# Patient Record
Sex: Male | Born: 1940 | Race: White | Hispanic: No | Marital: Married | Smoking: Never smoker
Health system: Southern US, Community
[De-identification: ages and names within clinical notes are randomized; demographics above are authoritative.]

## PROBLEM LIST (undated history)

## (undated) DIAGNOSIS — D62 Acute posthemorrhagic anemia: Secondary | ICD-10-CM

## (undated) DIAGNOSIS — I219 Acute myocardial infarction, unspecified: Secondary | ICD-10-CM

## (undated) DIAGNOSIS — E785 Hyperlipidemia, unspecified: Secondary | ICD-10-CM

## (undated) DIAGNOSIS — D649 Anemia, unspecified: Secondary | ICD-10-CM

## (undated) DIAGNOSIS — J45901 Unspecified asthma with (acute) exacerbation: Secondary | ICD-10-CM

## (undated) DIAGNOSIS — I472 Ventricular tachycardia: Secondary | ICD-10-CM

## (undated) DIAGNOSIS — I25110 Atherosclerotic heart disease of native coronary artery with unstable angina pectoris: Secondary | ICD-10-CM

## (undated) DIAGNOSIS — I469 Cardiac arrest, cause unspecified: Secondary | ICD-10-CM

## (undated) DIAGNOSIS — K439 Ventral hernia without obstruction or gangrene: Secondary | ICD-10-CM

## (undated) DIAGNOSIS — I255 Ischemic cardiomyopathy: Secondary | ICD-10-CM

## (undated) DIAGNOSIS — F0280 Dementia in other diseases classified elsewhere without behavioral disturbance: Secondary | ICD-10-CM

## (undated) DIAGNOSIS — J441 Chronic obstructive pulmonary disease with (acute) exacerbation: Secondary | ICD-10-CM

## (undated) DIAGNOSIS — K219 Gastro-esophageal reflux disease without esophagitis: Secondary | ICD-10-CM

## (undated) DIAGNOSIS — S2243XA Multiple fractures of ribs, bilateral, initial encounter for closed fracture: Secondary | ICD-10-CM

## (undated) DIAGNOSIS — I4721 Torsades de pointes: Secondary | ICD-10-CM

## (undated) DIAGNOSIS — K922 Gastrointestinal hemorrhage, unspecified: Secondary | ICD-10-CM

## (undated) DIAGNOSIS — F5104 Psychophysiologic insomnia: Secondary | ICD-10-CM

## (undated) DIAGNOSIS — C649 Malignant neoplasm of unspecified kidney, except renal pelvis: Secondary | ICD-10-CM

## (undated) DIAGNOSIS — N179 Acute kidney failure, unspecified: Secondary | ICD-10-CM

## (undated) DIAGNOSIS — I639 Cerebral infarction, unspecified: Secondary | ICD-10-CM

## (undated) DIAGNOSIS — F419 Anxiety disorder, unspecified: Secondary | ICD-10-CM

## (undated) DIAGNOSIS — I2510 Atherosclerotic heart disease of native coronary artery without angina pectoris: Secondary | ICD-10-CM

## (undated) DIAGNOSIS — I429 Cardiomyopathy, unspecified: Secondary | ICD-10-CM

## (undated) HISTORY — DX: Chronic obstructive pulmonary disease with (acute) exacerbation: J44.1

## (undated) HISTORY — DX: Anemia, unspecified: D64.9

## (undated) HISTORY — DX: Acute posthemorrhagic anemia: D62

## (undated) HISTORY — DX: Acute myocardial infarction, unspecified: I21.9

## (undated) HISTORY — DX: Anxiety disorder, unspecified: F41.9

## (undated) HISTORY — DX: Malignant neoplasm of unspecified kidney, except renal pelvis: C64.9

## (undated) HISTORY — DX: Cardiac arrest, cause unspecified: I46.9

## (undated) HISTORY — PX: INGUINAL HERNIA REPAIR: SUR1180

## (undated) HISTORY — DX: Cerebral infarction, unspecified: I63.9

## (undated) HISTORY — DX: Psychophysiologic insomnia: F51.04

## (undated) HISTORY — DX: Torsades de pointes: I47.21

## (undated) HISTORY — PX: KIDNEY SURGERY: SHX687

## (undated) HISTORY — DX: Gastro-esophageal reflux disease without esophagitis: K21.9

## (undated) HISTORY — DX: Gastrointestinal hemorrhage, unspecified: K92.2

## (undated) HISTORY — DX: Multiple fractures of ribs, bilateral, initial encounter for closed fracture: S22.43XA

## (undated) HISTORY — DX: Acute kidney failure, unspecified: N17.9

## (undated) HISTORY — DX: Atherosclerotic heart disease of native coronary artery with unstable angina pectoris: I25.110

## (undated) HISTORY — DX: Ventricular tachycardia: I47.2

## (undated) HISTORY — DX: Dementia in other diseases classified elsewhere, unspecified severity, without behavioral disturbance, psychotic disturbance, mood disturbance, and anxiety: F02.80

## (undated) HISTORY — DX: Unspecified asthma with (acute) exacerbation: J45.901

## (undated) HISTORY — DX: Ischemic cardiomyopathy: I25.5

---

## 2002-05-26 ENCOUNTER — Inpatient Hospital Stay (HOSPITAL_COMMUNITY): Admission: AD | Admit: 2002-05-26 | Discharge: 2002-05-28 | Payer: Self-pay | Admitting: Cardiology

## 2002-05-27 ENCOUNTER — Encounter: Payer: Self-pay | Admitting: Cardiology

## 2015-05-15 DIAGNOSIS — E785 Hyperlipidemia, unspecified: Secondary | ICD-10-CM | POA: Insufficient documentation

## 2015-05-15 HISTORY — DX: Hyperlipidemia, unspecified: E78.5

## 2016-07-29 ENCOUNTER — Other Ambulatory Visit: Payer: Self-pay

## 2016-07-29 MED ORDER — METOPROLOL SUCCINATE ER 50 MG PO TB24: 50.0000 mg | ORAL_TABLET | Freq: Every day | ORAL | 3 refills | Status: DC

## 2016-08-16 ENCOUNTER — Other Ambulatory Visit: Payer: Self-pay

## 2016-08-16 MED ORDER — METOPROLOL SUCCINATE ER 50 MG PO TB24: 50.0000 mg | ORAL_TABLET | Freq: Every day | ORAL | 3 refills | Status: DC

## 2016-09-20 ENCOUNTER — Inpatient Hospital Stay (HOSPITAL_COMMUNITY)
Admission: AD | Admit: 2016-09-20 | Discharge: 2016-09-26 | DRG: 280 | Disposition: A | Discharge status: Home / Self Care | Payer: Medicare Other | Source: Other Acute Inpatient Hospital | Attending: Internal Medicine | Admitting: Internal Medicine

## 2016-09-20 ENCOUNTER — Inpatient Hospital Stay (HOSPITAL_COMMUNITY): Payer: Medicare Other

## 2016-09-20 ENCOUNTER — Inpatient Hospital Stay (HOSPITAL_COMMUNITY): Payer: Medicare Other | Admitting: Anesthesiology

## 2016-09-20 ENCOUNTER — Encounter (HOSPITAL_COMMUNITY): Payer: Self-pay | Admitting: Internal Medicine

## 2016-09-20 ENCOUNTER — Encounter (HOSPITAL_COMMUNITY)
Admission: AD | Disposition: A | Discharge status: Home / Self Care | Payer: Self-pay | Source: Other Acute Inpatient Hospital | Attending: Internal Medicine

## 2016-09-20 DIAGNOSIS — I214 Non-ST elevation (NSTEMI) myocardial infarction: Principal | ICD-10-CM

## 2016-09-20 DIAGNOSIS — J9600 Acute respiratory failure, unspecified whether with hypoxia or hypercapnia: Secondary | ICD-10-CM | POA: Diagnosis not present

## 2016-09-20 DIAGNOSIS — R339 Retention of urine, unspecified: Secondary | ICD-10-CM | POA: Diagnosis not present

## 2016-09-20 DIAGNOSIS — I4901 Ventricular fibrillation: Secondary | ICD-10-CM | POA: Diagnosis present

## 2016-09-20 DIAGNOSIS — I255 Ischemic cardiomyopathy: Secondary | ICD-10-CM | POA: Diagnosis present

## 2016-09-20 DIAGNOSIS — R9431 Abnormal electrocardiogram [ECG] [EKG]: Secondary | ICD-10-CM | POA: Diagnosis not present

## 2016-09-20 DIAGNOSIS — R0603 Acute respiratory distress: Secondary | ICD-10-CM | POA: Diagnosis not present

## 2016-09-20 DIAGNOSIS — G931 Anoxic brain damage, not elsewhere classified: Secondary | ICD-10-CM | POA: Diagnosis not present

## 2016-09-20 DIAGNOSIS — I252 Old myocardial infarction: Secondary | ICD-10-CM | POA: Diagnosis not present

## 2016-09-20 DIAGNOSIS — Z9861 Coronary angioplasty status: Secondary | ICD-10-CM

## 2016-09-20 DIAGNOSIS — Z7901 Long term (current) use of anticoagulants: Secondary | ICD-10-CM

## 2016-09-20 DIAGNOSIS — S2243XA Multiple fractures of ribs, bilateral, initial encounter for closed fracture: Secondary | ICD-10-CM | POA: Diagnosis present

## 2016-09-20 DIAGNOSIS — I472 Ventricular tachycardia: Secondary | ICD-10-CM | POA: Diagnosis present

## 2016-09-20 DIAGNOSIS — K449 Diaphragmatic hernia without obstruction or gangrene: Secondary | ICD-10-CM | POA: Diagnosis not present

## 2016-09-20 DIAGNOSIS — I959 Hypotension, unspecified: Secondary | ICD-10-CM | POA: Diagnosis present

## 2016-09-20 DIAGNOSIS — I4430 Unspecified atrioventricular block: Secondary | ICD-10-CM | POA: Diagnosis present

## 2016-09-20 DIAGNOSIS — Z452 Encounter for adjustment and management of vascular access device: Secondary | ICD-10-CM

## 2016-09-20 DIAGNOSIS — I361 Nonrheumatic tricuspid (valve) insufficiency: Secondary | ICD-10-CM | POA: Diagnosis not present

## 2016-09-20 DIAGNOSIS — I480 Paroxysmal atrial fibrillation: Secondary | ICD-10-CM | POA: Diagnosis not present

## 2016-09-20 DIAGNOSIS — Z978 Presence of other specified devices: Secondary | ICD-10-CM

## 2016-09-20 DIAGNOSIS — I2510 Atherosclerotic heart disease of native coronary artery without angina pectoris: Secondary | ICD-10-CM

## 2016-09-20 DIAGNOSIS — J9601 Acute respiratory failure with hypoxia: Secondary | ICD-10-CM | POA: Diagnosis not present

## 2016-09-20 DIAGNOSIS — Z8711 Personal history of peptic ulcer disease: Secondary | ICD-10-CM

## 2016-09-20 DIAGNOSIS — E785 Hyperlipidemia, unspecified: Secondary | ICD-10-CM | POA: Diagnosis not present

## 2016-09-20 DIAGNOSIS — I25110 Atherosclerotic heart disease of native coronary artery with unstable angina pectoris: Secondary | ICD-10-CM | POA: Diagnosis present

## 2016-09-20 DIAGNOSIS — Z8249 Family history of ischemic heart disease and other diseases of the circulatory system: Secondary | ICD-10-CM

## 2016-09-20 DIAGNOSIS — I441 Atrioventricular block, second degree: Secondary | ICD-10-CM | POA: Diagnosis not present

## 2016-09-20 DIAGNOSIS — K922 Gastrointestinal hemorrhage, unspecified: Secondary | ICD-10-CM | POA: Diagnosis not present

## 2016-09-20 DIAGNOSIS — N179 Acute kidney failure, unspecified: Secondary | ICD-10-CM | POA: Diagnosis present

## 2016-09-20 DIAGNOSIS — K7200 Acute and subacute hepatic failure without coma: Secondary | ICD-10-CM | POA: Diagnosis present

## 2016-09-20 DIAGNOSIS — D649 Anemia, unspecified: Secondary | ICD-10-CM | POA: Diagnosis present

## 2016-09-20 DIAGNOSIS — J9382 Other air leak: Secondary | ICD-10-CM | POA: Diagnosis not present

## 2016-09-20 DIAGNOSIS — Z9889 Other specified postprocedural states: Secondary | ICD-10-CM

## 2016-09-20 DIAGNOSIS — R570 Cardiogenic shock: Secondary | ICD-10-CM | POA: Diagnosis present

## 2016-09-20 DIAGNOSIS — R001 Bradycardia, unspecified: Secondary | ICD-10-CM | POA: Diagnosis present

## 2016-09-20 DIAGNOSIS — I4721 Torsades de pointes: Secondary | ICD-10-CM

## 2016-09-20 DIAGNOSIS — I509 Heart failure, unspecified: Secondary | ICD-10-CM | POA: Diagnosis not present

## 2016-09-20 DIAGNOSIS — I469 Cardiac arrest, cause unspecified: Secondary | ICD-10-CM

## 2016-09-20 DIAGNOSIS — J690 Pneumonitis due to inhalation of food and vomit: Secondary | ICD-10-CM | POA: Diagnosis not present

## 2016-09-20 DIAGNOSIS — I4892 Unspecified atrial flutter: Secondary | ICD-10-CM | POA: Diagnosis present

## 2016-09-20 DIAGNOSIS — S20212A Contusion of left front wall of thorax, initial encounter: Secondary | ICD-10-CM | POA: Diagnosis present

## 2016-09-20 DIAGNOSIS — J9602 Acute respiratory failure with hypercapnia: Secondary | ICD-10-CM | POA: Diagnosis not present

## 2016-09-20 DIAGNOSIS — D62 Acute posthemorrhagic anemia: Secondary | ICD-10-CM | POA: Diagnosis not present

## 2016-09-20 DIAGNOSIS — Z4659 Encounter for fitting and adjustment of other gastrointestinal appliance and device: Secondary | ICD-10-CM

## 2016-09-20 DIAGNOSIS — Z806 Family history of leukemia: Secondary | ICD-10-CM

## 2016-09-20 HISTORY — DX: Hyperlipidemia, unspecified: E78.5

## 2016-09-20 HISTORY — DX: Non-ST elevation (NSTEMI) myocardial infarction: I21.4

## 2016-09-20 HISTORY — DX: Cardiomyopathy, unspecified: I42.9

## 2016-09-20 HISTORY — DX: Ventral hernia without obstruction or gangrene: K43.9

## 2016-09-20 HISTORY — DX: Atherosclerotic heart disease of native coronary artery without angina pectoris: I25.10

## 2016-09-20 HISTORY — PX: LEFT HEART CATH AND CORONARY ANGIOGRAPHY: CATH118249

## 2016-09-20 LAB — COMPREHENSIVE METABOLIC PANEL
ALBUMIN: 3.6 g/dL (ref 3.5–5.0)
ALBUMIN: 3.2 g/dL — AB (ref 3.5–5.0)
ALK PHOS: 77 U/L (ref 38–126)
ALT: 27 U/L (ref 17–63)
ALT: 89 U/L — ABNORMAL HIGH (ref 17–63)
ANION GAP: 8 (ref 5–15)
ANION GAP: 8 (ref 5–15)
AST: 72 U/L — ABNORMAL HIGH (ref 15–41)
AST: 187 U/L — ABNORMAL HIGH (ref 15–41)
Alkaline Phosphatase: 74 U/L (ref 38–126)
BILIRUBIN TOTAL: 1.1 mg/dL (ref 0.3–1.2)
BILIRUBIN TOTAL: 1.2 mg/dL (ref 0.3–1.2)
BUN: 14 mg/dL (ref 6–20)
BUN: 15 mg/dL (ref 6–20)
CALCIUM: 8.7 mg/dL — AB (ref 8.9–10.3)
CHLORIDE: 110 mmol/L (ref 101–111)
CO2: 24 mmol/L (ref 22–32)
CO2: 21 mmol/L — ABNORMAL LOW (ref 22–32)
Calcium: 7.6 mg/dL — ABNORMAL LOW (ref 8.9–10.3)
Chloride: 107 mmol/L (ref 101–111)
Creatinine, Ser: 1.09 mg/dL (ref 0.61–1.24)
Creatinine, Ser: 1.31 mg/dL — ABNORMAL HIGH (ref 0.61–1.24)
GFR calc Af Amer: 58 mL/min — ABNORMAL LOW (ref >60)
GFR, EST NON AFRICAN AMERICAN: 50 mL/min — AB (ref >60)
GLUCOSE: 90 mg/dL (ref 65–99)
Glucose, Bld: 146 mg/dL — ABNORMAL HIGH (ref 65–99)
POTASSIUM: 4.5 mmol/L (ref 3.5–5.1)
Potassium: 3.9 mmol/L (ref 3.5–5.1)
SODIUM: 139 mmol/L (ref 135–145)
Sodium: 139 mmol/L (ref 135–145)
TOTAL PROTEIN: 6.1 g/dL — AB (ref 6.5–8.1)
TOTAL PROTEIN: 5.9 g/dL — AB (ref 6.5–8.1)

## 2016-09-20 LAB — CBC WITH DIFFERENTIAL/PLATELET
Basophils Absolute: 0 10*3/uL (ref 0.0–0.1)
Basophils Relative: 0 %
EOS PCT: 12 %
Eosinophils Absolute: 1 10*3/uL — ABNORMAL HIGH (ref 0.0–0.7)
HCT: 42.9 % (ref 39.0–52.0)
Hemoglobin: 14.4 g/dL (ref 13.0–17.0)
LYMPHS ABS: 1.6 10*3/uL (ref 0.7–4.0)
LYMPHS PCT: 20 %
MCH: 31.8 pg (ref 26.0–34.0)
MCHC: 33.6 g/dL (ref 30.0–36.0)
MCV: 94.7 fL (ref 78.0–100.0)
Monocytes Absolute: 0.8 10*3/uL (ref 0.1–1.0)
Monocytes Relative: 10 %
Neutro Abs: 4.7 10*3/uL (ref 1.7–7.7)
Neutrophils Relative %: 58 %
PLATELETS: 193 10*3/uL (ref 150–400)
RBC: 4.53 MIL/uL (ref 4.22–5.81)
RDW: 14.4 % (ref 11.5–15.5)
WBC: 8.1 10*3/uL (ref 4.0–10.5)

## 2016-09-20 LAB — BLOOD GAS, ARTERIAL
Acid-base deficit: 6.7 mmol/L — ABNORMAL HIGH (ref 0.0–2.0)
BICARBONATE: 18 mmol/L — AB (ref 20.0–28.0)
Drawn by: 44898
FIO2: 100
LHR: 16 {breaths}/min
O2 Saturation: 99.2 %
PEEP/CPAP: 5 cmH2O
Patient temperature: 98.2
VT: 540 mL
pCO2 arterial: 34.3 mmHg (ref 32.0–48.0)
pH, Arterial: 7.339 — ABNORMAL LOW (ref 7.350–7.450)
pO2, Arterial: 298 mmHg — ABNORMAL HIGH (ref 83.0–108.0)

## 2016-09-20 LAB — CBC
HEMATOCRIT: 43.9 % (ref 39.0–52.0)
Hemoglobin: 14.6 g/dL (ref 13.0–17.0)
MCH: 32 pg (ref 26.0–34.0)
MCHC: 33.3 g/dL (ref 30.0–36.0)
MCV: 96.3 fL (ref 78.0–100.0)
Platelets: 219 10*3/uL (ref 150–400)
RBC: 4.56 MIL/uL (ref 4.22–5.81)
RDW: 14.5 % (ref 11.5–15.5)
WBC: 13.9 10*3/uL — ABNORMAL HIGH (ref 4.0–10.5)

## 2016-09-20 LAB — MRSA PCR SCREENING: MRSA by PCR: NEGATIVE

## 2016-09-20 LAB — TROPONIN I
TROPONIN I: 4.2 ng/mL — AB (ref <0.03)
TROPONIN I: 6.8 ng/mL — AB (ref <0.03)
Troponin I: 12.6 ng/mL (ref <0.03)

## 2016-09-20 LAB — PROTIME-INR
INR: 1.2
Prothrombin Time: 15.3 seconds — ABNORMAL HIGH (ref 11.4–15.2)

## 2016-09-20 LAB — ECHOCARDIOGRAM COMPLETE
Height: 68 in
Weight: 2761.92 oz

## 2016-09-20 LAB — LIPID PANEL
CHOL/HDL RATIO: 3 ratio
Cholesterol: 102 mg/dL (ref 0–200)
HDL: 34 mg/dL — AB (ref >40)
LDL CALC: 60 mg/dL (ref 0–99)
TRIGLYCERIDES: 38 mg/dL (ref <150)
VLDL: 8 mg/dL (ref 0–40)

## 2016-09-20 LAB — MAGNESIUM
MAGNESIUM: 1.8 mg/dL (ref 1.7–2.4)
MAGNESIUM: 1.7 mg/dL (ref 1.7–2.4)

## 2016-09-20 LAB — HEPARIN LEVEL (UNFRACTIONATED): Heparin Unfractionated: 0.7 IU/mL (ref 0.30–0.70)

## 2016-09-20 LAB — TSH: TSH: 1.879 u[IU]/mL (ref 0.350–4.500)

## 2016-09-20 LAB — BRAIN NATRIURETIC PEPTIDE: B Natriuretic Peptide: 556.1 pg/mL — ABNORMAL HIGH (ref 0.0–100.0)

## 2016-09-20 LAB — HEMOGLOBIN A1C
Hgb A1c MFr Bld: 6 % — ABNORMAL HIGH (ref 4.8–5.6)
Mean Plasma Glucose: 125.5 mg/dL

## 2016-09-20 SURGERY — LEFT HEART CATH AND CORONARY ANGIOGRAPHY
Anesthesia: LOCAL

## 2016-09-20 MED ORDER — VERAPAMIL HCL 2.5 MG/ML IV SOLN
INTRAVENOUS | Status: DC | PRN
  Administered 2016-09-20: 12:00:00 via INTRA_ARTERIAL

## 2016-09-20 MED ORDER — FENTANYL CITRATE (PF) 100 MCG/2ML IJ SOLN
INTRAMUSCULAR | Status: DC | PRN
  Administered 2016-09-20: 50 ug via INTRAVENOUS

## 2016-09-20 MED ORDER — MIDAZOLAM HCL 2 MG/2ML IJ SOLN
INTRAMUSCULAR | Status: AC
  Filled 2016-09-20: qty 2

## 2016-09-20 MED ORDER — LIDOCAINE HCL (PF) 1 % IJ SOLN
INTRAMUSCULAR | Status: AC
  Filled 2016-09-20: qty 30

## 2016-09-20 MED ORDER — ORAL CARE MOUTH RINSE
15.0000 mL | OROMUCOSAL | Status: DC
  Administered 2016-09-20 – 2016-09-22 (×22): 15 mL via OROMUCOSAL

## 2016-09-20 MED ORDER — ASPIRIN 81 MG PO CHEW: 81.0000 mg | CHEWABLE_TABLET | Freq: Every day | ORAL | Status: DC

## 2016-09-20 MED ORDER — ACETAMINOPHEN 325 MG PO TABS: 650.0000 mg | ORAL_TABLET | ORAL | Status: DC | PRN

## 2016-09-20 MED ORDER — PANTOPRAZOLE SODIUM 40 MG IV SOLR
40.0000 mg | Freq: Every day | INTRAVENOUS | Status: DC
  Administered 2016-09-20 – 2016-09-21 (×2): 40 mg via INTRAVENOUS
  Filled 2016-09-20 (×2): qty 40

## 2016-09-20 MED ORDER — ORAL CARE MOUTH RINSE: 15.0000 mL | Freq: Four times a day (QID) | OROMUCOSAL | Status: DC

## 2016-09-20 MED ORDER — SODIUM CHLORIDE 0.9 % IV SOLN: INTRAVENOUS | Status: DC

## 2016-09-20 MED ORDER — ETOMIDATE 2 MG/ML IV SOLN
INTRAVENOUS | Status: DC | PRN
  Administered 2016-09-20: 8 mg via INTRAVENOUS

## 2016-09-20 MED ORDER — VERAPAMIL HCL 2.5 MG/ML IV SOLN
INTRAVENOUS | Status: AC
  Filled 2016-09-20: qty 2

## 2016-09-20 MED ORDER — AMIODARONE HCL IN DEXTROSE 360-4.14 MG/200ML-% IV SOLN
60.0000 mg/h | INTRAVENOUS | Status: AC
  Administered 2016-09-20: 60 mg/h via INTRAVENOUS

## 2016-09-20 MED ORDER — ATORVASTATIN CALCIUM 80 MG PO TABS
80.0000 mg | ORAL_TABLET | Freq: Every day | ORAL | Status: DC
  Administered 2016-09-21 – 2016-09-25 (×5): 80 mg via ORAL
  Filled 2016-09-20 (×5): qty 1

## 2016-09-20 MED ORDER — SODIUM CHLORIDE 0.9% FLUSH
3.0000 mL | Freq: Two times a day (BID) | INTRAVENOUS | Status: DC
  Administered 2016-09-20 – 2016-09-24 (×3): 3 mL via INTRAVENOUS
  Administered 2016-09-24 – 2016-09-25 (×2): 10 mL via INTRAVENOUS
  Administered 2016-09-25: 3 mL via INTRAVENOUS

## 2016-09-20 MED ORDER — LISINOPRIL 5 MG PO TABS: 5.0000 mg | ORAL_TABLET | Freq: Every day | ORAL | Status: DC

## 2016-09-20 MED ORDER — NITROGLYCERIN 0.4 MG SL SUBL: 0.4000 mg | SUBLINGUAL_TABLET | SUBLINGUAL | Status: DC | PRN

## 2016-09-20 MED ORDER — HEPARIN SODIUM (PORCINE) 1000 UNIT/ML IJ SOLN
INTRAMUSCULAR | Status: DC | PRN
  Administered 2016-09-20: 4000 [IU] via INTRAVENOUS

## 2016-09-20 MED ORDER — SODIUM CHLORIDE 0.9 % WEIGHT BASED INFUSION
1.0000 mL/kg/h | INTRAVENOUS | Status: AC
  Administered 2016-09-20: 1 mL/kg/h via INTRAVENOUS

## 2016-09-20 MED ORDER — FENTANYL BOLUS VIA INFUSION
25.0000 ug | INTRAVENOUS | Status: DC | PRN
  Filled 2016-09-20: qty 25

## 2016-09-20 MED ORDER — SODIUM CHLORIDE 0.9 % IV SOLN
INTRAVENOUS | Status: AC | PRN
  Administered 2016-09-20: 2 mg/h via INTRAVENOUS

## 2016-09-20 MED ORDER — SODIUM CHLORIDE 0.9% FLUSH: 3.0000 mL | INTRAVENOUS | Status: DC | PRN

## 2016-09-20 MED ORDER — CHLORHEXIDINE GLUCONATE CLOTH 2 % EX PADS
6.0000 | MEDICATED_PAD | Freq: Every day | CUTANEOUS | Status: DC
  Administered 2016-09-21 – 2016-09-22 (×2): 6 via TOPICAL

## 2016-09-20 MED ORDER — ONDANSETRON HCL 4 MG/2ML IJ SOLN: 4.0000 mg | Freq: Four times a day (QID) | INTRAMUSCULAR | Status: DC | PRN

## 2016-09-20 MED ORDER — ASPIRIN EC 81 MG PO TBEC
81.0000 mg | DELAYED_RELEASE_TABLET | Freq: Every day | ORAL | Status: DC
  Filled 2016-09-20: qty 1

## 2016-09-20 MED ORDER — SODIUM CHLORIDE 0.9% FLUSH
3.0000 mL | Freq: Two times a day (BID) | INTRAVENOUS | Status: DC
  Administered 2016-09-20: 3 mL via INTRAVENOUS

## 2016-09-20 MED ORDER — HEPARIN (PORCINE) IN NACL 2-0.9 UNIT/ML-% IJ SOLN
INTRAMUSCULAR | Status: AC | PRN
  Administered 2016-09-20: 1000 mL via INTRA_ARTERIAL

## 2016-09-20 MED ORDER — IOPAMIDOL (ISOVUE-370) INJECTION 76%
INTRAVENOUS | Status: DC | PRN
  Administered 2016-09-20: 110 mL via INTRA_ARTERIAL

## 2016-09-20 MED ORDER — SODIUM CHLORIDE 0.9 % IV SOLN: 250.0000 mL | INTRAVENOUS | Status: DC | PRN

## 2016-09-20 MED ORDER — LOSARTAN POTASSIUM 50 MG PO TABS
50.0000 mg | ORAL_TABLET | Freq: Every day | ORAL | Status: DC
  Administered 2016-09-20: 50 mg via ORAL
  Filled 2016-09-20: qty 1

## 2016-09-20 MED ORDER — MIDAZOLAM HCL 2 MG/2ML IJ SOLN
INTRAMUSCULAR | Status: DC | PRN
  Administered 2016-09-20 (×2): 2 mg via INTRAVENOUS

## 2016-09-20 MED ORDER — HEPARIN (PORCINE) IN NACL 2-0.9 UNIT/ML-% IJ SOLN
INTRAMUSCULAR | Status: AC
  Filled 2016-09-20: qty 1000

## 2016-09-20 MED ORDER — SODIUM CHLORIDE 0.9% FLUSH
10.0000 mL | Freq: Two times a day (BID) | INTRAVENOUS | Status: DC
  Administered 2016-09-21 – 2016-09-25 (×7): 10 mL

## 2016-09-20 MED ORDER — MIDAZOLAM HCL 2 MG/2ML IJ SOLN: 1.0000 mg | INTRAMUSCULAR | Status: DC | PRN

## 2016-09-20 MED ORDER — SUCCINYLCHOLINE CHLORIDE 20 MG/ML IJ SOLN
INTRAMUSCULAR | Status: DC | PRN
  Administered 2016-09-20: 80 mg via INTRAVENOUS

## 2016-09-20 MED ORDER — LIDOCAINE HCL (PF) 1 % IJ SOLN
INTRAMUSCULAR | Status: DC | PRN
  Administered 2016-09-20: 2 mL

## 2016-09-20 MED ORDER — AMIODARONE HCL IN DEXTROSE 360-4.14 MG/200ML-% IV SOLN
30.0000 mg/h | INTRAVENOUS | Status: DC
  Administered 2016-09-20 – 2016-09-21 (×2): 30 mg/h via INTRAVENOUS
  Filled 2016-09-20 (×2): qty 200

## 2016-09-20 MED ORDER — FENTANYL CITRATE (PF) 100 MCG/2ML IJ SOLN
INTRAMUSCULAR | Status: AC
  Filled 2016-09-20: qty 2

## 2016-09-20 MED ORDER — IOPAMIDOL (ISOVUE-370) INJECTION 76%
INTRAVENOUS | Status: AC
  Filled 2016-09-20: qty 50

## 2016-09-20 MED ORDER — FENTANYL CITRATE (PF) 100 MCG/2ML IJ SOLN: 50.0000 ug | INTRAMUSCULAR | Status: DC | PRN

## 2016-09-20 MED ORDER — PHENYLEPHRINE HCL 10 MG/ML IJ SOLN
INTRAMUSCULAR | Status: DC | PRN
  Administered 2016-09-20: 80 ug via INTRAVENOUS
  Administered 2016-09-20: 120 ug via INTRAVENOUS

## 2016-09-20 MED ORDER — IOPAMIDOL (ISOVUE-370) INJECTION 76%
INTRAVENOUS | Status: AC
  Filled 2016-09-20: qty 100

## 2016-09-20 MED ORDER — CHLORHEXIDINE GLUCONATE 0.12% ORAL RINSE (MEDLINE KIT)
15.0000 mL | Freq: Two times a day (BID) | OROMUCOSAL | Status: DC
  Administered 2016-09-20: 15 mL via OROMUCOSAL

## 2016-09-20 MED ORDER — SODIUM CHLORIDE 0.9 % IV BOLUS (SEPSIS)
500.0000 mL | Freq: Once | INTRAVENOUS | Status: AC
  Administered 2016-09-20: 500 mL via INTRAVENOUS

## 2016-09-20 MED ORDER — SODIUM CHLORIDE 0.9 % IV SOLN
2.0000 mg/h | INTRAVENOUS | Status: DC
  Administered 2016-09-20: 2 mg/h via INTRAVENOUS
  Administered 2016-09-20: 1 mg/h via INTRAVENOUS
  Filled 2016-09-20 (×3): qty 10

## 2016-09-20 MED ORDER — NOREPINEPHRINE BITARTRATE 1 MG/ML IV SOLN
0.0000 ug/min | INTRAVENOUS | Status: DC
  Administered 2016-09-20: 5 ug/min via INTRAVENOUS
  Filled 2016-09-20: qty 16

## 2016-09-20 MED ORDER — FENTANYL CITRATE (PF) 100 MCG/2ML IJ SOLN
50.0000 ug | Freq: Once | INTRAMUSCULAR | Status: AC
  Administered 2016-09-20: 50 ug via INTRAVENOUS

## 2016-09-20 MED ORDER — SODIUM CHLORIDE 0.9% FLUSH: 10.0000 mL | INTRAVENOUS | Status: DC | PRN

## 2016-09-20 MED ORDER — METOPROLOL SUCCINATE ER 50 MG PO TB24
50.0000 mg | ORAL_TABLET | Freq: Every day | ORAL | Status: DC
  Administered 2016-09-20: 50 mg via ORAL
  Filled 2016-09-20: qty 1

## 2016-09-20 MED ORDER — OXYCODONE HCL 5 MG PO TABS: 5.0000 mg | ORAL_TABLET | ORAL | Status: DC | PRN

## 2016-09-20 MED ORDER — HEPARIN SODIUM (PORCINE) 1000 UNIT/ML IJ SOLN
INTRAMUSCULAR | Status: AC
  Filled 2016-09-20: qty 1

## 2016-09-20 MED ORDER — EPHEDRINE SULFATE 50 MG/ML IJ SOLN
INTRAMUSCULAR | Status: DC | PRN
  Administered 2016-09-20: 10 mg via INTRAVENOUS
  Administered 2016-09-20: 15 mg via INTRAVENOUS

## 2016-09-20 MED ORDER — CHLORHEXIDINE GLUCONATE 0.12% ORAL RINSE (MEDLINE KIT)
15.0000 mL | Freq: Two times a day (BID) | OROMUCOSAL | Status: DC
  Administered 2016-09-20 – 2016-09-22 (×4): 15 mL via OROMUCOSAL

## 2016-09-20 MED ORDER — PROPOFOL 1000 MG/100ML IV EMUL
5.0000 ug/kg/min | INTRAVENOUS | Status: DC
  Filled 2016-09-20: qty 100

## 2016-09-20 MED ORDER — CLOPIDOGREL BISULFATE 75 MG PO TABS
75.0000 mg | ORAL_TABLET | Freq: Every day | ORAL | Status: DC
  Administered 2016-09-21 – 2016-09-25 (×5): 75 mg
  Filled 2016-09-20 (×6): qty 1

## 2016-09-20 MED ORDER — DOCUSATE SODIUM 50 MG/5ML PO LIQD
100.0000 mg | Freq: Two times a day (BID) | ORAL | Status: DC | PRN
  Filled 2016-09-20: qty 10

## 2016-09-20 MED ORDER — SODIUM CHLORIDE 0.9 % IV SOLN
INTRAVENOUS | Status: AC | PRN
  Administered 2016-09-20: 250 mL
  Administered 2016-09-20: 100 mL/h via INTRAVENOUS

## 2016-09-20 MED ORDER — HEPARIN (PORCINE) IN NACL 100-0.45 UNIT/ML-% IJ SOLN
1500.0000 [IU]/h | INTRAMUSCULAR | Status: DC
  Administered 2016-09-20 – 2016-09-21 (×2): 1000 [IU]/h via INTRAVENOUS
  Administered 2016-09-22: 1350 [IU]/h via INTRAVENOUS
  Administered 2016-09-24 – 2016-09-25 (×2): 1500 [IU]/h via INTRAVENOUS
  Filled 2016-09-20 (×6): qty 250

## 2016-09-20 MED ORDER — ASPIRIN 81 MG PO CHEW: 81.0000 mg | CHEWABLE_TABLET | ORAL | Status: DC

## 2016-09-20 MED ORDER — BISACODYL 10 MG RE SUPP: 10.0000 mg | Freq: Every day | RECTAL | Status: DC | PRN

## 2016-09-20 MED ORDER — SODIUM CHLORIDE 0.9 % IV SOLN
25.0000 ug/h | INTRAVENOUS | Status: DC
  Administered 2016-09-20: 50 ug/h via INTRAVENOUS
  Filled 2016-09-20: qty 50

## 2016-09-20 MED ORDER — HEPARIN (PORCINE) IN NACL 100-0.45 UNIT/ML-% IJ SOLN
1100.0000 [IU]/h | INTRAMUSCULAR | Status: DC
  Administered 2016-09-20: 1100 [IU]/h via INTRAVENOUS

## 2016-09-20 MED FILL — Medication: Qty: 1 | Status: AC

## 2016-09-20 SURGICAL SUPPLY — 10 items
CATH EXPO 5F FL3.5 (CATHETERS) ×1 IMPLANT
CATH EXPO 5FR FR4 (CATHETERS) ×2 IMPLANT
DEVICE RAD COMP TR BAND LRG (VASCULAR PRODUCTS) ×1 IMPLANT
GLIDESHEATH SLEND A-KIT 6F 22G (SHEATH) ×1 IMPLANT
GUIDEWIRE INQWIRE 1.5J.035X260 (WIRE) IMPLANT
INQWIRE 1.5J .035X260CM (WIRE) ×2
KIT HEART LEFT (KITS) ×2 IMPLANT
PACK CARDIAC CATHETERIZATION (CUSTOM PROCEDURE TRAY) ×2 IMPLANT
TRANSDUCER W/STOPCOCK (MISCELLANEOUS) ×2 IMPLANT
TUBING CIL FLEX 10 FLL-RA (TUBING) ×2 IMPLANT

## 2016-09-20 NOTE — Progress Notes (Signed)
[facility] for heparin Indication: chest pain/ACS  No Known Allergies  Patient Measurements: Height: 5\' 8"  (172.7 cm) Weight: 172 lb 9.9 oz (78.3 kg) IBW/kg (Calculated) : 68.4  Vital Signs: Temp: 98 F (36.7 C) (08/27 1020) Temp Source: Oral (08/27 1020) BP: 88/60 (08/27 1304) Pulse Rate: 77 (08/27 1311)   Medical History: Past Medical History:  Diagnosis Date  . CAD (coronary artery disease)   . Cardiomyopathy ([place])   . Hyperlipemia   . Ventral hernia     Assessment: 76yo male to continue heparin gtt started at outside hospital for possible ACS.  Code blue noted this am, VT>VF noted, patient taken urgently to cath. No obvious occlusion or cause for arrhythmia found. Will restart heparin tonight. Continue to follow cardiology plan.  Heparin level drawn just before above events was at the upper limit of goal, will make slight adjustment.  Goal of Therapy:  Heparin level 0.3-0.7 units/ml Monitor platelets by anticoagulation protocol: Yes   Plan:  Restart heparin at 1000 units/hr Check heparin level with am labs  Erin Hearing PharmD., BCPS Clinical Pharmacist Pager [phone] 09/20/2016 1:32 PM

## 2016-09-20 NOTE — Procedures (Signed)
Central Venous Catheter Insertion Procedure Note BAYANI RENTERIA [id number] Nov 05, 1940  Procedure: Insertion of Central Venous Catheter Indications: Drug and/or fluid administration  Procedure Details Consent: Risks of procedure as well as the alternatives and risks of each were explained to the (patient/caregiver).  Consent for procedure obtained. Time Out: Verified patient identification, verified procedure, site/side was marked, verified correct patient position, special equipment/implants available, medications/allergies/relevent history reviewed, required imaging and test results available.  Performed  Maximum sterile technique was used including antiseptics, cap, gloves, gown, hand hygiene, mask and sheet. Skin prep: Chlorhexidine; local anesthetic administered A antimicrobial bonded/coated triple lumen catheter was placed in the right subclavian vein using the Seldinger technique.  Evaluation Blood flow good Complications: No apparent complications Patient did tolerate procedure well. Chest X-ray ordered to verify placement.  CXR: pending.  Clementeen Graham 09/20/2016, 4:22 PM  Erick Colace ACNP-BC [place] Pager # [phone] OR # [phone] if no answer  Rush Farmer, M.D. [facility] Pulmonary/Critical Care Medicine. Pager: [phone]. After hours pager: [phone].

## 2016-09-20 NOTE — Progress Notes (Signed)
Abruptly developed polymorphic VT that deteriorated to VF at 1112h, required approximately 10 minutes of CPR, one defibrillator shock, epi and amio IV. Had ROSC in under 15 minutes, appeared to have purposeful movement and shouting, but was intubated for airway protection and taken emergently to the cath lab. Family notified (wife Letta Median has dementia, grandson answered phone, two f the patient's sons are on the way to the hospital).  Sanda Klein, MD, [facility] [phone] office [phone] pager

## 2016-09-20 NOTE — Progress Notes (Signed)
Unable to obtain OG tube due to hernia placement.  MD aware. cortrack order place.

## 2016-09-20 NOTE — H&P (View-Only) (Signed)
Progress Note  Patient Name: Lonnie Boyd Date of Encounter: 09/20/2016  Primary Cardiologist: Dr. Agustin Cree   Subjective   Pain improved, however still with 1/10 chest tightness (5/10 on arrival). He denies any palpitations. No prior h/o afib, to his knowledge.   Inpatient Medications    Scheduled Meds: . [START ON 09/21/2016] aspirin EC  81 mg Oral Daily  . atorvastatin  80 mg Oral q1800  . losartan  50 mg Oral Daily  . metoprolol succinate  50 mg Oral Daily   Continuous Infusions: . heparin     PRN Meds: acetaminophen, nitroGLYCERIN, ondansetron (ZOFRAN) IV   Vital Signs    Vitals:   09/20/16 0149  BP: (!) 144/71  Pulse: 63  Resp: 19  Temp: 97.6 F (36.4 C)  SpO2: 99%  Weight: 172 lb 9.6 oz (78.3 kg)  Height: 5\' 8"  (1.727 m)    Intake/Output Summary (Last 24 hours) at 09/20/16 0757 Last data filed at 09/20/16 0600  Gross per 24 hour  Intake               55 ml  Output              500 ml  Net             -445 ml   Filed Weights   09/20/16 0149  Weight: 172 lb 9.6 oz (78.3 kg)    Telemetry    Atrial fibrillation w/ CVR - Personally Reviewed  ECG    Atrial fibrillation CVR 66 bpm  - Personally Reviewed  Physical Exam   GEN: No acute distress.   Neck: No JVD Cardiac: irreguarlly irregular, no murmurs, rubs, or gallops.  Respiratory: Clear to auscultation bilaterally. GI: Soft, nontender, non-distended  MS: No edema; No deformity. Neuro:  Nonfocal  Psych: Normal affect   Labs    Chemistry Recent Labs Lab 09/20/16 0245  NA 139  K 3.9  CL 107  CO2 24  GLUCOSE 90  BUN 14  CREATININE 1.09  CALCIUM 8.7*  PROT 6.1*  ALBUMIN 3.6  AST 72*  ALT 27  ALKPHOS 77  BILITOT 1.1  GFRNONAA >60  GFRAA >60  ANIONGAP 8     Hematology Recent Labs Lab 09/20/16 0245  WBC 8.1  RBC 4.53  HGB 14.4  HCT 42.9  MCV 94.7  MCH 31.8  MCHC 33.6  RDW 14.4  PLT 193    Cardiac Enzymes Recent Labs Lab 09/20/16 0245  TROPONINI 4.20*   No results for input(s): TROPIPOC in the last 168 hours.   BNP Recent Labs Lab 09/20/16 0245  BNP 556.1*     DDimer No results for input(s): DDIMER in the last 168 hours.   Radiology    No results found.  Cardiac Studies   2D echo - pending  LHC- pending   Patient Profile     76 y.o. male with a h/o CAD s/p PTCA + DES to the prox LAD and Lcx in 2011, ICM (reported in Epic, no echo on file) and DLD, admitted for NSTEMI and new onset atrial fibrillation.  Assessment & Plan    1. CAD/ NSTEMI: per Care Everywhere, pt with prior MI in 2011 with PCI + DES to LAD and LCx. Now with recurrent CP and elevated troponin c/w NSTEMI. Initial troponin 4.20. On IV heparin with plans for [facility] +/- PCI. Continue IV heparin, ASA, high dose Lipitor, metoprolol and losartan. Check FLP for assessment of LDL.   2. Atrial Fibrillation:  New onset, rate is controled. His CHA2DS2 VASc score is at least 4 for HF, age >20 and vascular disease. This patients CHA2DS2-VASc Score and unadjusted Ischemic Stroke Rate (% per year) is equal to 4.8 % stroke rate/year from a score of 4. He is on IV heparin. We will need to start [facility] post cath. Continue BB therapy with metoprolol. 2D echo pending. K is WNL. TSH normal.  3. DLD: check FLP. Lipitor 80 mg ordered. Goal LDL <70 mg/dL.    Signed, Lyda Jester, PA-C  09/20/2016, 7:57 AM    I have seen and examined the patient along with Lyda Jester, PA-C.  I have reviewed the chart, notes and new data.  I agree with PA's note.  Key new complaints: Minimal chest discomfort at rest at this time, denies dyspnea lying fully flat in bed Key examination changes: Irregular rhythm, no overt signs of hypervolemia, no gallop, normal distal pulses Key new findings / data: Atrial fibrillation with controlled ventricular response, prominent anterolateral T-wave inversion suggestive of ischemia in the LAD territory, peak troponin 4. Normal renal function  PLAN: Coronary  angiography and possible PCI/stent today. This procedure has been fully reviewed with the patient and written informed consent has been obtained. Currently on intravenous heparin, plan to switch to oral anticoagulant at discharge. Choice of anticoagulant may hinge on whether or not he has a revascularization and the choice of antiplatelet agent. Continue beta blocker for rate control and non-STEMI. Reevaluate left ventricular ejection fraction and left atrial size by echo.  Sanda Klein, MD, [place] [phone] 09/20/2016, 8:46 AM

## 2016-09-20 NOTE — Procedures (Signed)
Arterial Catheter Insertion Procedure Note Lonnie Boyd [id number] [birth year]  Procedure: Insertion of Arterial Catheter  Indications: Blood pressure monitoring and Frequent blood sampling  Procedure Details Consent: Risks of procedure as well as the alternatives and risks of each were explained to the (patient/caregiver).  Consent for procedure obtained. Time Out: Verified patient identification, verified procedure, site/side was marked, verified correct patient position, special equipment/implants available, medications/allergies/relevent history reviewed, required imaging and test results available.  Performed  Maximum sterile technique was used including antiseptics, cap, gloves, gown, hand hygiene, mask and sheet. Skin prep: Chlorhexidine; local anesthetic administered 20 gauge catheter was inserted into right femoral artery using the Seldinger technique.  Arterial waveform on monitor when pressure line zeroed.    Evaluation Blood flow good; BP tracing good. Complications: No apparent complications.   Procedure performed under direct supervision of Dr. Nelda Marseille and with ultrasound guidance for real time vessel cannulation.      Lonnie Gens, NP-C [place] Pulmonary & Critical Care Pgr: [phone] or if no answer [phone] 09/20/2016, 4:22 PM  Rush Farmer, M.D. [facility] Pulmonary/Critical Care Medicine. Pager: [phone]. After hours pager: [phone].

## 2016-09-20 NOTE — Progress Notes (Signed)
Progress Note  Patient Name: Lonnie Boyd Date of Encounter: 09/20/2016  Primary Cardiologist: Dr. Agustin Cree   Subjective   Pain improved, however still with 1/10 chest tightness (5/10 on arrival). He denies any palpitations. No prior h/o afib, to his knowledge.   Inpatient Medications    Scheduled Meds: . [START ON 09/21/2016] aspirin EC  81 mg Oral Daily  . atorvastatin  80 mg Oral q1800  . losartan  50 mg Oral Daily  . metoprolol succinate  50 mg Oral Daily   Continuous Infusions: . heparin     PRN Meds: acetaminophen, nitroGLYCERIN, ondansetron (ZOFRAN) IV   Vital Signs    Vitals:   09/20/16 0149  BP: (!) 144/71  Pulse: 63  Resp: 19  Temp: 97.6 F (36.4 C)  SpO2: 99%  Weight: 172 lb 9.6 oz (78.3 kg)  Height: 5\' 8"  (1.727 m)    Intake/Output Summary (Last 24 hours) at 09/20/16 0757 Last data filed at 09/20/16 0600  Gross per 24 hour  Intake               55 ml  Output              500 ml  Net             -445 ml   Filed Weights   09/20/16 0149  Weight: 172 lb 9.6 oz (78.3 kg)    Telemetry    Atrial fibrillation w/ CVR - Personally Reviewed  ECG    Atrial fibrillation CVR 66 bpm  - Personally Reviewed  Physical Exam   GEN: No acute distress.   Neck: No JVD Cardiac: irreguarlly irregular, no murmurs, rubs, or gallops.  Respiratory: Clear to auscultation bilaterally. GI: Soft, nontender, non-distended  MS: No edema; No deformity. Neuro:  Nonfocal  Psych: Normal affect   Labs    Chemistry Recent Labs Lab 09/20/16 0245  NA 139  K 3.9  CL 107  CO2 24  GLUCOSE 90  BUN 14  CREATININE 1.09  CALCIUM 8.7*  PROT 6.1*  ALBUMIN 3.6  AST 72*  ALT 27  ALKPHOS 77  BILITOT 1.1  GFRNONAA >60  GFRAA >60  ANIONGAP 8     Hematology Recent Labs Lab 09/20/16 0245  WBC 8.1  RBC 4.53  HGB 14.4  HCT 42.9  MCV 94.7  MCH 31.8  MCHC 33.6  RDW 14.4  PLT 193    Cardiac Enzymes Recent Labs Lab 09/20/16 0245  TROPONINI 4.20*   No results for input(s): TROPIPOC in the last 168 hours.   BNP Recent Labs Lab 09/20/16 0245  BNP 556.1*     DDimer No results for input(s): DDIMER in the last 168 hours.   Radiology    No results found.  Cardiac Studies   2D echo - pending  LHC- pending   Patient Profile     76 y.o. male with a h/o CAD s/p PTCA + DES to the prox LAD and Lcx in 2011, ICM (reported in Epic, no echo on file) and DLD, admitted for NSTEMI and new onset atrial fibrillation.  Assessment & Plan    1. CAD/ NSTEMI: per Care Everywhere, pt with prior MI in 2011 with PCI + DES to LAD and LCx. Now with recurrent CP and elevated troponin c/w NSTEMI. Initial troponin 4.20. On IV heparin with plans for [facility] +/- PCI. Continue IV heparin, ASA, high dose Lipitor, metoprolol and losartan. Check FLP for assessment of LDL.   2. Atrial Fibrillation:  New onset, rate is controled. His CHA2DS2 VASc score is at least 4 for HF, age >1 and vascular disease. This patients CHA2DS2-VASc Score and unadjusted Ischemic Stroke Rate (% per year) is equal to 4.8 % stroke rate/year from a score of 4. He is on IV heparin. We will need to start [facility] post cath. Continue BB therapy with metoprolol. 2D echo pending. K is WNL. TSH normal.  3. DLD: check FLP. Lipitor 80 mg ordered. Goal LDL <70 mg/dL.    Signed, Lyda Jester, PA-C  09/20/2016, 7:57 AM    I have seen and examined the patient along with Lyda Jester, PA-C.  I have reviewed the chart, notes and new data.  I agree with PA's note.  Key new complaints: Minimal chest discomfort at rest at this time, denies dyspnea lying fully flat in bed Key examination changes: Irregular rhythm, no overt signs of hypervolemia, no gallop, normal distal pulses Key new findings / data: Atrial fibrillation with controlled ventricular response, prominent anterolateral T-wave inversion suggestive of ischemia in the LAD territory, peak troponin 4. Normal renal function  PLAN: Coronary  angiography and possible PCI/stent today. This procedure has been fully reviewed with the patient and written informed consent has been obtained. Currently on intravenous heparin, plan to switch to oral anticoagulant at discharge. Choice of anticoagulant may hinge on whether or not he has a revascularization and the choice of antiplatelet agent. Continue beta blocker for rate control and non-STEMI. Reevaluate left ventricular ejection fraction and left atrial size by echo.  Sanda Klein, MD, [place] [phone] 09/20/2016, 8:46 AM

## 2016-09-20 NOTE — Progress Notes (Signed)
[place] Progress Note Patient Name: REMBERTO LIENHARD DOB: 1940-03-06 MRN: [id number]   Date of Service  09/20/2016  HPI/Events of Note  Cardiac arrest, on vent, now with escalating doses of levophed; needs central access  eICU Interventions  Discussed with on ground team who will evaluate for CVL     Intervention Category Intermediate Interventions: Hypotension - evaluation and management  Simonne Maffucci 09/20/2016, 3:43 PM

## 2016-09-20 NOTE — Consult Note (Signed)
PULMONARY / CRITICAL CARE MEDICINE   Name: Lonnie Boyd MRN: [id number] DOB: 09/20/1940    ADMISSION DATE:  09/20/2016 CONSULTATION DATE:  09/20/2016  REFERRING MD:  Dr. Sallyanne Kuster  CHIEF COMPLAINT:  Cardiac arrest  HISTORY OF PRESENT ILLNESS:  HPI obtained from medical chart review as patient is intubated and sedated. 76 year old male with PMH of CAD with prior MI and DLD admitted on 8/27 with NSTEMI and new onset afib.  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   Patient had complained of dull 6/10 chest pressure that started on the morning of 8/26.  He took aspirin and went to church and pain subsided but returned at dinner with associated diaphoresis, dyspnea, and nausea.  In the ED, troponin was 2.58 -> 4.2 and ECG showed new atrial fibrillation.  Patient was started on heparin drip and admitted to Cardiology.  He continued to have minimal chest pain 1/10 on 8/27 am and was scheduled to go for cardiac catheterization later today.  However, around 1112, he developed polymorphic VT that deteriorated to VF.  CPR was given for 10 mins, defibrillated once, epinephrine twice, and amiodarone 300 mg bolus given with ROSC less than 15 minutes to slow atrial fib rhythm.  Patient was moaning/shouting and making some purposeful movement afterwards before becoming agonal requiring intubation by anesthesia for airway protection.  BP stable. Patient taken emergently to the cath lab by Cardiology.  PCCM consulted for ventilator management.   PAST MEDICAL HISTORY :  He  has a past medical history of CAD (coronary artery disease); Cardiomyopathy ([place]); Hyperlipemia; and Ventral hernia.  PAST SURGICAL HISTORY: He  has a past surgical history that includes Kidney surgery.  Allergies not on file  No current facility-administered medications on file prior to encounter.    Current Outpatient Prescriptions on File Prior to Encounter  Medication Sig  . metoprolol succinate (TOPROL-XL) 50  MG 24 hr tablet Take 1 tablet (50 mg total) by mouth daily.    FAMILY HISTORY:  His has no family status information on file.    SOCIAL HISTORY: He  reports that he has never smoked. He has never used smokeless tobacco. He reports that he does not drink alcohol or use drugs.  REVIEW OF SYSTEMS:  Unable to assess.  Patient is intubated and sedated.    SUBJECTIVE:  S/p etomidate and rocuronium for intubation.  On amiodarone drip.   VITAL SIGNS: BP (!) 149/106   Pulse 91   Temp 98 F (36.7 C) (Oral)   Resp 18   Ht 5\' 8"  (1.727 m)   Wt 172 lb 9.9 oz (78.3 kg)   SpO2 97%   BMI 26.25 kg/m   HEMODYNAMICS:   VENTILATOR SETTINGS:   INTAKE / OUTPUT: I/O last 3 completed shifts: In: 28 [I.V.:55] Out: 500 [Urine:500]  PHYSICAL EXAMINATION: General:  Elderly male lying in bed s/p rocuronium  HEENT: MM pink/moist, ETT 7.5, 23 at the teeth, Pupils 3/reactive Neuro: sedated and paralyzed CV: IRIR in 60's, no murmur PULM: even/non-labored with BVM, scattered rhonchi GI: soft, bs active  Extremities: cool/dry, no edema  Skin: no rashes or lesions  LABS:  BMET  Recent Labs Lab 09/20/16 0245  NA 139  K 3.9  CL 107  CO2 24  BUN 14  CREATININE 1.09  GLUCOSE 90    Electrolytes  Recent Labs Lab 09/20/16 0245  CALCIUM 8.7*  MG 1.8    CBC  Recent Labs Lab  09/20/16 0245  WBC 8.1  HGB 14.4  HCT 42.9  PLT 193    Coag's  Recent Labs Lab 09/20/16 0245  INR 1.20    Sepsis Markers No results for input(s): LATICACIDVEN, PROCALCITON, O2SATVEN in the last 168 hours.  ABG No results for input(s): PHART, PCO2ART, PO2ART in the last 168 hours.  Liver Enzymes  Recent Labs Lab 09/20/16 0245  AST 72*  ALT 27  ALKPHOS 77  BILITOT 1.1  ALBUMIN 3.6    Cardiac Enzymes  Recent Labs Lab 09/20/16 0245  TROPONINI 4.20*    Glucose No results for input(s): GLUCAP in the last 168 hours.  Imaging X-ray Chest Pa And Lateral  Result Date:  09/20/2016 CLINICAL DATA:  Chest discomfort. EXAM: CHEST  2 VIEW COMPARISON:  09/19/2016 FINDINGS: Cardiomegaly. There are bibasilar airspace opacities which are stable since prior study and dating back to 2017, likely scarring. No definite acute airspace opacity. No effusion. No acute bony abnormality. IMPRESSION: Stable bibasilar densities, likely bibasilar scarring. Mild cardiomegaly. Electronically Signed   By: Rolm Baptise M.D.   On: 09/20/2016 10:02   STUDIES:  TTE >> LHC >>  CULTURES: 8/27 MRSA PCR >> neg  ANTIBIOTICS: none  SIGNIFICANT EVENTS: 8/27 admit/ cardiac arrest  LINES/TUBES: 20g and 22g 8/27 ETT >>  DISCUSSION: 55 yoM with known CAD admitted with chest pain, NSTEMI, and new onset afib that suffered inpatient Vfib arrest for 6 mins.  Some purposeful movement post ROSC.  Intubated and taken for LHC.  ASSESSMENT / PLAN:  PULMONARY A: Acute respiratory failure in the setting of Cardiac Arrest  P:   PRVC 8cc/kg Wean FiO2/ Peep for sats > 94% CXR now and in am ABG now Daily SBT if meets criteria VAP measures   CARDIOVASCULAR A:  VT -> Vfib arrest  W/~10 mins CPR, ROSC < 15 mins from initial rhythm change NSTEMI New onset Afib Hx CAD with prior MI and PCI in 2011 P:  Tele monitoring Emergent LHC now  Management per Cards TTE pending Goal MAP > 65 Trend troponin and EKG q 6 x 3 Continue heparin and amio gtt per Cards Checking stat electrolytes   RENAL A:   No acute issues  P:   Stat BMET/ mag/ phos Strict I/O's Replace electrolytes as indicated Avoid nephrotoxic agents, ensure adequate renal perfusion  GASTROINTESTINAL A:   NPO No acute issues  P:   Stat LFTs Place OGT  Protonix for SUP Bowel regimen with PAD protocol  HEMATOLOGIC A:   No acute issues  P:  Trend CBC Heparin gtt as above  INFECTIOUS A:   No acute issues P:   Monitor Trend fever curve/ WBC  ENDOCRINE A:   No acute issues -TSH 1.879, HA1c 6 P:   Monitor  glucose on BMET  NEUROLOGIC A:   Acute encephalopathy related to Cardiac Arrest/ sedation  - reported some purposeful movement- reaching towards BVM after ROSC P:   RASS goal: -1 PAD protocol with prn fentanyl and versed TTM not indicated given some purposeful movement after ROSC Daily WUA when stable   FAMILY  - Updates: No family at bedside.   - Inter-disciplinary family meet or Palliative Care meeting due by:  09/27/2016  CCT 40 mins  Kennieth Rad, AGACNP-BC [place] [facility] Pgr: [phone] or if no answer [phone] 09/20/2016, 12:32 PM

## 2016-09-20 NOTE — Progress Notes (Signed)
Since receiving pt at 1400, pt has only urinated 50cc into condom catheter.  Bladder scan done showing >670cc of urine.  Foley huddle completed and foley inserted using sterile technique.  Peri care was complete pre and post foley insertion.  Pt and family education.  Emotional support given.  Will continue to monitor closely and update as needed.

## 2016-09-20 NOTE — H&P (Signed)
CARDIOLOGY HISTORY AND PHYSICAL     Date: 09/20/2016 Admitting Physician: Baruch Merl, MD, PhD  Chief Complaint:  Chest Pain ____________________________________________________________________ History of Present Illness:  Lonnie Boyd is a 76 y.o. old male with medical history noted below presents with complaints of chest pain.  He states the pain started this morning while he was getting ready for church.  It was described as a dull pressure type sensation without radiation and rated a 6/10.  He took 325 mg x2 of ASA and went to church.  He states during church the pain subsided, however, during an after church dinner the pain returned though not as strong, but was associated with diaphoresis, dyspnea, and nausea.  At this point he decided to go to the local ED.  There he was found to have grossly norma labs with the exception of a cardiac troponin which was elevated at 2.58.  The ECG is reported to be normal with the exception of newly diagnosed atrial fibrillation.  Given the elevation in cardiac enzymes and his previous CAD history he was transferred to [facility] for further management.  Previous stents placed in 2011 in the LAD and Circumflex.  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  Of note, Creatinine at the OSH was 1.10.  Review of Systems:   Review of Systems:  GEN: no fever, chills, nausea, vomiting, weight change, +diaphoresis HEENT: no vision or hearing changes  PULM: no coughing, +SOB  CV: +chest pain, palpitations, PND, orthopnea  GI: no abdominal pain  GU: no dysuria  EXT: no swelling  SKIN: no rashes  NEURO: no numbness or tingling  HEME: no bleeding or bruising  GYN: none  --12 point review systems- otherwise negative.  All other systems reviewed and are negative  Past Medical History:  Diagnosis Date  . CAD (coronary artery disease)   . Ventral hernia     Past Surgical History:  Procedure Laterality  Date  . KIDNEY SURGERY      Social History   Social History  . Marital status: Married    Spouse name: N/A  . Number of children: N/A  . Years of education: N/A   Occupational History  . Not on file.   Social History Main Topics  . Smoking status: Never Smoker  . Smokeless tobacco: Never Used  . Alcohol use No  . Drug use: No  . Sexual activity: Not on file   Other Topics Concern  . Not on file   Social History Narrative  . No narrative on file    No family history on file.  Past Cardiovascular History:  +CAD - No documented h/o MI +CHF - No documented h/o PVD - No documented h/o AAA - No documented h/o valvular heart disease - No documented h/o CVA - No documented h/o Arrhythmias +A-fib  - No documented h/o congenital heart disease - No documented h/o CABG +PCI - No documented h/o cardiac devices (Pacer/ICD/CRT) - No documented h/o cardiac surgery       Most recent stress test:  None  Most recent echocardiography:  None  Most recent left heart catheterization:  None  CABG:  Date/ Physician: None  Device history:  None  Prior to Admission medications   Medication Sig Start Date End Date Taking? Authorizing Provider  metoprolol succinate (TOPROL-XL) 50 MG 24 hr tablet Take 1 tablet (50 mg total) by mouth daily. 08/16/16   Park Liter, MD    Allergies not on  file  Social History:   Social History  Substance Use Topics  . Smoking status: Never Smoker  . Smokeless tobacco: Never Used  . Alcohol use No    No family history on file.  Physical Examination: Blood pressure (!) 144/71, pulse 63, temperature 97.6 F (36.4 C), resp. rate 19, height 5\' 8"  (1.727 m), weight 78.3 kg (172 lb 9.6 oz), SpO2 99 %. General:  AAOX 4.  NAD.  NRD.  HENT: Normocephalic. Atraumatic.  No acute abnom. EYES: PERRL EOMI  Neck: Supple.  No JVD.  No bruits. Cardiovascular:  Irregular, Nl S1. Nl S2. No S3. No S4. Nl PMI. No m/r/c. Pulmonary/Chest: CTA B. No  rales. No wheezing.  Abdomen: Soft, NT, no masses, no organomegaly. Neuro: CN intact, no motor/sensory deficit.  Ext: Warm. Trace pitting edema in the lower extremities SKIN- intact   Intake/Output Summary (Last 24 hours) at 09/20/16 0243 Last data filed at 09/20/16 0150  Gross per 24 hour  Intake                0 ml  Output                0 ml  Net                0 ml    Troponin (Point of Care Test) No results for input(s): TROPIPOC in the last 72 hours. ____________________________________________________________________ Assessment/Plan NSTEMI   Assessment:  Patient with NSTEMI here for further management.  Currently chest pain free and hemodynamically stable.  Will plan for cath in the morning.   Plan  -  NPO  -  Continue metoprolol and Lipitor  -  Heparin drip for ACS and Afib management  -  ECHO  Cardiomyopathy   Assessment:  Previous notes with cardiomyopathy as a diagnosis however, clinically he is well compensated.  No rapid weight gain and sleeps on 1 pillow at night.  ECHO to access systolic function.  Continue beta blocker and losartan.   Plan  -  Continue metoprolol and losartan  -  ECHO  Afib   Assessment:  This is a new diagnosis for him.  No history of palpitations, rapid heart rate, or syncope.  Will need anticoagulation at discharge.  CHADS2-Vasc of 5.   Plan  -  Continue beta blocker as noted above  -  Heparin drip  -  Anticoagulation at discharge for stroke prevention  Thank you for consulting cardiology.    Electronically signed by Lowella Dandy 09/20/2016 Baruch Merl, MD, PhD Cardiology

## 2016-09-20 NOTE — Interval H&P Note (Signed)
Cath Lab Visit (complete for each Cath Lab visit)  Clinical Evaluation Leading to the Procedure:   ACS: Yes.    Non-ACS:    Anginal Classification: CCS III  Anti-ischemic medical therapy: No Therapy  Non-Invasive Test Results: No non-invasive testing performed  Prior CABG: No previous CABG      History and Physical Interval Note:  09/20/2016 12:07 PM  Lonnie Boyd  has presented today for surgery, with the diagnosis of cp  The various methods of treatment have been discussed with the patient and family. After consideration of risks, benefits and other options for treatment, the patient has consented to  Procedure(s): LEFT HEART CATH AND CORONARY ANGIOGRAPHY (N/A) as a surgical intervention .  The patient's history has been reviewed, patient examined, no change in status, stable for surgery.  I have reviewed the patient's chart and labs.  Questions were answered to the patient's satisfaction.     Belva Crome III

## 2016-09-20 NOTE — Progress Notes (Signed)
Patient went into V-fibb sustaining. Chest compressions was started. Bag valve mask started. Pt was shocked once. 2 doses of IV epinephrine was given and 1 bolus of IV amiodarone. All prepared by pharmacy. Then started IV amiodarone gtt at 60 cc/hr and normal saline running.  Pt's baseline heart rhythm is Atrial fibrillation. Second IV was started. Pt was a bit combative and not responding much. PT was emergently intubated. Pt taken to cath lab. Family was called- spoke to son on phone.

## 2016-09-20 NOTE — Progress Notes (Signed)
  Echocardiogram 2D Echocardiogram has been performed.  Lonnie Boyd 09/20/2016, 5:39 PM

## 2016-09-20 NOTE — Progress Notes (Signed)
  Patient Name: Lonnie Boyd   MRN: [id number]   Date of Birth/ Sex: 10-27-1940 , male      Admission Date: 09/20/2016  Attending Provider: Lowella Dandy, MD  Primary Diagnosis: <principal problem not specified>   Indication: Pt was in his usual state of health until this AM, when he was noted to be ventricular fibrillation. Code blue was subsequently called. At the time of arrival on scene, ACLS protocol was underway.   Technical Description:  - CPR performance duration:  6 minutes  - Was defibrillation or cardioversion used? Yes   - Was external pacer placed? No  - Was patient intubated pre/post CPR? Yes   Medications Administered: Y = Yes; Blank = No Amiodarone  Y  Atropine    Calcium    Epinephrine  Y  Lidocaine    Magnesium    Norepinephrine    Phenylephrine    Sodium bicarbonate    Vasopressin     Post CPR evaluation:  - Final Status - Was patient successfully resuscitated ? Yes - What is current rhythm? Atrial fibrillation - What is current hemodynamic status? Stable  Miscellaneous Information:  - Labs sent, including: None  - Primary team notified?  Yes  - Family Notified? Yes  - Additional notes/ transfer status: Transferred to ICU     Tawny Asal, MD  09/20/2016, 11:43 AM

## 2016-09-20 NOTE — Anesthesia Procedure Notes (Signed)
Procedure Name: Intubation Performed by: Oleta Mouse Pre-anesthesia Checklist: Patient identified, Emergency Drugs available, Suction available, Patient being monitored and Timeout performed Patient Re-evaluated:Patient Re-evaluated prior to induction Oxygen Delivery Method: Ambu bag Preoxygenation: Pre-oxygenation with 100% oxygen Induction Type: IV induction Laryngoscope Size: Mac and 3 Grade View: Grade I Tube type: Subglottic suction tube Tube size: 7.5 mm Number of attempts: 1 Airway Equipment and Method: Stylet Placement Confirmation: ETT inserted through vocal cords under direct vision,  CO2 detector and breath sounds checked- equal and bilateral Secured at: 22 cm Tube secured with: Tape Dental Injury: Teeth and Oropharynx as per pre-operative assessment

## 2016-09-20 NOTE — Progress Notes (Signed)
ANTICOAGULATION CONSULT NOTE - Initial Consult  Pharmacy Consult for heparin Indication: chest pain/ACS  Allergies not on file  Patient Measurements: Height: 5\' 8"  (172.7 cm) Weight: 172 lb 9.6 oz (78.3 kg) IBW/kg (Calculated) : 68.4  Vital Signs: Temp: 97.6 F (36.4 C) (08/27 0149) BP: 144/71 (08/27 0149) Pulse Rate: 63 (08/27 0149)   Medical History: Past Medical History:  Diagnosis Date  . CAD (coronary artery disease)   . Ventral hernia     Assessment: 76yo male to continue heparin gtt started at outside hospital for possible ACS.  Goal of Therapy:  Heparin level 0.3-0.7 units/ml Monitor platelets by anticoagulation protocol: Yes   Plan:  OSH gave heparin 4000 units IV bolus x1 followed by gtt at 1100 units/hr; will continue for now and monitor heparin levels and CBC.  Wynona Neat, PharmD, BCPS  09/20/2016,2:41 AM

## 2016-09-21 ENCOUNTER — Other Ambulatory Visit (HOSPITAL_COMMUNITY): Payer: Medicare Other

## 2016-09-21 ENCOUNTER — Inpatient Hospital Stay (HOSPITAL_COMMUNITY): Payer: Medicare Other

## 2016-09-21 ENCOUNTER — Encounter (HOSPITAL_COMMUNITY): Payer: Self-pay | Admitting: Cardiology

## 2016-09-21 DIAGNOSIS — J9602 Acute respiratory failure with hypercapnia: Secondary | ICD-10-CM

## 2016-09-21 DIAGNOSIS — I472 Ventricular tachycardia: Secondary | ICD-10-CM

## 2016-09-21 DIAGNOSIS — J9601 Acute respiratory failure with hypoxia: Secondary | ICD-10-CM

## 2016-09-21 DIAGNOSIS — I441 Atrioventricular block, second degree: Secondary | ICD-10-CM

## 2016-09-21 DIAGNOSIS — I480 Paroxysmal atrial fibrillation: Secondary | ICD-10-CM

## 2016-09-21 DIAGNOSIS — N179 Acute kidney failure, unspecified: Secondary | ICD-10-CM

## 2016-09-21 DIAGNOSIS — R9431 Abnormal electrocardiogram [ECG] [EKG]: Secondary | ICD-10-CM

## 2016-09-21 LAB — CBC
HCT: 42.7 % (ref 39.0–52.0)
HEMOGLOBIN: 13.9 g/dL (ref 13.0–17.0)
MCH: 31.2 pg (ref 26.0–34.0)
MCHC: 32.6 g/dL (ref 30.0–36.0)
MCV: 95.7 fL (ref 78.0–100.0)
PLATELETS: 203 10*3/uL (ref 150–400)
RBC: 4.46 MIL/uL (ref 4.22–5.81)
RDW: 14.8 % (ref 11.5–15.5)
WBC: 13.1 10*3/uL — ABNORMAL HIGH (ref 4.0–10.5)

## 2016-09-21 LAB — URINALYSIS, ROUTINE W REFLEX MICROSCOPIC
Bilirubin Urine: NEGATIVE
GLUCOSE, UA: NEGATIVE mg/dL
KETONES UR: 5 mg/dL — AB
Nitrite: NEGATIVE
Protein, ur: 30 mg/dL — AB
Specific Gravity, Urine: 1.033 — ABNORMAL HIGH (ref 1.005–1.030)
pH: 5 (ref 5.0–8.0)

## 2016-09-21 LAB — COOXEMETRY PANEL
CARBOXYHEMOGLOBIN: 1.2 % (ref 0.5–1.5)
Methemoglobin: 0.7 % (ref 0.0–1.5)
O2 SAT: 74.3 %
Total hemoglobin: 14.2 g/dL (ref 12.0–16.0)

## 2016-09-21 LAB — LIPID PANEL
CHOL/HDL RATIO: 2.4 ratio
CHOLESTEROL: 81 mg/dL (ref 0–200)
HDL: 34 mg/dL — ABNORMAL LOW (ref >40)
LDL Cholesterol: 39 mg/dL (ref 0–99)
TRIGLYCERIDES: 38 mg/dL (ref <150)
VLDL: 8 mg/dL (ref 0–40)

## 2016-09-21 LAB — BASIC METABOLIC PANEL
ANION GAP: 9 (ref 5–15)
BUN: 20 mg/dL (ref 6–20)
CO2: 19 mmol/L — AB (ref 22–32)
Calcium: 7.5 mg/dL — ABNORMAL LOW (ref 8.9–10.3)
Chloride: 111 mmol/L (ref 101–111)
Creatinine, Ser: 1.42 mg/dL — ABNORMAL HIGH (ref 0.61–1.24)
GFR calc Af Amer: 53 mL/min — ABNORMAL LOW (ref >60)
GFR, EST NON AFRICAN AMERICAN: 46 mL/min — AB (ref >60)
GLUCOSE: 111 mg/dL — AB (ref 65–99)
Potassium: 4.2 mmol/L (ref 3.5–5.1)
Sodium: 139 mmol/L (ref 135–145)

## 2016-09-21 LAB — HEPARIN LEVEL (UNFRACTIONATED)
HEPARIN UNFRACTIONATED: 0.31 [IU]/mL (ref 0.30–0.70)
HEPARIN UNFRACTIONATED: 0.54 [IU]/mL (ref 0.30–0.70)

## 2016-09-21 LAB — GLUCOSE, CAPILLARY
GLUCOSE-CAPILLARY: 136 mg/dL — AB (ref 65–99)
GLUCOSE-CAPILLARY: 150 mg/dL — AB (ref 65–99)
Glucose-Capillary: 109 mg/dL — ABNORMAL HIGH (ref 65–99)

## 2016-09-21 LAB — HEMOGLOBIN A1C
Hgb A1c MFr Bld: 6.1 % — ABNORMAL HIGH (ref 4.8–5.6)
Mean Plasma Glucose: 128.37 mg/dL

## 2016-09-21 LAB — MAGNESIUM: MAGNESIUM: 2.2 mg/dL (ref 1.7–2.4)

## 2016-09-21 LAB — PHOSPHORUS: Phosphorus: 2.5 mg/dL (ref 2.5–4.6)

## 2016-09-21 LAB — PROCALCITONIN: PROCALCITONIN: 0.64 ng/mL

## 2016-09-21 MED ORDER — VITAL AF 1.2 CAL PO LIQD
1000.0000 mL | ORAL | Status: DC
  Administered 2016-09-21 – 2016-09-22 (×2): 1000 mL

## 2016-09-21 MED ORDER — VITAL HIGH PROTEIN PO LIQD
1000.0000 mL | ORAL | Status: DC
  Administered 2016-09-21: 1000 mL
  Administered 2016-09-21: 17:00:00

## 2016-09-21 MED ORDER — ASPIRIN 81 MG PO CHEW
81.0000 mg | CHEWABLE_TABLET | Freq: Every day | ORAL | Status: DC
  Administered 2016-09-21 – 2016-09-24 (×4): 81 mg
  Filled 2016-09-21 (×5): qty 1

## 2016-09-21 MED ORDER — MIDAZOLAM HCL 2 MG/2ML IJ SOLN
1.0000 mg | INTRAMUSCULAR | Status: DC | PRN
Start: 2016-09-21 — End: 2016-09-22

## 2016-09-21 MED ORDER — VITAL HIGH PROTEIN PO LIQD: 1000.0000 mL | ORAL | Status: DC

## 2016-09-21 MED ORDER — MAGNESIUM SULFATE 2 GM/50ML IV SOLN
2.0000 g | Freq: Once | INTRAVENOUS | Status: AC
  Administered 2016-09-21: 2 g via INTRAVENOUS
  Filled 2016-09-21: qty 50

## 2016-09-21 MED ORDER — SODIUM CHLORIDE 0.9 % IV SOLN
3.0000 g | Freq: Four times a day (QID) | INTRAVENOUS | Status: DC
  Administered 2016-09-21 – 2016-09-23 (×9): 3 g via INTRAVENOUS
  Filled 2016-09-21 (×10): qty 3

## 2016-09-21 NOTE — Progress Notes (Signed)
Initial Nutrition Assessment  DOCUMENTATION CODES:   Not applicable  INTERVENTION:    D/C Vital High Protein   Vital AF 1.2 at goal rate of 70 ml/h (1680 ml per day)   TF regimen to provide 2016 kcals, 126 gm protein, 1362 ml of free water daily  NUTRITION DIAGNOSIS:   Inadequate oral intake related to inability to eat as evidenced by NPO status  GOAL:   Patient will meet greater than or equal to 90% of their needs  MONITOR:   Vent status, TF tolerance, Labs, Weight trends, Skin, I & O's  REASON FOR ASSESSMENT:   Consult Enteral/tube feeding initiation and management  ASSESSMENT:   76 yo Male with PMH of CAD, ventral hernia; presented with complaints of chest pain.  Patient is currently intubated on ventilator support MV: 8.4 L/min Temp (24hrs), Avg:99.5 F (37.5 C), Min:97.7 F (36.5 C), Max:101.8 F (38.8 C)  Pt admitted with non-STEMI in new-onset atrial fibrillation. Developed VT/VF requiring ACLS for 10 minutes before ROSC, emergently intubated. OGT placed per RN. Tip in fundus. Vital High Protein formula infusing at 40 ml/hr. Labs and medications reviewed. Cr 1.42 (H). CBG's [phone].  Diet Order:  Diet NPO time specified  Skin:  Reviewed, no issues  Last BM:  8/26  Height:   Ht Readings from Last 1 Encounters:  09/20/16 5\' 8"  (1.727 m)   Weight:   Wt Readings from Last 1 Encounters:  09/20/16 172 lb 9.9 oz (78.3 kg)   Ideal Body Weight:  70 kg  BMI:  Body mass index is 26.25 kg/m.  Estimated Nutritional Needs:   Kcal:  1944  Protein:  120-135 gm  Fluid:  per MD  EDUCATION NEEDS:   No education needs identified at this time  Arthur Holms, RD, LDN Pager #: [phone] After-Hours Pager #: [phone]

## 2016-09-21 NOTE — Progress Notes (Signed)
Progress Note  Patient Name: Lonnie Boyd Date of Encounter: 09/21/2016  Primary Cardiologist: Agustin Cree  Subjective   Intubated, sedated. Requiring a little norepinephrine for BP support. No further VT or AFib, but has frequent 2nd deg MT1 AV block. Fever to 101.51F. No major CXR infiltrates. Oxygenating well. Had bladder retention >600 mL, Foley placed. Overall UO almost 1100 mL. Mild bump in creatinine and transaminases.  Inpatient Medications    Scheduled Meds: . aspirin EC  81 mg Oral Daily  . atorvastatin  80 mg Oral q1800  . chlorhexidine gluconate (MEDLINE KIT)  15 mL Mouth Rinse BID  . Chlorhexidine Gluconate Cloth  6 each Topical Daily  . clopidogrel  75 mg Per Tube Q breakfast  . mouth rinse  15 mL Mouth Rinse 10 times per day  . pantoprazole (PROTONIX) IV  40 mg Intravenous Daily  . sodium chloride flush  10-40 mL Intracatheter Q12H  . sodium chloride flush  3 mL Intravenous Q12H   Continuous Infusions: . sodium chloride    . amiodarone 30 mg/hr (09/21/16 0700)  . fentaNYL infusion INTRAVENOUS 25 mcg/hr (09/21/16 0803)  . heparin 1,000 Units/hr (09/21/16 0700)  . midazolam (VERSED) infusion 1 mg/hr (09/21/16 0803)  . norepinephrine (LEVOPHED) Adult infusion 8 mcg/min (09/21/16 0700)   PRN Meds: sodium chloride, acetaminophen, bisacodyl, docusate, fentaNYL, nitroGLYCERIN, ondansetron (ZOFRAN) IV, ondansetron (ZOFRAN) IV, oxyCODONE, sodium chloride flush, sodium chloride flush   Vital Signs    Vitals:   09/21/16 0645 09/21/16 0700 09/21/16 0736 09/21/16 0816  BP: 94/60 97/62 94/62   Pulse: (!) 56 (!) 56    Resp: 16 16    Temp:    100.2 F (37.9 C)  TempSrc:    Oral  SpO2: 97% 98%    Weight:      Height:        Intake/Output Summary (Last 24 hours) at 09/21/16 0818 Last data filed at 09/21/16 0700  Gross per 24 hour  Intake          1690.02 ml  Output             1085 ml  Net           605.02 ml   Filed Weights   09/20/16 0149 09/20/16  1014  Weight: 172 lb 9.6 oz (78.3 kg) 172 lb 9.9 oz (78.3 kg)    Telemetry    NSR, 2nd deg AV block MT1 - Personally Reviewed  ECG    NSR, deep broad anterior T wave inversion, QT 525 ms - Personally Reviewed  Physical Exam  Sedated, intubated, calm GEN: No acute distress.   Neck: No JVD Cardiac: RRR, no murmurs, rubs, or gallops.  Respiratory: Clear to auscultation bilaterally. GI: Soft, nontender, non-distended  MS: No edema; No deformity. Neuro:  Nonfocal, limited evaluation  Psych: cannot assess  Labs    Chemistry Recent Labs Lab 09/20/16 0245 09/20/16 1727 09/21/16 0439  NA 139 139 139  K 3.9 4.5 4.2  CL 107 110 111  CO2 24 21* 19*  GLUCOSE 90 146* 111*  BUN _0 CREATININE 1.09 1.31* 1.42*  CALCIUM 8.7* 7.6* 7.5*  PROT 6.1* 5.9*  --   ALBUMIN 3.6 3.2*  --   AST 72* 187*  --   ALT 27 89*  --   ALKPHOS 77 74  --   BILITOT 1.1 1.2  --   GFRNONAA >60 50* 46*  GFRAA >60 58* 53*  ANIONGAP 8 8 9  Hematology Recent Labs Lab 09/20/16 0245 09/20/16 1727 09/21/16 0439  WBC 8.1 13.9* 13.1*  RBC 4.53 4.56 4.46  HGB 14.4 14.6 13.9  HCT 42.9 43.9 42.7  MCV 94.7 96.3 95.7  MCH 31.8 32.0 31.2  MCHC 33.6 33.3 32.6  RDW 14.4 14.5 14.8  PLT 193 219 203    Cardiac Enzymes Recent Labs Lab 09/20/16 0245 09/20/16 1140 09/20/16 1727  TROPONINI 4.20* 6.80* 12.60*   No results for input(s): TROPIPOC in the last 168 hours.   BNP Recent Labs Lab 09/20/16 0245  BNP 556.1*     DDimer No results for input(s): DDIMER in the last 168 hours.   Radiology    X-ray Chest Pa And Lateral  Result Date: 09/20/2016 CLINICAL DATA:  Chest discomfort. EXAM: CHEST  2 VIEW COMPARISON:  09/19/2016 FINDINGS: Cardiomegaly. There are bibasilar airspace opacities which are stable since prior study and dating back to 2017, likely scarring. No definite acute airspace opacity. No effusion. No acute bony abnormality. IMPRESSION: Stable bibasilar densities, likely  bibasilar scarring. Mild cardiomegaly. Electronically Signed   By: Kevin  Dover M.D.   On: 09/20/2016 10:02   Portable Chest Xray  Result Date: 09/21/2016 CLINICAL DATA:  Intubated patient, respiratory failure, cardiomyopathy, NSTEMI, cardiac arrest. EXAM: PORTABLE CHEST 1 VIEW COMPARISON:  Portable chest x-ray of September 20, 2016 FINDINGS: The right lung is reasonably well inflated. There is increased density at the lung bases not significantly changed from yesterday's study. There is a small left pleural effusion and trace right pleural effusion. The heart and pulmonary vascularity are normal. External pacemaker defibrillator pads are present. The endotracheal tube tip lies approximately 3.3 cm above the carina. The right subclavian venous catheter tip projects over the midportion of the SVC. The bony thorax exhibits no acute abnormality. IMPRESSION: Persistent bibasilar densities which may reflect atelectasis or pneumonia. Small left and trace right pleural effusions. The support tubes are in reasonable position. There is no overt CHF. Electronically Signed   By: David  Jordan M.D.   On: 09/21/2016 07:46   Dg Chest Port 1 View  Result Date: 09/20/2016 CLINICAL DATA:  Central line placement EXAM: PORTABLE CHEST 1 VIEW COMPARISON:  Portable exam 1652 hours compared to 09/20/2016 FINDINGS: Tip of endotracheal tube projects 3.7 cm above carina. RIGHT subclavian central venous catheter newly identified with tip projecting over SVC near cavoatrial junction. External pacing leads and EKG leads project over chest. Enlargement of cardiac silhouette. Mediastinal contours and pulmonary vascularity normal. Large bleb at the medial RIGHT lung base again identified. Bibasilar atelectasis versus infiltrate increased on LEFT. No gross pleural effusion or pneumothorax. IMPRESSION: No pneumothorax following RIGHT subclavian line placement. BILATERAL lower lobe opacities question atelectasis versus infiltrate increased on  LEFT since previous exam. Electronically Signed   By: Mark  Boles M.D.   On: 09/20/2016 17:15   Dg Chest Port 1 View  Result Date: 09/20/2016 CLINICAL DATA:  Orogastric tube placement and endotracheal tube placement. EXAM: PORTABLE CHEST 1 VIEW COMPARISON:  Radiograph of same day. FINDINGS: Stable cardiomediastinal silhouette. Endotracheal tube is seen projected over tracheal air shadow with distal tip 3 cm above the carina. Interval placement of orogastric tube with tip coiled in the large hiatal hernia above the right hemidiaphragm. No pneumothorax or significant pleural effusion is noted. Stable bibasilar atelectasis or edema is noted. Bony thorax is unremarkable. IMPRESSION: Endotracheal tube in grossly good position. Distal tip of orogastric tube is seen in large hiatal hernia above right hemidiaphragm. Stable bibasilar atelectasis or edema.   Electronically Signed   By: James  Green Jr, M.D.   On: 09/20/2016 14:01    Cardiac Studies   Echo 09/20/2016 - Left ventricle: The cavity size was normal. There was mild   concentric hypertrophy. Systolic function was severely reduced.   The estimated ejection fraction was in the range of 20% to 25%.   Features are consistent with a pseudonormal left ventricular   filling pattern, with concomitant abnormal relaxation and   increased filling pressure (grade 2 diastolic dysfunction).   Doppler parameters are consistent with elevated ventricular   end-diastolic filling pressure. - Mitral valve: There was mild regurgitation. - Right ventricle: The cavity size was moderately dilated. Wall   thickness was normal. Systolic function was severely reduced. - Right atrium: The atrium was normal in size. - Tricuspid valve: There was moderate regurgitation. - Pulmonary arteries: Systolic pressure was moderately increased.   PA peak pressure: 42 mm Hg (S). - Inferior vena cava: The vessel was dilated. The respirophasic   diameter changes were blunted (< 50%),  consistent with elevated   central venous pressure. - Pericardium, extracardiac: There was no pericardial effusion.  Impressions:  - Severe LV dysfunction with LVEF 15-20% with akinesis of all of   the apical and mid segments. Moderate RV systolic dysfunction.  CATH 09/20/2016   20-30% mid body stenosis of the left main. This seemed to decrease in severity as the procedure progressed suggesting catheter-induced spasm.  Dominant right coronary with 40% mid vessel stenosis and tandem 60% PDA stenoses.  Large left anterior descending which wraps around the left ventricular apex. The vessel contains a proximal to mid stent. There is diffuse 40-50% in-stent restenosis. No critical obstruction is noted. A diagonal branch is seen to fill very late and the stump is not definable.  The circumflex gives origin to multiple obtuse marginal branches. A proximal to mid stent contains diffuse 40% restenosis. At the proximal stent margin there is eccentric 65-75% restenosis. The lesion does not account for the wall motion abnormality.   Severe left ventricular systolic dysfunction, with normal LVEDP of 13 mmHg. 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  Acute systolic heart failure versus chronic systolic heart failure will depend upon pre-existing knowledge about LV function.  RECOMMENDATIONS:   No acute intervention is recommended at this time.  Obtained prior cath and LV function data.  Continue amiodarone.  Continue IV heparin.  Start Plavix.   Echocardiogram.   Further testing and possibly percutaneous intervention on the proximal circumflex depending upon course. Resting               Left Heart   Left Ventricle The left ventricle is dilated. There is severe left ventricular systolic dysfunction. LV end diastolic pressure is normal. The left ventricular ejection fraction is 25-35% by visual estimate. There are LV function  abnormalities due to segmental dysfunction. There is no evidence of mitral regurgitation.    Coronary Diagrams   Diagnostic Diagram          Patient Profile     76 y.o. male with known CAD and previous PCI to LAD and LACx, borderline LVEF 50% in May 2018, presenting with newly diagnosed atrial fibrillation and apparent NSTEMI (troponin 4), shortly thereafter had polymorphic VT arrest requiring CPR/defib/intubation, subsequently found to have stable coronary anatomy and new extensive LV wall motion abnormalities suggestive of takotsubo sd, with relatively mild troponin increase (12). Now with cardiogenic shock and mild AKI, intubated.  Assessment &   Plan    1. Cardiogenic shock: unclear whether the stress cardiomyopathy was an initial insult (retirement dinner the night before?) or a result of the resuscitation, would expect gradual recovery over the next few days. Continue norepi support. Monitor CVP and mixed venous O2. Avoid beta blockers for now. Recheck echo end of week. 2. VT:  No recurrence. On amio drip, but has 2nd deg MT1 block.. 3. Long QT: QT, which was normal at admission, has prolonged substantially, whether due to ischemia/takotsubo sd or amiodarone. Will ask EP opinion about continuing amiodarone, as well as risk stratification for recurrent arrhythmia (Life Vest versus ICD). 4. CAD/NSTEMI: wall motion abnormality is disproportionate to small troponin leak, consistent with extensive stunned myocardium. No revascularization was performed. If this is considered a NSTEMI, ICD should wait 40 days (although this may have been stress cardiomyopathy). 5. AFib: present on admission and asymptomatic. CHADSVasc 5 (age 1, CHF, CAD, HTN). Converted to SR while on IV amiodarone, will need DOAC at DC. 6. 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 No severe bradycardia yet. Will stop amiodarone. 7. Fever: no obvious source of infection to date, aspiration possible, but he was thankfully NPO for  cath. 8. HLP: excellent lipids and A1c. 9. AKI: post arrest, not too bad, but watch for possible delayed contrast nephrotoxicity 10. Elevated transaminases: a little bit of "shock liver", recheck in AM.  Critically ill with multiple organ-system failure. Prognosis is guarded. Discussed with PCCM team. Total time 65 minutes.  Signed,  , MD  09/21/2016, 8:18 AM    

## 2016-09-21 NOTE — Progress Notes (Signed)
Pharmacy Antibiotic Note  Lonnie Boyd is a 76 y.o. male admitted on 09/20/2016 with CP and new onset AFib now s/p VF arrest.  Pharmacy has been consulted for Unasyn dosing for possible aspiration during CPR. Tmax 101.8 overnight, WBC elevated, CrCl ~42 ml/min.  Plan: -Unasyn 3g IV q6h -Monitor LOT, renal function, cultures  Height: 5\' 8"  (172.7 cm) Weight: 172 lb 9.9 oz (78.3 kg) IBW/kg (Calculated) : 68.4  Temp (24hrs), Avg:99.4 F (37.4 C), Min:97.7 F (36.5 C), Max:101.8 F (38.8 C)   Recent Labs Lab 09/20/16 0245 09/20/16 1727 09/21/16 0439  WBC 8.1 13.9* 13.1*  CREATININE 1.09 1.31* 1.42*    Estimated Creatinine Clearance: 41.5 mL/min (A) (by C-G formula based on SCr of 1.42 mg/dL (H)).    No Known Allergies  Antimicrobials this admission: 8/28 Unasyn >>   Dose adjustments this admission: n/a  Microbiology results: 8/28 BCx: px 8/28 UCx: px   8/27 MRSA PCR: neg  Thank you for allowing pharmacy to be a part of this patient's care.  Arrie Senate, PharmD PGY-2 Cardiology Pharmacy Resident Pager: [phone] 09/21/2016

## 2016-09-21 NOTE — Progress Notes (Addendum)
[place] for heparin Indication: chest pain/ACS  No Known Allergies  Patient Measurements: Height: 5\' 8"  (172.7 cm) Weight: 172 lb 9.9 oz (78.3 kg) IBW/kg (Calculated) : 68.4  Vital Signs: Temp: 101.8 F (38.8 C) (08/28 0300) Temp Source: Axillary (08/28 0300) BP: 97/62 (08/28 0700) Pulse Rate: 56 (08/28 0700)   Medical History: Past Medical History:  Diagnosis Date  . CAD (coronary artery disease)   . Cardiomyopathy ([place])   . Hyperlipemia   . Ventral hernia     Assessment: 65yoM with CP and new onset AFib to continue on heparin per pharmacy. Pt is s/p code blue yesterday with VF arrest and intubation for airway protection. Pt taken urgently to cath lab, no obvious occlusion or cause of arrhythmia noted. To continue on heparin for now. Heparin level therapeutic at 0.31, CBC stable.  Goal of Therapy:  Heparin level 0.3-0.7 units/ml Monitor platelets by anticoagulation protocol: Yes   Plan:  -Continue heparin 1000 units/hr -Check 8-hr confirmatory level -Monitor heparin level, CBC, S/Sx bleeding daily  ADDENDUM: Repeat heparin level remains therapeutic at 0.54.  Plan: -Continue heparin at 1000 units/hr as noted above -Heparin level in am  Arrie Senate, PharmD PGY-2 Cardiology Pharmacy Resident Pager: [phone] 09/21/2016

## 2016-09-21 NOTE — Progress Notes (Signed)
PULMONARY / CRITICAL CARE MEDICINE   Name: Lonnie Boyd MRN: [id number] DOB: 05/10/1940    ADMISSION DATE:  09/20/2016 CONSULTATION DATE:  09/20/2016  REFERRING MD:  Dr. Sallyanne Kuster  CHIEF COMPLAINT:  Cardiac arrest  HISTORY OF PRESENT ILLNESS:  HPI obtained from medical chart review as patient is intubated and sedated. 76 year old male with PMH of CAD with prior MI and DLD admitted on 8/27 with NSTEMI and new onset afib.  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   Patient had complained of dull 6/10 chest pressure that started on the morning of 8/26.  He took aspirin and went to church and pain subsided but returned at dinner with associated diaphoresis, dyspnea, and nausea.  In the ED, troponin was 2.58 -> 4.2 and ECG showed new atrial fibrillation.  Patient was started on heparin drip and admitted to Cardiology.  He continued to have minimal chest pain 1/10 on 8/27 am and was scheduled to go for cardiac catheterization later today.  However, around 1112, he developed polymorphic VT that deteriorated to VF.  CPR was given for 10 mins, defibrillated once, epinephrine twice, and amiodarone 300 mg bolus given with ROSC less than 15 minutes to slow atrial fib rhythm.  Patient was moaning/shouting and making some purposeful movement afterwards before becoming agonal requiring intubation by anesthesia for airway protection.  BP stable. Patient taken emergently to the cath lab by Cardiology.  PCCM consulted for ventilator management.   SUBJECTIVE:  Bladder retention > 600 ml, foley placed Tmax 101.8 Per RN, agitated overnight, continued fentanyl and versed gtt Levophed 42mcg/min Amio stopped per Cards for bradycardia/ 2nd AV block   VITAL SIGNS: BP 94/62   Pulse (!) 56   Temp 100.2 F (37.9 C) (Oral)   Resp 16   Ht 5\' 8"  (1.727 m)   Wt 172 lb 9.9 oz (78.3 kg)   SpO2 98%   BMI 26.25 kg/m   HEMODYNAMICS: CVP:  [6 mmHg-11 mmHg] 10 mmHg VENTILATOR SETTINGS: Vent Mode:  PRVC FiO2 (%):  [50 %-100 %] 50 % Set Rate:  [16 bmp] 16 bmp Vt Set:  [540 mL] 540 mL PEEP:  [5 cmH20] 5 cmH20 Plateau Pressure:  [19 cmH20-22 cmH20] 19 cmH20 INTAKE / OUTPUT: I/O last 3 completed shifts: In: 7408 [P.O.:240; I.V.:1505] Out: 1585 [Urine:1585]  PHYSICAL EXAMINATION: General:  Elderly male  HEENT: MM pink/moist, ETT 7.5, 23 at the teeth, Pupils 3/reactive Neuro: sedated and paralyzed CV: IRIR in 60's, no murmur PULM: even/non-labored with BVM, scattered rhonchi GI: soft, bs active  Extremities: cool/dry, no edema  Skin: no rashes or lesions  LABS:  BMET  Recent Labs Lab 09/20/16 0245 09/20/16 1727 09/21/16 0439  NA 139 139 139  K 3.9 4.5 4.2  CL 107 110 111  CO2 24 21* 19*  BUN 14 15 20   CREATININE 1.09 1.31* 1.42*  GLUCOSE 90 146* 111*    Electrolytes  Recent Labs Lab 09/20/16 0245 09/20/16 1727 09/21/16 0439  CALCIUM 8.7* 7.6* 7.5*  MG 1.8 1.7  --     CBC  Recent Labs Lab 09/20/16 0245 09/20/16 1727 09/21/16 0439  WBC 8.1 13.9* 13.1*  HGB 14.4 14.6 13.9  HCT 42.9 43.9 42.7  PLT 193 219 203    Coag's  Recent Labs Lab 09/20/16 0245  INR 1.20    Sepsis Markers No results for input(s): LATICACIDVEN, PROCALCITON, O2SATVEN in the last 168 hours.  ABG  Recent Labs Lab  09/20/16 1628  PHART 7.339*  PCO2ART 34.3  PO2ART 298*    Liver Enzymes  Recent Labs Lab 09/20/16 0245 09/20/16 1727  AST 72* 187*  ALT 27 89*  ALKPHOS 77 74  BILITOT 1.1 1.2  ALBUMIN 3.6 3.2*    Cardiac Enzymes  Recent Labs Lab 09/20/16 0245 09/20/16 1140 09/20/16 1727  TROPONINI 4.20* 6.80* 12.60*    Glucose No results for input(s): GLUCAP in the last 168 hours.  Imaging X-ray Chest Pa And Lateral  Result Date: 09/20/2016 CLINICAL DATA:  Chest discomfort. EXAM: CHEST  2 VIEW COMPARISON:  09/19/2016 FINDINGS: Cardiomegaly. There are bibasilar airspace opacities which are stable since prior study and dating back to 2017, likely  scarring. No definite acute airspace opacity. No effusion. No acute bony abnormality. IMPRESSION: Stable bibasilar densities, likely bibasilar scarring. Mild cardiomegaly. Electronically Signed   By: Rolm Baptise M.D.   On: 09/20/2016 10:02   Portable Chest Xray  Result Date: 09/21/2016 CLINICAL DATA:  Intubated patient, respiratory failure, cardiomyopathy, NSTEMI, cardiac arrest. EXAM: PORTABLE CHEST 1 VIEW COMPARISON:  Portable chest x-ray of September 20, 2016 FINDINGS: The right lung is reasonably well inflated. There is increased density at the lung bases not significantly changed from yesterday's study. There is a small left pleural effusion and trace right pleural effusion. The heart and pulmonary vascularity are normal. External pacemaker defibrillator pads are present. The endotracheal tube tip lies approximately 3.3 cm above the carina. The right subclavian venous catheter tip projects over the midportion of the SVC. The bony thorax exhibits no acute abnormality. IMPRESSION: Persistent bibasilar densities which may reflect atelectasis or pneumonia. Small left and trace right pleural effusions. The support tubes are in reasonable position. There is no overt CHF. Electronically Signed   By: David  Martinique M.D.   On: 09/21/2016 07:46   Dg Chest Port 1 View  Result Date: 09/20/2016 CLINICAL DATA:  Central line placement EXAM: PORTABLE CHEST 1 VIEW COMPARISON:  Portable exam 1652 hours compared to 09/20/2016 FINDINGS: Tip of endotracheal tube projects 3.7 cm above carina. RIGHT subclavian central venous catheter newly identified with tip projecting over SVC near cavoatrial junction. External pacing leads and EKG leads project over chest. Enlargement of cardiac silhouette. Mediastinal contours and pulmonary vascularity normal. Large bleb at the medial RIGHT lung base again identified. Bibasilar atelectasis versus infiltrate increased on LEFT. No gross pleural effusion or pneumothorax. IMPRESSION: No  pneumothorax following RIGHT subclavian line placement. BILATERAL lower lobe opacities question atelectasis versus infiltrate increased on LEFT since previous exam. Electronically Signed   By: Lavonia Dana M.D.   On: 09/20/2016 17:15   Dg Chest Port 1 View  Result Date: 09/20/2016 CLINICAL DATA:  Orogastric tube placement and endotracheal tube placement. EXAM: PORTABLE CHEST 1 VIEW COMPARISON:  Radiograph of same day. FINDINGS: Stable cardiomediastinal silhouette. Endotracheal tube is seen projected over tracheal air shadow with distal tip 3 cm above the carina. Interval placement of orogastric tube with tip coiled in the large hiatal hernia above the right hemidiaphragm. No pneumothorax or significant pleural effusion is noted. Stable bibasilar atelectasis or edema is noted. Bony thorax is unremarkable. IMPRESSION: Endotracheal tube in grossly good position. Distal tip of orogastric tube is seen in large hiatal hernia above right hemidiaphragm. Stable bibasilar atelectasis or edema. Electronically Signed   By: Marijo Conception, M.D.   On: 09/20/2016 14:01   STUDIES:  TTE 8/27 >> Severe LV dysfunction with LVEF 15-20% with akinesis of all of the apical and mid segments.  Moderate RV systolic dysfunction; PAP 42 LHC 8/27 >>   20-30% mid body stenosis of the left main. This seemed to decrease in severity as the procedure progressed suggesting catheter-induced spasm.  Dominant right coronary with 40% mid vessel stenosis and tandem 60% PDA stenoses.  Large left anterior descending which wraps around the left ventricular apex. The vessel contains a proximal to mid stent. There is diffuse 40-50% in-stent restenosis. No critical obstruction is noted. A diagonal branch is seen to fill very late and the stump is not definable.  The circumflex gives origin to multiple obtuse marginal branches. A proximal to mid stent contains diffuse 40% restenosis. At the proximal stent margin there is eccentric 65-75%  restenosis. The lesion does not account for the wall motion abnormality.   Severe left ventricular systolic dysfunction, with normal LVEDP of 13 mmHg. 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  Acute systolic heart failure versus chronic systolic heart failure will depend upon pre-existing knowledge about LV function.  CULTURES: 8/27 MRSA PCR >> neg 8/28 BCx2 >> 8/28 UC >> 8/28 sputum >>  ANTIBIOTICS: 8/28 >> Unasyn >>  SIGNIFICANT EVENTS: 8/27 admit/ cardiac arrest  LINES/TUBES: 20g and 22g 8/27 ETT >>  DISCUSSION: 36 yoM with known CAD admitted with chest pain, NSTEMI, and new onset afib that suffered inpatient Vfib arrest for 6 mins.  Some purposeful movement post ROSC.  Intubated and taken for LHC.  ASSESSMENT / PLAN:  PULMONARY A: Acute respiratory failure in the setting of Cardiac Arrest  R/o aspiration PNA P:   PRVC 8cc/kg Wean FiO2/ Peep for sats > 94% Trend CXR Daily SBT VAP measures  See ID as below  CARDIOVASCULAR A:  VT -> Vfib arrest  W/~10 mins CPR, ROSC < 15 mins Cardiogenic shock - ? takotsubo NSTEMI/ CAD- LH New onset Afib converted to to SB/ 2nd AV MT1 block on amio Prolonged QTc Hx CAD with prior MI and PCI in 2011 - 8/28 Mag 1.7 P:  Tele monitoring Management per Cards Levophed for MAP > 65 Amiodarone gtt d/c'd Continue Heparin gtt  Trending CVP and co-ox  Mag 2gm x 1 for goal > 2   RENAL A:   AKI - r/t hypotension vs contrast nephropathy   P:   Trend BMET/ mag/ phos Strict I/O's Replace electrolytes as indicated  GASTROINTESTINAL A:   Mild transaminitis Hx hiatal hernia P:   Trend LFTs, ? R/t shock  Place OGT- goal to pass beyond large hiatal hernia, if unable will need cortrak or placement under fluro to start TF Protonix for SUP Bowel regimen with PAD protocol  HEMATOLOGIC A:   Leukocytosis P:  Trend CBC Heparin gtt as above  INFECTIOUS A:   Leukocytosis -  Tmax 101.8 - ? Early infiltrate on CXR; ? Aspiration during CPR, was NPO prior to cardiac arrest for cath  P:   Send UA and cultures Trend PCT Empiric Unasyn for now->deesculate as able  Trend fever curve/ WBC  ENDOCRINE A:   No acute issues P:   CBG q 4  NEUROLOGIC A:   Acute encephalopathy related to Cardiac Arrest/ sedation  - 8/28 WUA patient moving all extremities, reaching for ETT, but not following commands, agitation - unable to use precedex given bradycardia P:   RASS goal: 0 PAD protocol with fentanyl gtt D/C versed gtt- change to prn Daily WUA   FAMILY  - Updates: Granddaughter updated at bedside.   - Inter-disciplinary family meet or Palliative  Care meeting due by:  09/27/2016  CCT 40 mins  Kennieth Rad, AGACNP-BC [place] [facility] Pgr: [phone] or if no answer [phone] 09/21/2016, 9:12 AM

## 2016-09-22 ENCOUNTER — Inpatient Hospital Stay (HOSPITAL_COMMUNITY): Payer: Medicare Other

## 2016-09-22 DIAGNOSIS — R570 Cardiogenic shock: Secondary | ICD-10-CM

## 2016-09-22 LAB — COMPREHENSIVE METABOLIC PANEL
ALT: 53 U/L (ref 17–63)
ANION GAP: 5 (ref 5–15)
AST: 103 U/L — ABNORMAL HIGH (ref 15–41)
Albumin: 2.4 g/dL — ABNORMAL LOW (ref 3.5–5.0)
Alkaline Phosphatase: 61 U/L (ref 38–126)
BUN: 31 mg/dL — ABNORMAL HIGH (ref 6–20)
CHLORIDE: 111 mmol/L (ref 101–111)
CO2: 22 mmol/L (ref 22–32)
CREATININE: 1.18 mg/dL (ref 0.61–1.24)
Calcium: 7.5 mg/dL — ABNORMAL LOW (ref 8.9–10.3)
GFR, EST NON AFRICAN AMERICAN: 57 mL/min — AB (ref >60)
Glucose, Bld: 171 mg/dL — ABNORMAL HIGH (ref 65–99)
POTASSIUM: 3.8 mmol/L (ref 3.5–5.1)
SODIUM: 138 mmol/L (ref 135–145)
Total Bilirubin: 1.4 mg/dL — ABNORMAL HIGH (ref 0.3–1.2)
Total Protein: 4.9 g/dL — ABNORMAL LOW (ref 6.5–8.1)

## 2016-09-22 LAB — GLUCOSE, CAPILLARY
GLUCOSE-CAPILLARY: 169 mg/dL — AB (ref 65–99)
GLUCOSE-CAPILLARY: 131 mg/dL — AB (ref 65–99)
Glucose-Capillary: 156 mg/dL — ABNORMAL HIGH (ref 65–99)
Glucose-Capillary: 153 mg/dL — ABNORMAL HIGH (ref 65–99)

## 2016-09-22 LAB — CBC
HCT: 35.4 % — ABNORMAL LOW (ref 39.0–52.0)
Hemoglobin: 11.8 g/dL — ABNORMAL LOW (ref 13.0–17.0)
MCH: 31.8 pg (ref 26.0–34.0)
MCHC: 33.3 g/dL (ref 30.0–36.0)
MCV: 95.4 fL (ref 78.0–100.0)
PLATELETS: 156 10*3/uL (ref 150–400)
RBC: 3.71 MIL/uL — ABNORMAL LOW (ref 4.22–5.81)
RDW: 15.3 % (ref 11.5–15.5)
WBC: 12.7 10*3/uL — ABNORMAL HIGH (ref 4.0–10.5)

## 2016-09-22 LAB — URINE CULTURE
Culture: NO GROWTH
Special Requests: NORMAL

## 2016-09-22 LAB — MAGNESIUM: MAGNESIUM: 2.1 mg/dL (ref 1.7–2.4)

## 2016-09-22 LAB — PHOSPHORUS: PHOSPHORUS: 1.7 mg/dL — AB (ref 2.5–4.6)

## 2016-09-22 LAB — PROCALCITONIN: PROCALCITONIN: 0.54 ng/mL

## 2016-09-22 LAB — HEPARIN LEVEL (UNFRACTIONATED)
HEPARIN UNFRACTIONATED: 0.19 [IU]/mL — AB (ref 0.30–0.70)
HEPARIN UNFRACTIONATED: 0.23 [IU]/mL — AB (ref 0.30–0.70)

## 2016-09-22 MED ORDER — ONDANSETRON HCL 4 MG/2ML IJ SOLN
4.0000 mg | Freq: Three times a day (TID) | INTRAMUSCULAR | Status: DC | PRN
  Administered 2016-09-22 – 2016-09-24 (×2): 4 mg via INTRAVENOUS
  Filled 2016-09-22 (×2): qty 2

## 2016-09-22 MED ORDER — SODIUM PHOSPHATES 45 MMOLE/15ML IV SOLN
20.0000 mmol | Freq: Once | INTRAVENOUS | Status: AC
  Administered 2016-09-22: 20 mmol via INTRAVENOUS
  Filled 2016-09-22: qty 6.67

## 2016-09-22 MED ORDER — METOPROLOL TARTRATE 5 MG/5ML IV SOLN
INTRAVENOUS | Status: AC
  Administered 2016-09-22: 2.5 mg via INTRAVENOUS
  Filled 2016-09-22: qty 5

## 2016-09-22 MED ORDER — FENTANYL 2500MCG IN NS 250ML (10MCG/ML) PREMIX INFUSION
25.0000 ug/h | INTRAVENOUS | Status: DC
  Administered 2016-09-22: 50 ug/h via INTRAVENOUS
  Filled 2016-09-22: qty 250

## 2016-09-22 MED ORDER — ALBUTEROL SULFATE (2.5 MG/3ML) 0.083% IN NEBU
2.5000 mg | INHALATION_SOLUTION | RESPIRATORY_TRACT | Status: DC | PRN
  Administered 2016-09-22 (×2): 2.5 mg via RESPIRATORY_TRACT
  Filled 2016-09-22 (×2): qty 3

## 2016-09-22 MED ORDER — METOPROLOL TARTRATE 12.5 MG HALF TABLET
12.5000 mg | ORAL_TABLET | Freq: Two times a day (BID) | ORAL | Status: DC
  Administered 2016-09-23 – 2016-09-24 (×3): 12.5 mg via ORAL
  Filled 2016-09-22 (×4): qty 1

## 2016-09-22 MED ORDER — ORAL CARE MOUTH RINSE
15.0000 mL | Freq: Two times a day (BID) | OROMUCOSAL | Status: DC
  Administered 2016-09-23 – 2016-09-25 (×6): 15 mL via OROMUCOSAL

## 2016-09-22 MED ORDER — SODIUM PHOSPHATES 45 MMOLE/15ML IV SOLN: 10.0000 mmol | Freq: Once | INTRAVENOUS | Status: DC

## 2016-09-22 MED ORDER — METOPROLOL TARTRATE 5 MG/5ML IV SOLN
2.5000 mg | Freq: Four times a day (QID) | INTRAVENOUS | Status: DC | PRN
  Administered 2016-09-22: 2.5 mg via INTRAVENOUS

## 2016-09-22 MED ORDER — ZOLPIDEM TARTRATE 5 MG PO TABS
5.0000 mg | ORAL_TABLET | Freq: Every evening | ORAL | Status: DC | PRN
  Administered 2016-09-22 – 2016-09-25 (×4): 5 mg via ORAL
  Filled 2016-09-22 (×4): qty 1

## 2016-09-22 MED ORDER — PANTOPRAZOLE SODIUM 40 MG PO TBEC
40.0000 mg | DELAYED_RELEASE_TABLET | Freq: Every day | ORAL | Status: DC
  Administered 2016-09-22 – 2016-09-26 (×5): 40 mg via ORAL
  Filled 2016-09-22 (×5): qty 1

## 2016-09-22 NOTE — Progress Notes (Signed)
[place] for heparin Indication: chest pain/ACS  No Known Allergies  Patient Measurements: Height: 5\' 8"  (172.7 cm) Weight: 178 lb 4.8 oz (80.9 kg) IBW/kg (Calculated) : 68.4  Vital Signs: Temp: 97.4 F (36.3 C) (08/29 1200) Temp Source: Axillary (08/29 1200) BP: 117/85 (08/29 1300) Pulse Rate: 63 (08/29 1300)   Medical History: Past Medical History:  Diagnosis Date  . CAD (coronary artery disease)   . Cardiomyopathy ([place])   . Hyperlipemia   . Ventral hernia     Assessment: 34yoM with CP and new onset AFib to continue on heparin per pharmacy. Pt is s/p code blue yesterday with VF arrest and intubation for airway protection. Pt taken urgently to cath lab, no obvious occlusion or cause of arrhythmia noted. To continue on heparin for now.  Heparin level remains subtherapeutic at 0.23 despite rate adjustment this morning, CBC remains stable, no issues with infusion per RN.  Goal of Therapy:  Heparin level 0.3-0.7 units/ml Monitor platelets by anticoagulation protocol: Yes   Plan:  -Increase heparin to 1350 units/hr -Check 8-hr heparin level -Monitor heparin level, CBC, S/Sx bleeding daily  Arrie Senate, PharmD PGY-2 Cardiology Pharmacy Resident Pager: [phone] 09/22/2016

## 2016-09-22 NOTE — Progress Notes (Signed)
PULMONARY / CRITICAL CARE MEDICINE   Name: Lonnie Boyd MRN: [id number] DOB: [birth year]    ADMISSION DATE:  09/20/2016 CONSULTATION DATE:  09/20/2016  REFERRING MD:  Dr. Sallyanne Kuster  CHIEF COMPLAINT:  Cardiac arrest  HISTORY OF PRESENT ILLNESS:   76 year old male with PMH of CAD with prior MI admitted on 8/27 with NSTEMI and new onset afib.  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   8/27 am  developed polymorphic VT that deteriorated to VF.  CPR was given for 10 mins, defibrillated once, epinephrine twice, and amiodarone 300 mg bolus given with ROSC less than 15 minutes to slow atrial fib rhythm.  Patient was moaning/shouting and making some purposeful movement afterwards before becoming agonal requiring intubation by anesthesia for airway protection.  Patient taken emergently to the cath lab by Cardiology.   STUDIES:  TTE 8/27 >> Severe LV dysfunction with LVEF 15-20% with akinesis of all of the apical and mid segments. Moderate RV systolic dysfunction; PAP 42 LHC 8/27 >> patent stents  Severe left ventricular systolic dysfunction, with normal LVEDP of 13 mmHg. 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   CULTURES: 8/27 MRSA PCR >> neg 8/28 BCx2 >> 8/28 UC >> 8/28 sputum >>  ANTIBIOTICS: 8/28 >> Unasyn >>  SIGNIFICANT EVENTS: 8/27 admit/ cardiac arrest , levophed gtt 8/28  Bladder retention > 600 ml, foley placed Febrile >> unasyn Amio stopped per Cards for bradycardia/ 2nd AV block   LINES/TUBES: 20g and 22g 8/27 ETT >>   SUBJECTIVE:  Remains critically ill but improving Levophed now off defervesced  VITAL SIGNS: BP 118/77   Pulse 98   Temp 99.2 F (37.3 C) (Oral)   Resp 16   Ht 5\' 8"  (1.727 m)   Wt 178 lb 4.8 oz (80.9 kg)   SpO2 97%   BMI 27.11 kg/m   HEMODYNAMICS: CVP:  [6 mmHg-10 mmHg] 10 mmHg VENTILATOR SETTINGS: Vent Mode: PRVC FiO2 (%):  [40 %] 40 % Set Rate:  [16 bmp] 16  bmp Vt Set:  [540 mL] 540 mL PEEP:  [5 cmH20] 5 cmH20 Plateau Pressure:  [16 cmH20-22 cmH20] 20 cmH20 INTAKE / OUTPUT: I/O last 3 completed shifts: In: 2913.2 [I.V.:1302.9; NG/GT:1160.3; IV Piggyback:450] Out: 6295 [Urine:1040]  PHYSICAL EXAMINATION: General:  Elderly male  HEENT: MM pink/moist, ETT 7.5, 23 at the teeth, Pupils 3/reactive Neuro: awake & follows commands, moves all 4 ES CV:  IRR, no murmur PULM: even/non-labored , no rhonchi GI: soft, bs active  Extremities: cool/dry, no edema  Skin: no rashes or lesions  LABS:  BMET  Recent Labs Lab 09/20/16 1727 09/21/16 0439 09/22/16 0408  NA 139 139 138  K 4.5 4.2 3.8  CL 110 111 111  CO2 21* 19* 22  BUN 15 20 31*  CREATININE 1.31* 1.42* 1.18  GLUCOSE 146* 111* 171*    Electrolytes  Recent Labs Lab 09/20/16 1727 09/21/16 0439 09/21/16 1643 09/22/16 0408  CALCIUM 7.6* 7.5*  --  7.5*  MG 1.7  --  2.2 2.1  PHOS  --   --  2.5 1.7*    CBC  Recent Labs Lab 09/20/16 1727 09/21/16 0439 09/22/16 0408  WBC 13.9* 13.1* 12.7*  HGB 14.6 13.9 11.8*  HCT 43.9 42.7 35.4*  PLT 219 203 156    Coag's  Recent Labs Lab 09/20/16 0245  INR 1.20    Sepsis Markers  Recent Labs Lab 09/21/16 2841  09/22/16 0408  PROCALCITON 0.64 0.54    ABG  Recent Labs Lab 09/20/16 1628  PHART 7.339*  PCO2ART 34.3  PO2ART 298*    Liver Enzymes  Recent Labs Lab 09/20/16 0245 09/20/16 1727 09/22/16 0408  AST 72* 187* 103*  ALT 27 89* 53  ALKPHOS 77 74 61  BILITOT 1.1 1.2 1.4*  ALBUMIN 3.6 3.2* 2.4*    Cardiac Enzymes  Recent Labs Lab 09/20/16 0245 09/20/16 1140 09/20/16 1727  TROPONINI 4.20* 6.80* 12.60*    Glucose  Recent Labs Lab 09/21/16 1528 09/21/16 1951 09/21/16 2337 09/22/16 0324 09/22/16 0756  GLUCAP 109* 136* 150* 156* 169*    Imaging Dg Chest Port 1 View  Result Date: 09/22/2016 CLINICAL DATA:  Respiratory failure EXAM: PORTABLE CHEST 1 VIEW COMPARISON:  09/21/2016  FINDINGS: Interval placement of NG tube which is in a large hiatal hernia. Remainder the support devices are unchanged. Bibasilar atelectasis. No definite effusions. No acute bony abnormality. IMPRESSION: NG tube in a large hiatal hernia. Bibasilar atelectasis. Electronically Signed   By: Rolm Baptise M.D.   On: 09/22/2016 07:27   Dg Abd Portable 1v  Result Date: 09/21/2016 CLINICAL DATA:  NG tube placement. EXAM: PORTABLE ABDOMEN - 1 VIEW COMPARISON:  CT scan dated 08/17/2003 FINDINGS: There is an NG tube with the tip in a large hiatal hernia above the diaphragm. The entire fundus of the stomach appears to be herniated. There is a single slightly prominent small bowel loop in the left mid abdomen. Bowel gas pattern is otherwise normal. Rotoscoliosis of the lumbar spine. IMPRESSION: NG tube tip in the fundus of the stomach, in a large hiatal hernia. Electronically Signed   By: Lorriane Shire M.D.   On: 09/21/2016 11:30     DISCUSSION: 54 yoM with known CAD admitted with chest pain, NSTEMI, and new onset afib that suffered inpatient Vfib arrest for 6 mins. Appears to be stressca rdiomyopathy, neuro intact  ASSESSMENT / PLAN:  PULMONARY A: Acute respiratory failure in the setting of Cardiac Arrest  R/o aspiration PNA P:   Tolerates SBT  5/5 , proceed with extubation   CARDIOVASCULAR A:  VT -> Vfib arrest  W/~10 mins CPR, ROSC < 15 mins Cardiogenic shock - Favor takotsubo cardiomyopathy NSTEMI/ CAD- LH New onset Afib converted to to SB/ 2nd AV MT1 block on amio Prolonged QTc Hx CAD with prior MI and PCI in 2011  P:  Tele Levophed off Amiodarone gtt d/c'd Continue Heparin gtt   dc CVP  RENAL A:   AKI - r/t hypotension vs contrast nephropathy , resolving Hypomag/hypophos  P:   Trend BMET/ mag/ phos Strict I/O's Replace electrolytes as indicated  GASTROINTESTINAL A:   Mild transaminitis, trending down, favor shock liver Large hiatal hernia P:   Ct Protonix Advance PO  once extubated  HEMATOLOGIC A:   Leukocytosis P:  Trend CBC Heparin gtt as above  INFECTIOUS A:   Leukocytosis, fever - ? Early infiltrate on CXR; ? Aspiration during CPR, was NPO prior to cardiac arrest for cath  P:   Empiric Unasyn for now->deesculate soon since low pct  ENDOCRINE A:   No acute issues P:   CBG q 4  NEUROLOGIC A:   Acute encephalopathy related to Cardiac Arrest/ sedation , resolved, appears intact  P:   Dc sedation PT consult  FAMILY  - Updates: Granddaughter updated at bedside.   - Inter-disciplinary family meet or Palliative Care meeting due by:  09/27/2016  My cctime x 21m  Kara Mead MD. Shade Flood. [place] [facility] Pager [phone] If no response call [phone]    09/22/2016, 8:19 AM

## 2016-09-22 NOTE — Procedures (Signed)
Extubation Procedure Note  Patient Details:   Name: Lonnie Boyd DOB: November 14, 1940 MRN: [id number]   Airway Documentation:   + air leak test prior to extubation.  Evaluation  O2 sats: stable throughout Complications: No apparent complications Patient did tolerate procedure well. Bilateral Breath Sounds: Clear, Diminished   Yes, pt able to speak.  No stridor noted, no distress noted.  VSS  Lenna Sciara 09/22/2016, 9:03 AM

## 2016-09-22 NOTE — Progress Notes (Signed)
Dr. Irish Lack made aware of bursts of afib up into the 130s, and then dropping back into the 100s-110s. BP stable. Patient asleep. Dr. Irish Lack came to bedside. Will continue to monitor at this time. Richarda Blade RN

## 2016-09-22 NOTE — Progress Notes (Signed)
[facility] ADULT ICU REPLACEMENT PROTOCOL FOR AM LAB REPLACEMENT ONLY  The patient does apply for the [facility] Adult ICU Electrolyte Replacment Protocol based on the criteria listed below:   1. Is GFR >/= 40 ml/min? Yes.    Patient's GFR today is >60 2. Is urine output >/= 0.5 ml/kg/hr for the last 6 hours? Yes.   Patient's UOP is 0.55 ml/kg/hr 3. Is BUN < 60 mg/dL? Yes.    Patient's BUN today is 31 4. Abnormal electrolyte(s): phos 1.7 5. Ordered repletion with:sodium Phos 20 mm01 6. If a panic level lab has been reported, has the CCM MD in charge been notified? Yes.  .   Physician:  Treasa School, Cindra Presume 09/22/2016 6:25 AM

## 2016-09-22 NOTE — Progress Notes (Signed)
Brief Cortrak Team Note:  Consult received for Cortrak Tube Placement. Spoke with Nira Conn RN this AM. Pt s/p extubated this AM and Cortrak tube no longer needed.  Order discontinued  Kerman Passey MS, [place], LDN [phone] Pager  [phone] Weekend/On-Call Pager

## 2016-09-22 NOTE — Progress Notes (Signed)
Up out of bed x3 today, will need a PT/OT eval/treat. Patient is a 2 assist to stand and pivot, very weak on feet.   Rowe Pavy, RN

## 2016-09-22 NOTE — Progress Notes (Signed)
Progress Note  Patient Name: Lonnie Boyd Date of Encounter: 09/22/2016  Primary Cardiologist: Agustin Cree  Subjective   Substantial improvement in last 24 hours.  Off inotropes. Excellent cardiac output based on Co-ox. Extubated. Alert, oriented, conversant. No obvious neurological deficits. Remains in atrial fibrillation. No further ventricular arrhythmia.  No further fever. Maintaining good urine output, roughly 1.5 L positive since admission. Renal parameters improving. Transaminases also trending down.   Inpatient Medications    Scheduled Meds: . aspirin  81 mg Per Tube Daily  . atorvastatin  80 mg Oral q1800  . chlorhexidine gluconate (MEDLINE KIT)  15 mL Mouth Rinse BID  . Chlorhexidine Gluconate Cloth  6 each Topical Daily  . clopidogrel  75 mg Per Tube Q breakfast  . mouth rinse  15 mL Mouth Rinse 10 times per day  . pantoprazole  40 mg Oral Q1200  . sodium chloride flush  10-40 mL Intracatheter Q12H  . sodium chloride flush  3 mL Intravenous Q12H   Continuous Infusions: . sodium chloride    . ampicillin-sulbactam (UNASYN) IV Stopped (09/22/16 0446)  . heparin 1,200 Units/hr (09/22/16 0800)  . norepinephrine (LEVOPHED) Adult infusion Stopped (09/22/16 0700)  . sodium phosphate  Dextrose 5% IVPB 20 mmol (09/22/16 0652)   PRN Meds: sodium chloride, acetaminophen, bisacodyl, docusate, nitroGLYCERIN, ondansetron (ZOFRAN) IV, oxyCODONE, sodium chloride flush, sodium chloride flush   Vital Signs    Vitals:   09/22/16 0758 09/22/16 0800 09/22/16 0857 09/22/16 0900  BP:  118/77    Pulse:  98 99   Resp:  16 (!) 21   Temp: 99.2 F (37.3 C)     TempSrc: Oral     SpO2:  97% 98% 97%  Weight:      Height:        Intake/Output Summary (Last 24 hours) at 09/22/16 0942 Last data filed at 09/22/16 8242  Gross per 24 hour  Intake          2149.59 ml  Output              655 ml  Net          1494.59 ml   Filed Weights   09/20/16 0149 09/20/16 1014 09/22/16  0315  Weight: 172 lb 9.6 oz (78.3 kg) 172 lb 9.9 oz (78.3 kg) 178 lb 4.8 oz (80.9 kg)    Telemetry    Atrial fibrillation with generally controlled ventricular rate, currently RVR shortly after extubation - Personally Reviewed QT interval still appears prolonged based on telemetry, harder to measure due to atrial fibrillation with coarse atrial activity ECG    No new tracing - Personally Reviewed  Physical Exam  Alert, oriented, calm, breathing slowly GEN: No acute distress.   Neck: No JVD Cardiac:  irregular, no murmurs, rubs, or gallops.  Respiratory: Clear to auscultation bilaterally. GI: Soft, nontender, non-distended  MS: No edema; No deformity. Neuro:  Nonfocal  Psych: Normal affect   Labs    Chemistry Recent Labs Lab 09/20/16 0245 09/20/16 1727 09/21/16 0439 09/22/16 0408  NA 139 139 139 138  K 3.9 4.5 4.2 3.8  CL 107 110 111 111  CO2 24 21* 19* 22  GLUCOSE 90 146* 111* 171*  BUN '14 15 20 ' 31*  CREATININE 1.09 1.31* 1.42* 1.18  CALCIUM 8.7* 7.6* 7.5* 7.5*  PROT 6.1* 5.9*  --  4.9*  ALBUMIN 3.6 3.2*  --  2.4*  AST 72* 187*  --  103*  ALT 27 89*  --  53  ALKPHOS 77 74  --  61  BILITOT 1.1 1.2  --  1.4*  GFRNONAA >60 50* 46* 57*  GFRAA >60 58* 53* >60  ANIONGAP '8 8 9 5     ' Hematology Recent Labs Lab 09/20/16 1727 09/21/16 0439 09/22/16 0408  WBC 13.9* 13.1* 12.7*  RBC 4.56 4.46 3.71*  HGB 14.6 13.9 11.8*  HCT 43.9 42.7 35.4*  MCV 96.3 95.7 95.4  MCH 32.0 31.2 31.8  MCHC 33.3 32.6 33.3  RDW 14.5 14.8 15.3  PLT 219 203 156    Cardiac Enzymes Recent Labs Lab 09/20/16 0245 09/20/16 1140 09/20/16 1727  TROPONINI 4.20* 6.80* 12.60*   No results for input(s): TROPIPOC in the last 168 hours.   BNP Recent Labs Lab 09/20/16 0245  BNP 556.1*     DDimer No results for input(s): DDIMER in the last 168 hours.   Radiology    X-ray Chest Pa And Lateral  Result Date: 09/20/2016 CLINICAL DATA:  Chest discomfort. EXAM: CHEST  2 VIEW  COMPARISON:  09/19/2016 FINDINGS: Cardiomegaly. There are bibasilar airspace opacities which are stable since prior study and dating back to 2017, likely scarring. No definite acute airspace opacity. No effusion. No acute bony abnormality. IMPRESSION: Stable bibasilar densities, likely bibasilar scarring. Mild cardiomegaly. Electronically Signed   By: Rolm Baptise M.D.   On: 09/20/2016 10:02   Dg Chest Port 1 View  Result Date: 09/22/2016 CLINICAL DATA:  Respiratory failure EXAM: PORTABLE CHEST 1 VIEW COMPARISON:  09/21/2016 FINDINGS: Interval placement of NG tube which is in a large hiatal hernia. Remainder the support devices are unchanged. Bibasilar atelectasis. No definite effusions. No acute bony abnormality. IMPRESSION: NG tube in a large hiatal hernia. Bibasilar atelectasis. Electronically Signed   By: Rolm Baptise M.D.   On: 09/22/2016 07:27   Portable Chest Xray  Result Date: 09/21/2016 CLINICAL DATA:  Intubated patient, respiratory failure, cardiomyopathy, NSTEMI, cardiac arrest. EXAM: PORTABLE CHEST 1 VIEW COMPARISON:  Portable chest x-ray of September 20, 2016 FINDINGS: The right lung is reasonably well inflated. There is increased density at the lung bases not significantly changed from yesterday's study. There is a small left pleural effusion and trace right pleural effusion. The heart and pulmonary vascularity are normal. External pacemaker defibrillator pads are present. The endotracheal tube tip lies approximately 3.3 cm above the carina. The right subclavian venous catheter tip projects over the midportion of the SVC. The bony thorax exhibits no acute abnormality. IMPRESSION: Persistent bibasilar densities which may reflect atelectasis or pneumonia. Small left and trace right pleural effusions. The support tubes are in reasonable position. There is no overt CHF. Electronically Signed   By: David  Martinique M.D.   On: 09/21/2016 07:46   Dg Chest Port 1 View  Result Date: 09/20/2016 CLINICAL  DATA:  Central line placement EXAM: PORTABLE CHEST 1 VIEW COMPARISON:  Portable exam 1652 hours compared to 09/20/2016 FINDINGS: Tip of endotracheal tube projects 3.7 cm above carina. RIGHT subclavian central venous catheter newly identified with tip projecting over SVC near cavoatrial junction. External pacing leads and EKG leads project over chest. Enlargement of cardiac silhouette. Mediastinal contours and pulmonary vascularity normal. Large bleb at the medial RIGHT lung base again identified. Bibasilar atelectasis versus infiltrate increased on LEFT. No gross pleural effusion or pneumothorax. IMPRESSION: No pneumothorax following RIGHT subclavian line placement. BILATERAL lower lobe opacities question atelectasis versus infiltrate increased on LEFT since previous exam. Electronically Signed   By: Lavonia Dana M.D.   On: 09/20/2016 17:15  Dg Chest Port 1 View  Result Date: 09/20/2016 CLINICAL DATA:  Orogastric tube placement and endotracheal tube placement. EXAM: PORTABLE CHEST 1 VIEW COMPARISON:  Radiograph of same day. FINDINGS: Stable cardiomediastinal silhouette. Endotracheal tube is seen projected over tracheal air shadow with distal tip 3 cm above the carina. Interval placement of orogastric tube with tip coiled in the large hiatal hernia above the right hemidiaphragm. No pneumothorax or significant pleural effusion is noted. Stable bibasilar atelectasis or edema is noted. Bony thorax is unremarkable. IMPRESSION: Endotracheal tube in grossly good position. Distal tip of orogastric tube is seen in large hiatal hernia above right hemidiaphragm. Stable bibasilar atelectasis or edema. Electronically Signed   By: Marijo Conception, M.D.   On: 09/20/2016 14:01   Dg Abd Portable 1v  Result Date: 09/21/2016 CLINICAL DATA:  NG tube placement. EXAM: PORTABLE ABDOMEN - 1 VIEW COMPARISON:  CT scan dated 08/17/2003 FINDINGS: There is an NG tube with the tip in a large hiatal hernia above the diaphragm. The entire  fundus of the stomach appears to be herniated. There is a single slightly prominent small bowel loop in the left mid abdomen. Bowel gas pattern is otherwise normal. Rotoscoliosis of the lumbar spine. IMPRESSION: NG tube tip in the fundus of the stomach, in a large hiatal hernia. Electronically Signed   By: Lorriane Shire M.D.   On: 09/21/2016 11:30    Cardiac Studies   Echo 09/20/2016 - Left ventricle: The cavity size was normal. There was mild concentric hypertrophy. Systolic function was severely reduced. The estimated ejection fraction was in the range of 20% to 25%. Features are consistent with a pseudonormal left ventricular filling pattern, with concomitant abnormal relaxation and increased filling pressure (grade 2 diastolic dysfunction). Doppler parameters are consistent with elevated ventricular end-diastolic filling pressure. - Mitral valve: There was mild regurgitation. - Right ventricle: The cavity size was moderately dilated. Wall thickness was normal. Systolic function was severely reduced. - Right atrium: The atrium was normal in size. - Tricuspid valve: There was moderate regurgitation. - Pulmonary arteries: Systolic pressure was moderately increased. PA peak pressure: 42 mm Hg (S). - Inferior vena cava: The vessel was dilated. The respirophasic diameter changes were blunted (<50%), consistent with elevated central venous pressure. - Pericardium, extracardiac: There was no pericardial effusion.  Impressions:  - Severe LV dysfunction with LVEF 15-20% with akinesis of all of the apical and mid segments. Moderate RV systolic dysfunction.  CATH 09/20/2016   20-30% mid body stenosis of the left main. This seemed to decrease in severity as the procedure progressed suggesting catheter-induced spasm.  Dominant right coronary with 40% mid vessel stenosis and tandem 60% PDA stenoses.  Large left anterior descending which wraps around the left  ventricular apex. The vessel contains a proximal to mid stent. There is diffuse 40-50% in-stent restenosis. No critical obstruction is noted. A diagonal branch is seen to fill very late and the stump is not definable.  The circumflex gives origin to multiple obtuse marginal branches. A proximal to mid stent contains diffuse 40% restenosis. At the proximal stent margin there is eccentric 65-75% restenosis. The lesion does not account for the wall motion abnormality.   Severe left ventricular systolic dysfunction, with normal LVEDP of 13 mmHg. 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  Acute systolic heart failure versus chronic systolic heart failure will depend upon pre-existing knowledge about LV function.  RECOMMENDATIONS:   No acute intervention is recommended at this time.  Obtained prior cath and LV function data.  Continue amiodarone.  Continue IV heparin.  Start Plavix.   Echocardiogram.   Further testing and possibly percutaneous intervention on the proximal circumflex depending upon course.      Resting               Left Heart   Left Ventricle The left ventricle is dilated. There is severe left ventricular systolic dysfunction. LV end diastolic pressure is normal. The left ventricular ejection fraction is 25-35% by visual estimate. There are LV function abnormalities due to segmental dysfunction. There is no evidence of mitral regurgitation.    Coronary Diagrams   Diagnostic Diagram          Patient Profile     76 y.o. male with known CAD and previous PCI to LAD and LACx, borderline LVEF 50% in May 2018, presenting with newly diagnosed atrial fibrillation and apparent NSTEMI (troponin 4), shortly thereafter had polymorphic VT arrest requiring CPR/defib/intubation, subsequently found to have stable coronary anatomy and new extensive LV wall motion abnormalities suggestive of takotsubo sd, with  relatively mild troponin increase (12). Had transient cardiogenic shock and mild acute kidney injury and elevation in transaminases.  Assessment & Plan    1. Cardiogenic shock: Resolved. Still not clear whether the myocardial stunning cause the cardiac arrest or vice versa. Avoid beta blockers for now. Recheck echo tomorrow for wall motion and overall EF  2. VT:  No recurrence. Amio stopped due to QT prolongation and 2nd deg MT1 block. 3. Long QT: QT, which was normal at admission, has prolonged substantially, whether due to ischemia/takotsubo sd or amiodarone. Will ask EP opinion about continuing amiodarone, as well as risk stratification for recurrent arrhythmia (Life Vest versus ICD), after we get results of follow-up echo. Will recheck a 12-lead ECG for better QT measurement 4. CAD/NSTEMI: wall motion abnormality is disproportionate to small troponin leak, consistent with extensive stunned myocardium. No revascularization was performed. If this is considered a NSTEMI, ICD should wait 40 days. 5. AFib: present on admission and asymptomatic. CHADSVasc 5 (age 40, CHF, CAD, HTN). Converted to SR while on IV amiodarone, will need DOAC at DC. Keep on IV heparin for now. 6. 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 Now back in atrial fibrillation, mild RVR. He had been on metoprolol succinate 50 mg daily prior to this admission, without reported signs or symptoms of bradycardia. Plan to resume after EF improves  7. HLP: excellent lipids and A1c. 8. AKI: post arrest, resolving 9. Elevated transaminases: a little bit of "shock liver", improving.  Signed, Sanda Klein, MD  09/22/2016, 9:42 AM

## 2016-09-22 NOTE — Progress Notes (Signed)
ANTICOAGULATION CONSULT NOTE - Follow Up Consult  Pharmacy Consult for heparin Indication: cardiogenic shock/stress cardiomyopathy/non-STEMI  Labs:  Recent Labs  09/20/16 0245  09/20/16 1140 09/20/16 1727 09/21/16 0439 09/21/16 0441 09/21/16 1211 09/22/16 0408  HGB 14.4  --   --  14.6 13.9  --   --  11.8*  HCT 42.9  --   --  43.9 42.7  --   --  35.4*  PLT 193  --   --  219 203  --   --  156  LABPROT 15.3*  --   --   --   --   --   --   --   INR 1.20  --   --   --   --   --   --   --   HEPARINUNFRC  --   < >  --   --   --  0.31 0.54 0.19*  CREATININE 1.09  --   --  1.31* 1.42*  --   --   --   TROPONINI 4.20*  --  6.80* 12.60*  --   --   --   --   < > = values in this interval not displayed.   Assessment: 76yo male now subtherapeutic on heparin after two levels at goal, no gtt issues per RN; Hgb down but RN notes no overt signs of bleeding.  Goal of Therapy:  Heparin level 0.3-0.7 units/ml   Plan:  Will increase heparin gtt by 2-3 units/kg/hr to 1200 units/hr and check level in [place], PharmD, BCPS  09/22/2016,5:26 AM

## 2016-09-23 ENCOUNTER — Inpatient Hospital Stay (HOSPITAL_COMMUNITY): Payer: Medicare Other

## 2016-09-23 DIAGNOSIS — I509 Heart failure, unspecified: Secondary | ICD-10-CM

## 2016-09-23 DIAGNOSIS — I214 Non-ST elevation (NSTEMI) myocardial infarction: Secondary | ICD-10-CM

## 2016-09-23 DIAGNOSIS — J9600 Acute respiratory failure, unspecified whether with hypoxia or hypercapnia: Secondary | ICD-10-CM

## 2016-09-23 LAB — CBC
HCT: 31.9 % — ABNORMAL LOW (ref 39.0–52.0)
HEMOGLOBIN: 10.4 g/dL — AB (ref 13.0–17.0)
MCH: 31.1 pg (ref 26.0–34.0)
MCHC: 32.6 g/dL (ref 30.0–36.0)
MCV: 95.5 fL (ref 78.0–100.0)
PLATELETS: 144 10*3/uL — AB (ref 150–400)
RBC: 3.34 MIL/uL — AB (ref 4.22–5.81)
RDW: 15 % (ref 11.5–15.5)
WBC: 11.8 10*3/uL — ABNORMAL HIGH (ref 4.0–10.5)

## 2016-09-23 LAB — CULTURE, RESPIRATORY
CULTURE: NORMAL
SPECIAL REQUESTS: NORMAL

## 2016-09-23 LAB — BASIC METABOLIC PANEL
ANION GAP: 5 (ref 5–15)
BUN: 23 mg/dL — ABNORMAL HIGH (ref 6–20)
CALCIUM: 7.7 mg/dL — AB (ref 8.9–10.3)
CO2: 24 mmol/L (ref 22–32)
CREATININE: 0.98 mg/dL (ref 0.61–1.24)
Chloride: 108 mmol/L (ref 101–111)
GLUCOSE: 122 mg/dL — AB (ref 65–99)
Potassium: 3.3 mmol/L — ABNORMAL LOW (ref 3.5–5.1)
Sodium: 137 mmol/L (ref 135–145)

## 2016-09-23 LAB — CULTURE, RESPIRATORY W GRAM STAIN

## 2016-09-23 LAB — GLUCOSE, CAPILLARY
GLUCOSE-CAPILLARY: 136 mg/dL — AB (ref 65–99)
Glucose-Capillary: 108 mg/dL — ABNORMAL HIGH (ref 65–99)
Glucose-Capillary: 114 mg/dL — ABNORMAL HIGH (ref 65–99)
Glucose-Capillary: 120 mg/dL — ABNORMAL HIGH (ref 65–99)

## 2016-09-23 LAB — ECHOCARDIOGRAM LIMITED
Height: 68 in
Weight: 2848 oz

## 2016-09-23 LAB — HEPARIN LEVEL (UNFRACTIONATED)
HEPARIN UNFRACTIONATED: 0.47 [IU]/mL (ref 0.30–0.70)
Heparin Unfractionated: 0.29 IU/mL — ABNORMAL LOW (ref 0.30–0.70)

## 2016-09-23 LAB — MAGNESIUM: Magnesium: 1.9 mg/dL (ref 1.7–2.4)

## 2016-09-23 LAB — PHOSPHORUS: PHOSPHORUS: 1.8 mg/dL — AB (ref 2.5–4.6)

## 2016-09-23 LAB — PROCALCITONIN: PROCALCITONIN: 0.38 ng/mL

## 2016-09-23 MED ORDER — POTASSIUM PHOSPHATES 15 MMOLE/5ML IV SOLN
30.0000 mmol | Freq: Once | INTRAVENOUS | Status: AC
  Administered 2016-09-23: 30 mmol via INTRAVENOUS
  Filled 2016-09-23: qty 10

## 2016-09-23 NOTE — Plan of Care (Signed)
Problem: Activity: Goal: Ability to return to baseline activity level will improve Outcome: Progressing Needs PT consult

## 2016-09-23 NOTE — Care Management Note (Signed)
Case Management Note  Patient Details  Name: Lonnie Boyd MRN: [id number] Date of Birth: 05/27/1940  Subjective/Objective:  From home presents with chest pain, NSTEMI, and new onset afib that suffered inpatient Vfib arrest for 6 mins.  S/p left heart cath and angiography, no intervention at this time.  He will be on plavix.                  Action/Plan: NCM will follow for dc needs.   Expected Discharge Date:                  Expected Discharge Plan:     In-House Referral:     Discharge planning Services  CM Consult  Post Acute Care Choice:    Choice offered to:     DME Arranged:    DME Agency:     HH Arranged:    HH Agency:     Status of Service:  In process, will continue to follow  If discussed at Long Length of Stay Meetings, dates discussed:    Additional Comments:  Zenon Mayo, RN 09/23/2016, 10:22 PM

## 2016-09-23 NOTE — Progress Notes (Signed)
  Echocardiogram 2D Echocardiogram limited has been performed.  Jennette Dubin 09/23/2016, 12:10 PM

## 2016-09-23 NOTE — Progress Notes (Signed)
Pt to transfer to telemetry unit. Report called to Marya Amsler, RN on unit 3 [place], pt to transfer to [phone]. Son [name] here at bedside and informed of new room number as well as pt informed. Pt eating lunch at this time, when transfer after completing lunch.

## 2016-09-23 NOTE — Progress Notes (Signed)
Pt transferred via wheelchair on telemetry with belongings, escorted by NT and RN to unit 3W, room 3W14. Son [name] at pt's side.

## 2016-09-23 NOTE — Progress Notes (Signed)
ANTICOAGULATION CONSULT NOTE - Follow Up Consult  Pharmacy Consult for heparin Indication: cardiogenic shock/stress cardiomyopathy/non-STEMI  Labs:  Recent Labs  09/20/16 1140  09/20/16 1727 09/21/16 0439  09/22/16 0408 09/22/16 1356 09/22/16 2348 09/23/16 0406 09/23/16 0918  HGB  --   < > 14.6 13.9  --  11.8*  --   --  10.4*  --   HCT  --   < > 43.9 42.7  --  35.4*  --   --  31.9*  --   PLT  --   < > 219 203  --  156  --   --  144*  --   HEPARINUNFRC  --   --   --   --   < > 0.19* 0.23* 0.29*  --  0.47  CREATININE  --   < > 1.31* 1.42*  --  1.18  --   --  0.98  --   TROPONINI 6.80*  --  12.60*  --   --   --   --   --   --   --   < > = values in this interval not displayed.   Assessment: 76yo male now therapeutic on heparin after rate changes this morning; RN notes no overt signs of bleeding.  Goal of Therapy:  Heparin level 0.3-0.7 units/ml   Plan:  Continue heparin at 1500 units/hr Daily Heparin level and cbc  Erin Hearing PharmD., BCPS Clinical Pharmacist Pager [phone] 09/23/2016 10:54 AM

## 2016-09-23 NOTE — Progress Notes (Signed)
PULMONARY / CRITICAL CARE MEDICINE   Name: Lonnie Boyd MRN: [id number] DOB: August 08, 1940    ADMISSION DATE:  09/20/2016 CONSULTATION DATE:  09/20/2016  REFERRING MD:  Dr. Sallyanne Kuster  CHIEF COMPLAINT:  Cardiac arrest  HISTORY OF PRESENT ILLNESS:   76 year old male with PMH of CAD with prior MI admitted on 8/27 with NSTEMI and new onset afib.  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   8/27 am  developed polymorphic VT that deteriorated to VF.  CPR was given for 10 mins, defibrillated once, epinephrine twice, and amiodarone 300 mg bolus given with ROSC less than 15 minutes to slow atrial fib rhythm.  Patient was moaning/shouting and making some purposeful movement afterwards before becoming agonal requiring intubation by anesthesia for airway protection.  Patient taken emergently to the cath lab by Cardiology.   STUDIES:  TTE 8/27 >> Severe LV dysfunction with LVEF 15-20% with akinesis of all of the apical and mid segments. Moderate RV systolic dysfunction; PAP 42 LHC 8/27 >> patent stents  Severe left ventricular systolic dysfunction, with normal LVEDP of 13 mmHg. 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   CULTURES: 8/27 MRSA PCR >> neg 8/28 BCx2 >> 8/28 UC >>ng 8/28 sputum >>  ANTIBIOTICS: 8/28 >> Unasyn >>  SIGNIFICANT EVENTS: 8/27 admit/ cardiac arrest , levophed gtt 8/28  Bladder retention > 600 ml, foley placed Febrile >> unasyn Amio stopped per Cards for bradycardia/ 2nd AV block   LINES/TUBES: 8/27 ETT >> 8/29  SUBJECTIVE:  Extubated, has done well afebrile  VITAL SIGNS: BP (!) 91/56   Pulse (!) 105   Temp 98.6 F (37 C) (Oral)   Resp 20   Ht 5\' 8"  (1.727 m)   Wt 178 lb (80.7 kg)   SpO2 98%   BMI 27.06 kg/m   HEMODYNAMICS: CVP:  [7 mmHg] 7 mmHg VENTILATOR SETTINGS:   INTAKE / OUTPUT: I/O last 3 completed shifts: In: 2058.5 [I.V.:515.8; NG/GT:942.7; IV Piggyback:600] Out:  4268 [Urine:1395]  PHYSICAL EXAMINATION: General:  Elderly male lying in bed HEENT: MM pink/moist,  Pupils 3/reactive, very hard of hearing Neuro: awake & interactive, non focal CV:  IRR, no murmur PULM: even/non-labored , no rhonchi GI: soft, bs active  Extremities: cool/dry, no edema  Skin: no rashes or lesions  LABS:  BMET  Recent Labs Lab 09/21/16 0439 09/22/16 0408 09/23/16 0406  NA 139 138 137  K 4.2 3.8 3.3*  CL 111 111 108  CO2 19* 22 24  BUN 20 31* 23*  CREATININE 1.42* 1.18 0.98  GLUCOSE 111* 171* 122*    Electrolytes  Recent Labs Lab 09/21/16 0439 09/21/16 1643 09/22/16 0408 09/23/16 0406  CALCIUM 7.5*  --  7.5* 7.7*  MG  --  2.2 2.1 1.9  PHOS  --  2.5 1.7* 1.8*    CBC  Recent Labs Lab 09/21/16 0439 09/22/16 0408 09/23/16 0406  WBC 13.1* 12.7* 11.8*  HGB 13.9 11.8* 10.4*  HCT 42.7 35.4* 31.9*  PLT 203 156 144*    Coag's  Recent Labs Lab 09/20/16 0245  INR 1.20    Sepsis Markers  Recent Labs Lab 09/21/16 0927 09/22/16 0408 09/23/16 0406  PROCALCITON 0.64 0.54 0.38    ABG  Recent Labs Lab 09/20/16 1628  PHART 7.339*  PCO2ART 34.3  PO2ART 298*    Liver Enzymes  Recent Labs Lab 09/20/16 0245 09/20/16 1727 09/22/16 0408  AST 72* 187* 103*  ALT 27 89* 53  ALKPHOS 77 74 61  BILITOT 1.1 1.2 1.4*  ALBUMIN 3.6 3.2* 2.4*    Cardiac Enzymes  Recent Labs Lab 09/20/16 0245 09/20/16 1140 09/20/16 1727  TROPONINI 4.20* 6.80* 12.60*    Glucose  Recent Labs Lab 09/22/16 0756 09/22/16 1225 09/22/16 1545 09/23/16 0012 09/23/16 0434 09/23/16 0757  GLUCAP 169* 131* 153* 114* 120* 108*    Imaging No results found.   DISCUSSION: 77 yoM with known CAD admitted with chest pain, NSTEMI, and new onset afib that suffered inpatient Vfib arrest for 6 mins. Appears to be stressca rdiomyopathy, neuro intact  ASSESSMENT / PLAN:  PULMONARY A: Acute respiratory failure in the setting of Cardiac Arrest   P:   resolved    CARDIOVASCULAR A:  VT -> Vfib arrest  W/~10 mins CPR, ROSC < 15 mins Cardiogenic shock - Favor takotsubo cardiomyopathy NSTEMI/ CAD- LH New onset Afib converted to to SB/ 2nd AV MT1 block on amio -RVR 8/30 Prolonged QTc Hx CAD with prior MI and PCI in 2011  P:  Tele Levophed off , low dose metoprolol Amiodarone gtt d/c'd Continue Heparin gtt    RENAL A:   AKI - r/t hypotension vs contrast nephropathy , resolving Hypomag/hypophos  P:   Trend BMET Strict I/O's Replace k-phos  GASTROINTESTINAL A:   Mild transaminitis, trending down, favor shock liver Large hiatal hernia P:   Ct Protonix Advance PO   HEMATOLOGIC A:   Leukocytosis P:  Trend CBC Heparin gtt per cards  INFECTIOUS A:   Leukocytosis, fever - ? Early infiltrate on CXR; ? Aspiration during CPR, was NPO prior to cardiac arrest for cath  P:   Empiric Unasyn for now->can dc if resp cx remains neg  ENDOCRINE A:   No acute issues P:   Dc CBGs   NEUROLOGIC A:   Acute encephalopathy related to Cardiac Arrest/ sedation , resolved, appears intact  P:    PT consult  PCCM available as needed, can transfer to tele    Kara Mead MD. FCCP. [facility] Pager [phone] If no response call [phone]    09/23/2016, 8:24 AM

## 2016-09-23 NOTE — Evaluation (Signed)
Physical Therapy Evaluation Patient Details Name: Lonnie Boyd MRN: [id number] DOB: 1940/04/17 Today's Date: 09/23/2016   History of Present Illness  76 y.o. male with known CAD and previous PCI to LAD and LACx, borderline LVEF 50% in May 2018, presenting with newly diagnosed atrial fibrillation and apparent NSTEMI (troponin 4), shortly thereafter had polymorphic VT arrest requiring CPR/defib/intubation, subsequently found to have stable coronary anatomy and new extensive LV wall motion abnormalities suggestive of takotsubo sd, with relatively mild troponin increase (12). Had transient cardiogenic shock and mild acut76 y.o. male with known CAD and previous PCI to LAD and LACx, borderline LVEF 50% in May 2018, presenting with newly diagnosed atrial fibrillation and apparent NSTEMI (troponin 4), shortly thereafter had polymorphic VT arrest requiring CPR/defib/intubation, subsequently found to have stable coronary anatomy and new extensive LV wall motion abnormalities suggestive of takotsubo sd, with relatively mild troponin increase (12). Had transient cardiogenic shock and mild acute kidney injury and elevation in transaminases.e kidney injury and elevation in transaminases.  Clinical Impression  Pt admitted with above diagnosis. Pt currently with functional limitations due to the deficits listed below (see PT Problem List). Pt is modA for bed mobility, minAx2 for transfer and modAx2 for ambulation of 8 feet with handhold assist. Pt will benefit from skilled PT to increase their independence and safety with mobility to allow discharge to the venue listed below.       Follow Up Recommendations Home health PT;Supervision/Assistance - 24 hour    Equipment Recommendations  Rolling walker with 5" wheels;3in1 (PT)    Recommendations for Other Services OT consult     Precautions / Restrictions        Mobility  Bed Mobility Overal bed mobility: Needs Assistance Bed Mobility: Supine to Sit      Supine to sit: Mod assist     General bed mobility comments: modA for mangement of LE to floor and trunk to upright   Transfers Overall transfer level: Needs assistance Equipment used: 2 person hand held assist Transfers: Sit to/from Stand Sit to Stand: +2 safety/equipment;Min assist;From elevated surface         General transfer comment: minAx2 for power up and steadying in upright,   Ambulation/Gait Ambulation/Gait assistance: Mod assist;+2 safety/equipment Ambulation Distance (Feet): 8 Feet Assistive device: 2 person hand held assist Gait Pattern/deviations: Step-through pattern;Decreased step length - right;Decreased step length - left;Shuffle;Narrow base of support Gait velocity: slowed Gait velocity interpretation: Below normal speed for age/gender General Gait Details: modA for steadying with gait, vc for wider BoS and upright posture          Balance Overall balance assessment: Needs assistance Sitting-balance support: Feet supported;Single extremity supported Sitting balance-Leahy Scale: Poor     Standing balance support: Bilateral upper extremity supported Standing balance-Leahy Scale: Zero                               Pertinent Vitals/Pain Pain Assessment: 0-10 Pain Score: 3  Pain Location: penis from catheter removal Pain Descriptors / Indicators: Aching;Burning Pain Intervention(s): Monitored during session    Home Living Family/patient expects to be discharged to:: Private residence Living Arrangements: Spouse/significant other Available Help at Discharge: Family;Available 24 hours/day Type of Home: House Home Access: Stairs to enter Entrance Stairs-Rails: Left Entrance Stairs-Number of Steps: 3 Home Layout: One level Home Equipment: None      Prior Function Level of Independence: Independent  Hand Dominance   Dominant Hand: Right    Extremity/Trunk Assessment   Upper Extremity Assessment Upper  Extremity Assessment: Generalized weakness    Lower Extremity Assessment Lower Extremity Assessment: Generalized weakness    Cervical / Trunk Assessment Cervical / Trunk Assessment: Kyphotic  Communication   Communication: No difficulties  Cognition Arousal/Alertness: Awake/alert Behavior During Therapy: WFL for tasks assessed/performed Overall Cognitive Status: Within Functional Limits for tasks assessed                                        General Comments General comments (skin integrity, edema, etc.): Pt on 3L O2 via nasal cannula at entry SaO2 100%O2, supplemental O2 removed for activity, prior to activity BP 105/71, HR 78 bpm, SaO2 on RA 97%O2, RR 18, after activity BP 115/72, HR 85bpm, RR 24, SaO2 on RA 97%, nursing okay with pt remaining on RA         Assessment/Plan    PT Assessment Patient needs continued PT services  PT Problem List Decreased strength;Decreased activity tolerance;Decreased balance;Decreased mobility;Decreased safety awareness;Cardiopulmonary status limiting activity       PT Treatment Interventions DME instruction;Gait training;Functional mobility training;Therapeutic activities;Therapeutic exercise;Stair training;Balance training;Patient/family education    PT Goals (Current goals can be found in the Care Plan section)  Acute Rehab PT Goals Patient Stated Goal: go home to be with his wife PT Goal Formulation: With patient Time For Goal Achievement: 09/30/16 Potential to Achieve Goals: Good    Frequency Min 3X/week    AM-PAC PT "6 Clicks" Daily Activity  Outcome Measure Difficulty turning over in bed (including adjusting bedclothes, sheets and blankets)?: Unable Difficulty moving from lying on back to sitting on the side of the bed? : Unable Difficulty sitting down on and standing up from a chair with arms (e.g., wheelchair, bedside commode, etc,.)?: Unable Help needed moving to and from a bed to chair (including a  wheelchair)?: A Lot Help needed walking in hospital room?: A Lot Help needed climbing 3-5 steps with a railing? : Total 6 Click Score: 8    End of Session Equipment Utilized During Treatment: Gait belt Activity Tolerance: Patient tolerated treatment well Patient left: in chair;with call bell/phone within reach;with nursing/sitter in room;with family/visitor present Nurse Communication: Mobility status PT Visit Diagnosis: Unsteadiness on feet (R26.81);Other abnormalities of gait and mobility (R26.89);Muscle weakness (generalized) (M62.81);Difficulty in walking, not elsewhere classified (R26.2)    Time: 6384-6659 PT Time Calculation (min) (ACUTE ONLY): 30 min   Charges:   PT Evaluation $PT Eval Moderate Complexity: 1 Mod PT Treatments $Gait Training: 8-22 mins   PT G Codes:        Fredrich Cory B. Migdalia Dk PT, DPT Acute Rehabilitation  [phone] Pager [phone]    [place] 09/23/2016, 1:35 PM

## 2016-09-23 NOTE — Progress Notes (Signed)
Progress Note  Patient Name: GRIFFIN GERRARD Date of Encounter: 09/23/2016  Primary Cardiologist: Agustin Cree  Subjective   Mild chest soreness after CPR, otherwise no CV complaints. No further ventricular arrhythmia, QT now normal. Remains in AF with controled rate. BP low normal. Renal parameters back to normal.  Bedside review of echo shows substantial improvement in LV function, EF around 40% with a residual mid-apical lateral wall motion abnormality (in territory of occluded diagonal and jailed OM1? Or just residual stunning?).  Inpatient Medications    Scheduled Meds: . aspirin  81 mg Per Tube Daily  . atorvastatin  80 mg Oral q1800  . Chlorhexidine Gluconate Cloth  6 each Topical Daily  . clopidogrel  75 mg Per Tube Q breakfast  . mouth rinse  15 mL Mouth Rinse BID  . metoprolol tartrate  12.5 mg Oral BID  . pantoprazole  40 mg Oral Q1200  . sodium chloride flush  10-40 mL Intracatheter Q12H  . sodium chloride flush  3 mL Intravenous Q12H   Continuous Infusions: . sodium chloride    . heparin 1,500 Units/hr (09/23/16 0600)  . potassium PHOSPHATE IVPB (mmol) 30 mmol (09/23/16 0952)   PRN Meds: sodium chloride, acetaminophen, albuterol, bisacodyl, docusate, metoprolol tartrate, nitroGLYCERIN, ondansetron (ZOFRAN) IV, oxyCODONE, sodium chloride flush, sodium chloride flush, zolpidem   Vital Signs    Vitals:   09/23/16 0600 09/23/16 0700 09/23/16 0757 09/23/16 1003  BP: (!) 89/55 (!) 91/56  99/70  Pulse: (!) 106 (!) 105  69  Resp: (!) 21 20  16   Temp:   98.6 F (37 C)   TempSrc:   Oral   SpO2: 94% 98%  100%  Weight:      Height:        Intake/Output Summary (Last 24 hours) at 09/23/16 1115 Last data filed at 09/23/16 0600  Gross per 24 hour  Intake           558.96 ml  Output              860 ml  Net          -301.04 ml   Filed Weights   09/20/16 1014 09/22/16 0315 09/23/16 0300  Weight: 172 lb 9.9 oz (78.3 kg) 178 lb 4.8 oz (80.9 kg) 178 lb (80.7 kg)      Telemetry    AFib with controlled rate - Personally Reviewed  ECG    Afib, QTc 414 ms - Personally Reviewed  Physical Exam  Alert, oriented and calm GEN: No acute distress.   Neck: No JVD Cardiac: irregular, no murmurs, rubs, or gallops.  Respiratory: Clear to auscultation bilaterally. GI: Soft, nontender, non-distended  MS: No dependent edema, forearms a little swollen and bruised (from IVs); No deformity. Neuro:  Nonfocal  Psych: Normal affect   Labs    Chemistry Recent Labs Lab 09/20/16 0245 09/20/16 1727 09/21/16 0439 09/22/16 0408 09/23/16 0406  NA 139 139 139 138 137  K 3.9 4.5 4.2 3.8 3.3*  CL 107 110 111 111 108  CO2 24 21* 19* 22 24  GLUCOSE 90 146* 111* 171* 122*  BUN 14 15 20  31* 23*  CREATININE 1.09 1.31* 1.42* 1.18 0.98  CALCIUM 8.7* 7.6* 7.5* 7.5* 7.7*  PROT 6.1* 5.9*  --  4.9*  --   ALBUMIN 3.6 3.2*  --  2.4*  --   AST 72* 187*  --  103*  --   ALT 27 89*  --  53  --  ALKPHOS 77 74  --  61  --   BILITOT 1.1 1.2  --  1.4*  --   GFRNONAA >60 50* 46* 57* >60  GFRAA >60 58* 53* >60 >60  ANIONGAP 8 8 9 5 5      Hematology Recent Labs Lab 09/21/16 0439 09/22/16 0408 09/23/16 0406  WBC 13.1* 12.7* 11.8*  RBC 4.46 3.71* 3.34*  HGB 13.9 11.8* 10.4*  HCT 42.7 35.4* 31.9*  MCV 95.7 95.4 95.5  MCH 31.2 31.8 31.1  MCHC 32.6 33.3 32.6  RDW 14.8 15.3 15.0  PLT 203 156 144*    Cardiac Enzymes Recent Labs Lab 09/20/16 0245 09/20/16 1140 09/20/16 1727  TROPONINI 4.20* 6.80* 12.60*   No results for input(s): TROPIPOC in the last 168 hours.   BNP Recent Labs Lab 09/20/16 0245  BNP 556.1*     DDimer No results for input(s): DDIMER in the last 168 hours.   Radiology    Dg Chest Port 1 View  Result Date: 09/22/2016 CLINICAL DATA:  Respiratory failure EXAM: PORTABLE CHEST 1 VIEW COMPARISON:  09/21/2016 FINDINGS: Interval placement of NG tube which is in a large hiatal hernia. Remainder the support devices are unchanged. Bibasilar  atelectasis. No definite effusions. No acute bony abnormality. IMPRESSION: NG tube in a large hiatal hernia. Bibasilar atelectasis. Electronically Signed   By: Rolm Baptise M.D.   On: 09/22/2016 07:27   Dg Abd Portable 1v  Result Date: 09/21/2016 CLINICAL DATA:  NG tube placement. EXAM: PORTABLE ABDOMEN - 1 VIEW COMPARISON:  CT scan dated 08/17/2003 FINDINGS: There is an NG tube with the tip in a large hiatal hernia above the diaphragm. The entire fundus of the stomach appears to be herniated. There is a single slightly prominent small bowel loop in the left mid abdomen. Bowel gas pattern is otherwise normal. Rotoscoliosis of the lumbar spine. IMPRESSION: NG tube tip in the fundus of the stomach, in a large hiatal hernia. Electronically Signed   By: Lorriane Shire M.D.   On: 09/21/2016 11:30    Cardiac Studies   Echo 09/20/2016 - Left ventricle: The cavity size was normal. There was mild concentric hypertrophy. Systolic function was severely reduced. The estimated ejection fraction was in the range of 20% to 25%. Features are consistent with a pseudonormal left ventricular filling pattern, with concomitant abnormal relaxation and increased filling pressure (grade 2 diastolic dysfunction). Doppler parameters are consistent with elevated ventricular end-diastolic filling pressure. - Mitral valve: There was mild regurgitation. - Right ventricle: The cavity size was moderately dilated. Wall thickness was normal. Systolic function was severely reduced. - Right atrium: The atrium was normal in size. - Tricuspid valve: There was moderate regurgitation. - Pulmonary arteries: Systolic pressure was moderately increased. PA peak pressure: 42 mm Hg (S). - Inferior vena cava: The vessel was dilated. The respirophasic diameter changes were blunted (<50%), consistent with elevated central venous pressure. - Pericardium, extracardiac: There was no pericardial  effusion.  Impressions:  - Severe LV dysfunction with LVEF 15-20% with akinesis of all of the apical and mid segments. Moderate RV systolic dysfunction.  CATH 09/20/2016   20-30% mid body stenosis of the left main. This seemed to decrease in severity as the procedure progressed suggesting catheter-induced spasm.  Dominant right coronary with 40% mid vessel stenosis and tandem 60% PDA stenoses.  Large left anterior descending which wraps around the left ventricular apex. The vessel contains a proximal to mid stent. There is diffuse 40-50% in-stent restenosis. No critical obstruction is noted.  A diagonal branch is seen to fill very late and the stump is not definable.  The circumflex gives origin to multiple obtuse marginal branches. A proximal to mid stent contains diffuse 40% restenosis. At the proximal stent margin there is eccentric 65-75% restenosis. The lesion does not account for the wall motion abnormality.   Severe left ventricular systolic dysfunction, with normal LVEDP of 13 mmHg. 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  Acute systolic heart failure versus chronic systolic heart failure will depend upon pre-existing knowledge about LV function.  RECOMMENDATIONS:   No acute intervention is recommended at this time.  Obtained prior cath and LV function data.  Continue amiodarone.  Continue IV heparin.  Start Plavix.   Echocardiogram.   Further testing and possibly percutaneous intervention on the proximal circumflex depending upon course.      Resting               Left Heart   Left Ventricle The left ventricle is dilated. There is severe left ventricular systolic dysfunction. LV end diastolic pressure is normal. The left ventricular ejection fraction is 25-35% by visual estimate. There are LV function abnormalities due to segmental dysfunction. There is no evidence of mitral regurgitation.     Coronary Diagrams   Diagnostic Diagram           Patient Profile     76 y.o. male with known CAD and previous PCI to LAD and LACx, borderline LVEF 50% in May 2018, presenting with newly diagnosed atrial fibrillation and apparent NSTEMI (troponin 4), shortly thereafter had polymorphic VT arrest requiring CPR/defib/intubation, subsequently found to have stable coronary anatomy and new extensive LV wall motion abnormalities suggestive of takotsubo sd, with relatively mild troponin increase (12).  Had transient cardiogenic shock and mild acute kidney injury and elevation in transaminases, resolved  Assessment & Plan    1. Polymorphic VT: No recurrence. Probably occurred on background of prolonged QT due to takotsubo sd and ischemia. Amio stopped due to excessive QT prolongation and 2nd deg MT1 block. QT now normal. Will ask EP for advice regarding LifeVest or ICD versus medical management. 4. CAD/NSTEMI versus Takotsubo sd.:wall motion abnormality is disproportionate to small troponin leak, therefore represents extensive stunned myocardium and is improving quickly, most consistent with takotsubo sd. No revascularization was performed. 5. AFib: present on admission and asymptomatic. CHADSVasc 5 (age 81, CHF, CAD, HTN). Converted to SR while on IV amiodarone.  Start DOAC tomorrow if no plan for ICD on this admission. Consider DCCV in 4 weeks or so. 6. 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 Now back in atrial fibrillation, rate controlled. He had been on metoprolol succinate 50 mg daily prior to this admission, without reported signs or symptoms of bradycardia. Plan to titrate back up as BP allows and EF improves.  7. HLP: excellent lipids and A1c. 8. AKI: resolved  Signed, Sanda Klein, MD  09/23/2016, 11:15 AM

## 2016-09-23 NOTE — Progress Notes (Signed)
ANTICOAGULATION CONSULT NOTE - Follow Up Consult  Pharmacy Consult for heparin Indication: cardiogenic shock/stress cardiomyopathy/non-STEMI  Labs:  Recent Labs  09/20/16 0245  09/20/16 1140 09/20/16 1727 09/21/16 0439  09/22/16 0408 09/22/16 1356 09/22/16 2348  HGB 14.4  --   --  14.6 13.9  --  11.8*  --   --   HCT 42.9  --   --  43.9 42.7  --  35.4*  --   --   PLT 193  --   --  219 203  --  156  --   --   LABPROT 15.3*  --   --   --   --   --   --   --   --   INR 1.20  --   --   --   --   --   --   --   --   HEPARINUNFRC  --   < >  --   --   --   < > 0.19* 0.23* 0.29*  CREATININE 1.09  --   --  1.31* 1.42*  --  1.18  --   --   TROPONINI 4.20*  --  6.80* 12.60*  --   --   --   --   --   < > = values in this interval not displayed.   Assessment: 76yo male remains slightly subtherapeutic on heparin after rate changes; RN notes no overt signs of bleeding.  Goal of Therapy:  Heparin level 0.3-0.7 units/ml   Plan:  Will increase heparin gtt by 2 units/kg/hr to 1500 units/hr and check level in [place], PharmD, BCPS  09/23/2016,12:41 AM

## 2016-09-24 ENCOUNTER — Encounter (HOSPITAL_COMMUNITY): Payer: Self-pay | Admitting: Physician Assistant

## 2016-09-24 DIAGNOSIS — I4901 Ventricular fibrillation: Secondary | ICD-10-CM

## 2016-09-24 LAB — CBC
HCT: 29.9 % — ABNORMAL LOW (ref 39.0–52.0)
Hemoglobin: 9.9 g/dL — ABNORMAL LOW (ref 13.0–17.0)
MCH: 31.2 pg (ref 26.0–34.0)
MCHC: 33.1 g/dL (ref 30.0–36.0)
MCV: 94.3 fL (ref 78.0–100.0)
PLATELETS: 178 10*3/uL (ref 150–400)
RBC: 3.17 MIL/uL — AB (ref 4.22–5.81)
RDW: 14.8 % (ref 11.5–15.5)
WBC: 9.8 10*3/uL (ref 4.0–10.5)

## 2016-09-24 LAB — HEPARIN LEVEL (UNFRACTIONATED): HEPARIN UNFRACTIONATED: 0.57 [IU]/mL (ref 0.30–0.70)

## 2016-09-24 MED ORDER — ISOSORBIDE MONONITRATE ER 30 MG PO TB24
30.0000 mg | ORAL_TABLET | Freq: Every day | ORAL | Status: DC
  Administered 2016-09-24 – 2016-09-26 (×3): 30 mg via ORAL
  Filled 2016-09-24 (×3): qty 1

## 2016-09-24 MED ORDER — METOPROLOL SUCCINATE ER 50 MG PO TB24
50.0000 mg | ORAL_TABLET | Freq: Every day | ORAL | Status: DC
  Administered 2016-09-24 – 2016-09-26 (×3): 50 mg via ORAL
  Filled 2016-09-24 (×3): qty 1

## 2016-09-24 NOTE — Progress Notes (Signed)
Progress Note  Patient Name: Lonnie Boyd Date of Encounter: 09/24/2016  Primary Cardiologist: Agustin Cree  Subjective   No angina or dyspnea. Walked around nursing station. Remains in atrial fibrillation with controlled rate.  EF 40-45% on follow up echo, with residual anterolateral wall motion abnormality. Antsy to go home.  Inpatient Medications    Scheduled Meds: . aspirin  81 mg Per Tube Daily  . atorvastatin  80 mg Oral q1800  . Chlorhexidine Gluconate Cloth  6 each Topical Daily  . clopidogrel  75 mg Per Tube Q breakfast  . mouth rinse  15 mL Mouth Rinse BID  . metoprolol tartrate  12.5 mg Oral BID  . pantoprazole  40 mg Oral Q1200  . sodium chloride flush  10-40 mL Intracatheter Q12H  . sodium chloride flush  3 mL Intravenous Q12H   Continuous Infusions: . sodium chloride    . heparin 1,500 Units/hr (09/23/16 0600)   PRN Meds: sodium chloride, acetaminophen, albuterol, bisacodyl, docusate, metoprolol tartrate, nitroGLYCERIN, ondansetron (ZOFRAN) IV, oxyCODONE, sodium chloride flush, sodium chloride flush, zolpidem   Vital Signs    Vitals:   09/23/16 1441 09/23/16 2107 09/24/16 0817 09/24/16 0829  BP: 109/73 (!) 100/52 (!) 143/80   Pulse: 77 (!) 107  99  Resp:  20    Temp: 97.6 F (36.4 C) 98.8 F (37.1 C)    TempSrc: Oral Oral    SpO2: 92% 94%    Weight:      Height:        Intake/Output Summary (Last 24 hours) at 09/24/16 0834 Last data filed at 09/24/16 0817  Gross per 24 hour  Intake             1788 ml  Output               75 ml  Net             1713 ml   Filed Weights   09/20/16 1014 09/22/16 0315 09/23/16 0300  Weight: 172 lb 9.9 oz (78.3 kg) 178 lb 4.8 oz (80.9 kg) 178 lb (80.7 kg)    Telemetry    atrial fibrillation, controlled;  - Personally Reviewed  ECG    No new tracing - Personally Reviewed  Physical Exam  Alert, calm, comfortable GEN: No acute distress.   Neck: No JVD Cardiac: irregular, no murmurs, rubs, or gallops.   Respiratory: Clear to auscultation bilaterally. GI: Soft, nontender, non-distended  MS: No edema; No deformity. Neuro:  Nonfocal  Psych: Normal affect   Labs    Chemistry Recent Labs Lab 09/20/16 0245 09/20/16 1727 09/21/16 0439 09/22/16 0408 09/23/16 0406  NA 139 139 139 138 137  K 3.9 4.5 4.2 3.8 3.3*  CL 107 110 111 111 108  CO2 24 21* 19* 22 24  GLUCOSE 90 146* 111* 171* 122*  BUN 14 15 20  31* 23*  CREATININE 1.09 1.31* 1.42* 1.18 0.98  CALCIUM 8.7* 7.6* 7.5* 7.5* 7.7*  PROT 6.1* 5.9*  --  4.9*  --   ALBUMIN 3.6 3.2*  --  2.4*  --   AST 72* 187*  --  103*  --   ALT 27 89*  --  53  --   ALKPHOS 77 74  --  61  --   BILITOT 1.1 1.2  --  1.4*  --   GFRNONAA >60 50* 46* 57* >60  GFRAA >60 58* 53* >60 >60  ANIONGAP 8 8 9 5  5  Hematology Recent Labs Lab 09/22/16 0408 09/23/16 0406 09/24/16 0535  WBC 12.7* 11.8* 9.8  RBC 3.71* 3.34* 3.17*  HGB 11.8* 10.4* 9.9*  HCT 35.4* 31.9* 29.9*  MCV 95.4 95.5 94.3  MCH 31.8 31.1 31.2  MCHC 33.3 32.6 33.1  RDW 15.3 15.0 14.8  PLT 156 144* 178    Cardiac Enzymes Recent Labs Lab 09/20/16 0245 09/20/16 1140 09/20/16 1727  TROPONINI 4.20* 6.80* 12.60*   No results for input(s): TROPIPOC in the last 168 hours.   BNP Recent Labs Lab 09/20/16 0245  BNP 556.1*     DDimer No results for input(s): DDIMER in the last 168 hours.   Radiology    No results found.  Cardiac Studies    Echo 09/23/2016 - Left ventricle: The cavity size was normal. There was mild   concentric hypertrophy. Systolic function was mildly to   moderately reduced. The estimated ejection fraction was in the   range of 40% to 45%. Diffuse hypokinesis. Severe hypokinesis of   the mid-apicalanterolateral, inferolateral, and apical   myocardium. Features are consistent with a pseudonormal left   ventricular filling pattern, with concomitant abnormal relaxation   and increased filling pressure (grade 2 diastolic dysfunction). - Aortic valve:  There was no regurgitation. - Mitral valve: Transvalvular velocity was within the normal range.   There was no evidence for stenosis. There was trivial   regurgitation. - Left atrium: The atrium was mildly dilated. - Right ventricle: The cavity size was normal. Wall thickness was   normal. Systolic function was normal. - Tricuspid valve: There was no regurgitation.  Impressions:  - Compared with the echo 2/69/48, systolic function has improved   Echo 09/20/2016 - Left ventricle: The cavity size was normal. There was mild concentric hypertrophy. Systolic function was severely reduced. The estimated ejection fraction was in the range of 20% to 25%. Features are consistent with a pseudonormal left ventricular filling pattern, with concomitant abnormal relaxation and increased filling pressure (grade 2 diastolic dysfunction). Doppler parameters are consistent with elevated ventricular end-diastolic filling pressure. - Mitral valve: There was mild regurgitation. - Right ventricle: The cavity size was moderately dilated. Wall thickness was normal. Systolic function was severely reduced. - Right atrium: The atrium was normal in size. - Tricuspid valve: There was moderate regurgitation. - Pulmonary arteries: Systolic pressure was moderately increased. PA peak pressure: 42 mm Hg (S). - Inferior vena cava: The vessel was dilated. The respirophasic diameter changes were blunted (<50%), consistent with elevated central venous pressure. - Pericardium, extracardiac: There was no pericardial effusion.  Impressions:  - Severe LV dysfunction with LVEF 15-20% with akinesis of all of the apical and mid segments. Moderate RV systolic dysfunction.  CATH 09/20/2016   20-30% mid body stenosis of the left main. This seemed to decrease in severity as the procedure progressed suggesting catheter-induced spasm.  Dominant right coronary with 40% mid vessel stenosis and  tandem 60% PDA stenoses.  Large left anterior descending which wraps around the left ventricular apex. The vessel contains a proximal to mid stent. There is diffuse 40-50% in-stent restenosis. No critical obstruction is noted. A diagonal branch is seen to fill very late and the stump is not definable.  The circumflex gives origin to multiple obtuse marginal branches. A proximal to mid stent contains diffuse 40% restenosis. At the proximal stent margin there is eccentric 65-75% restenosis. The lesion does not account for the wall motion abnormality.   Severe left ventricular systolic dysfunction, with normal LVEDP of 13 mmHg.  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  Acute systolic heart failure versus chronic systolic heart failure will depend upon pre-existing knowledge about LV function.  RECOMMENDATIONS:   No acute intervention is recommended at this time.  Obtained prior cath and LV function data.  Continue amiodarone.  Continue IV heparin.  Start Plavix.   Echocardiogram.   Further testing and possibly percutaneous intervention on the proximal circumflex depending upon course.      Resting               Left Heart   Left Ventricle The left ventricle is dilated. There is severe left ventricular systolic dysfunction. LV end diastolic pressure is normal. The left ventricular ejection fraction is 25-35% by visual estimate. There are LV function abnormalities due to segmental dysfunction. There is no evidence of mitral regurgitation.    Coronary Diagrams   Diagnostic Diagram           Patient Profile    Patient Profile     76 y.o. male known CAD and previous PCI to LAD and LACx, borderline LVEF 50% in May 2018, presenting with newly diagnosed atrial fibrillation and apparent NSTEMI (troponin 4), shortly thereafter had polymorphic VT arrest requiring CPR/defib/intubation, subsequently found to  have stable coronary anatomy and new extensive LV wall motion abnormalities suggestive of takotsubo sd, with relatively mild troponin increase (12).  Had transient cardiogenic shock and mild acute kidney injury and elevation in transaminases, resolved. Follow-up echo 3 days later shows EF 45%  Assessment & Plan    1. Polymorphic VT: No recurrence. Probably occurred on background of prolonged QT due to takotsubo sd and ischemia (amiodarone given, then stopped due to excessive QT prolongation and2nd deg MT1 block. QT now normal). EP consult for advice regarding LifeVest or ICD versus medical management. 2. CAD/NSTEMI versus Takotsubo sd.:wall motion abnormality is disproportionate to small troponin leak, therefore represents extensive stunned myocardium and has improved quickly, most consistent with takotsubo sd. No revascularization was performed. Has residual anterolateral hypokinesis that could be related to known occluded diagonal and jailed OM branch. 3. AFib: present on admission and asymptomatic. CHADSVasc 5 (age 49, CHF, CAD, HTN). Converted to SR while on IV amiodarone.  Start DOAC tomorrow if no plan for ICD on this admission. Consider DCCV in 4 weeks or so. 4. 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 Now back in atrial fibrillation, rate controlled. Titrate back up metoprolol succinate to 50 mg daily as prior to this admission. 5. HLP: excellent lipids and A1c. 6. AKI: resolved. Replace K and PO4.  Signed, Sanda Klein, MD  09/24/2016, 8:34 AM

## 2016-09-24 NOTE — Progress Notes (Signed)
CARDIAC REHAB PHASE I   Returned to complete MI education with pt and son at bedside. Reviewed risk factors, MI book, takotsubo cardiomyopathy, atrial fibrillation, life vest, anti-platelet therapy, activity restrictions, ntg, exercise, heart healthy diet, chf booklet and zone tool, daily weights, and phase 2 cardiac rehab. Pt and son verbalized understanding, receptive to education. Pt agrees to phase 2 cardiac rehab referral, will send to [place] per pt request. Pt in bed, call bell within reach. Will follow.     [place], RN, BSN 09/24/2016 2:09 PM

## 2016-09-24 NOTE — Progress Notes (Signed)
ANTICOAGULATION CONSULT NOTE - Follow Up Consult  Pharmacy Consult for Heparin Indication: MI  No Known Allergies  Patient Measurements: Height: 5\' 8"  (172.7 cm) Weight: 178 lb (80.7 kg) IBW/kg (Calculated) : 68.4 Heparin Dosing Weight:    Vital Signs: Temp: 98.8 F (37.1 C) (08/30 2107) Temp Source: Oral (08/30 2107) BP: 143/80 (08/31 0817) Pulse Rate: 99 (08/31 0829)  Labs:  Recent Labs  09/22/16 0408  09/22/16 2348 09/23/16 0406 09/23/16 0918 09/24/16 0535  HGB 11.8*  --   --  10.4*  --  9.9*  HCT 35.4*  --   --  31.9*  --  29.9*  PLT 156  --   --  144*  --  178  HEPARINUNFRC 0.19*  < > 0.29*  --  0.47 0.57  CREATININE 1.18  --   --  0.98  --   --   < > = values in this interval not displayed.  Estimated Creatinine Clearance: 60.1 mL/min (by C-G formula based on SCr of 0.98 mg/dL).   Assessment: Anticoag: new afib. CHADSVASC=5 - IV hep for now. HL 0.57 in goal range. -CBC trending down hgb 14>>9.9 over the past few days, plts 178 up   Goal of Therapy:  Heparin level 0.3-0.7 units/ml Monitor platelets by anticoagulation protocol: Yes   Plan:  -Heparin to 1500/hr -Check daily HL, CBC -Start DOAC tomorrow if no plan for ICD on this admission. Consider DCCV in 4 weeks or so.  Cree Napoli S. Alford Highland, PharmD, BCPS Clinical Staff Pharmacist Pager [phone]  Eilene Ghazi Stillinger 09/24/2016,9:06 AM

## 2016-09-24 NOTE — Progress Notes (Signed)
CARDIAC REHAB PHASE I   PRE:  Rate/Rhythm: 73 a fib  BP:  Sitting: 102/58        SaO2: 95 RA  MODE:  Ambulation: 170 ft   POST:  Rate/Rhythm: 93 a fib  BP:  Sitting: 116/74         SaO2: 97 RA  Pt ambulated 170 ft on RA, rolling walker, IV, gait belt, assist x2, slow, mostly steady gait, tolerated fairly well. Pt c/o some DOE, fatigue with distance, generalized weakness. Pt to chair after walk, call bell within reach. Spoke with pt's son who is concerned pt may be discharged today and is not yet ready, RN notified. Will follow up this afternoon for possible education.   [place], RN, BSN 09/24/2016 12:07 PM

## 2016-09-24 NOTE — Care Management Note (Addendum)
Case Management Note  Patient Details  Name: Lonnie Boyd MRN: [id number] Date of Birth: 08-19-1940  Subjective/Objective: Pt presented for Nstemi- Continues on IV Heparin gtt. QHU:[id number] Lora Havens MD. Pt is from home with wife and he is the caregiver for her. Wife is currently in the hospital and will be placed in a SNF. Son Lonnie Boyd is telling the husband this information today. Per son pt will return home with the support of family and church friends. Plan will be for home with life vest- CM did reach out to  Janeann Forehand with [employer] and vest should be approved and hopefully delivered today. Pt will have [facility]-Agency list provided and family chose [facility]. Personal Care Provider List was offered to patient and son felt like they could not afford out of pocket cost.                    Action/Plan: CM did make referral to [facility] to begin within 24-48 hours post d/c. Agency is aware that pt has life vest. Agency has not been trained. Family will be available for assistance. Family to provide transport home. No further needs from CM at this time.   Expected Discharge Date:                  Expected Discharge Plan:  [place]  In-House Referral:  NA  Discharge planning Services  CM Consult  Post Acute Care Choice:  Home Health Choice offered to:  Patient, Adult Children  DME Arranged:  Walker rolling DME Agency:  [facility] Arranged:  RN, PT, Nurse's Aide, Social Work [facility] Agency:  [place]  Status of Service:  Completed, signed off  If discussed at [employer] of Stay Meetings, dates discussed:    Additional Comments:  Bethena Roys, RN 09/24/2016, 12:16 PM

## 2016-09-24 NOTE — Care Management Important Message (Signed)
Important Message  Patient Details  Name: Lonnie Boyd MRN: [id number] Date of Birth: 23-Sep-1940   Medicare Important Message Given:  Yes    Nathen May 09/24/2016, 9:16 AM

## 2016-09-24 NOTE — Consult Note (Signed)
Cardiology Consultation:   Patient ID: Lonnie Boyd; [id number]; [birth year]   Admit date: 09/20/2016 Date of Consult: 09/24/2016  Primary Care Provider: No primary care provider on file. Primary Cardiologist: Dr. Agustin Cree   Patient Profile:   Lonnie Boyd is a 76 y.o. male with a hx of CAD with prior PCI in 2011  who is being seen today for the evaluation of VF arrest at the request of Dr. Sallyanne Kuster.  History of Present Illness:   Lonnie Boyd was admitted to [facility] 09/20/16 with c/o CP< dx with NSTEMI and new onset AFib, started in heparin/ASA, statin, BB/ARB and planned for LHC, prior to cath had PMVT/VF arrest emergent cath without intervention.  In review of record, Dr. Victorino December thoughts are perhaps stress related takotsubo rather then NSTEMI.   EF initially 20-25%  A limited echo yesterday with improvement to 40-45%  The patient has been subsequently extubated, off pressor support recovering well, and now pending discharge.  EP service is asked to weigh in on ICD vs live vest.  Past Medical History:  Diagnosis Date  . CAD (coronary artery disease)   . Cardiomyopathy ([place])   . Hyperlipemia   . Ventral hernia     Past Surgical History:  Procedure Laterality Date  . KIDNEY SURGERY    . LEFT HEART CATH AND CORONARY ANGIOGRAPHY N/A 09/20/2016   Procedure: LEFT HEART CATH AND CORONARY ANGIOGRAPHY;  Surgeon: Belva Crome, MD;  Location: [place] [facility];  Service: Cardiovascular;  Laterality: N/A;     Home Medications:  Prior to Admission medications   Medication Sig Start Date End Date Taking? Authorizing Provider  albuterol (PROVENTIL) (2.5 MG/3ML) 0.083% nebulizer solution USE 1 VIAL VIA NEBULIZER 4 TIMES DAILY as needed for shortness of breath 08/18/16  Yes [provider]  aspirin EC 81 MG tablet Take 81 mg by mouth daily.   Yes [provider]  atorvastatin (LIPITOR) 40 MG tablet Take 40 mg by mouth at bedtime. 08/13/16  Yes [provider]   esomeprazole (NEXIUM) 20 MG capsule Take 20 mg by mouth daily at 12 noon.   Yes [provider]  losartan (COZAAR) 50 MG tablet Take 50 mg by mouth daily. 06/23/16 06/23/17 Yes [provider]  metoprolol succinate (TOPROL-XL) 50 MG 24 hr tablet Take 1 tablet (50 mg total) by mouth daily. 08/16/16  Yes Park Liter, MD  tamsulosin (FLOMAX) 0.4 MG CAPS capsule Take 0.4 mg by mouth every other day. 08/13/16  Yes [provider]  zolpidem (AMBIEN) 5 MG tablet Take 5 mg by mouth at bedtime as needed for sleep. 08/19/16  Yes [provider]    Inpatient Medications: Scheduled Meds: . aspirin  81 mg Per Tube Daily  . atorvastatin  80 mg Oral q1800  . Chlorhexidine Gluconate Cloth  6 each Topical Daily  . clopidogrel  75 mg Per Tube Q breakfast  . isosorbide mononitrate  30 mg Oral Daily  . mouth rinse  15 mL Mouth Rinse BID  . metoprolol succinate  50 mg Oral Daily  . pantoprazole  40 mg Oral Q1200  . sodium chloride flush  10-40 mL Intracatheter Q12H  . sodium chloride flush  3 mL Intravenous Q12H   Continuous Infusions: . sodium chloride    . heparin 1,500 Units/hr (09/24/16 0937)   PRN Meds: sodium chloride, acetaminophen, albuterol, bisacodyl, docusate, metoprolol tartrate, nitroGLYCERIN, ondansetron (ZOFRAN) IV, oxyCODONE, sodium chloride flush, sodium chloride flush, zolpidem  Allergies:   No  Known Allergies  Social History:   Social History   Social History  . Marital status: Married    Spouse name: N/A  . Number of children: N/A  . Years of education: N/A   Occupational History  . Not on file.   Social History Main Topics  . Smoking status: Never Smoker  . Smokeless tobacco: Never Used  . Alcohol use No  . Drug use: No  . Sexual activity: Not on file   Other Topics Concern  . Not on file   Social History Narrative  . No narrative on file    Family History:   Family History  Problem Relation Age of Onset  . Cancer  Father   . Hypertension Father   . Leukemia Sister      ROS:  Please see the history of present illness.  ROS  All other ROS reviewed and negative.     Physical Exam/Data:   Vitals:   09/23/16 1441 09/23/16 2107 09/24/16 0817 09/24/16 0829  BP: 109/73 (!) 100/52 (!) 143/80   Pulse: 77 (!) 107  99  Resp:  20    Temp: 97.6 F (36.4 C) 98.8 F (37.1 C)    TempSrc: Oral Oral    SpO2: 92% 94%    Weight:      Height:        Intake/Output Summary (Last 24 hours) at 09/24/16 1255 Last data filed at 09/24/16 1128  Gross per 24 hour  Intake             1573 ml  Output              125 ml  Net             1448 ml   Filed Weights   09/20/16 1014 09/22/16 0315 09/23/16 0300  Weight: 172 lb 9.9 oz (78.3 kg) 178 lb 4.8 oz (80.9 kg) 178 lb (80.7 kg)   Body mass index is 27.06 kg/m.  General:  Well nourished, well developed, in no acute distress HEENT: normal Lymph: no adenopathy Neck: no JVD Endocrine:  No thryomegaly Vascular: No carotid bruits; FA pulses 2+ bilaterally without bruits  Cardiac:  IRRR; 1/6 SM Lungs:  CTA b/l, no wheezing, rhonchi or rales  Abd: soft, nontender, no hepatomegaly  Ext: no edema Musculoskeletal:  No deformities, BUE and BLE strength normal and equal Skin: warm and dry  Neuro:  CNs 2-12 intact, no focal abnormalities noted Psych:  Normal affect   EKG:  The EKG was personally reviewed and demonstrates:   Most recent is Atrial fib/Flutter 70bpm,low voltage Telemetry:  Telemetry was personally reviewed and demonstrates:   AFib/flutter CVR, no V arrhythmias in last 48hours reviewed  Relevant CV Studies: Echo 09/23/2016 - Left ventricle: The cavity size was normal. There was mild concentric hypertrophy. Systolic function was mildly to moderately reduced. The estimated ejection fraction was in the range of 40% to 45%. Diffuse hypokinesis. Severe hypokinesis of the mid-apicalanterolateral, inferolateral, and apical myocardium. Features  are consistent with a pseudonormal left ventricular filling pattern, with concomitant abnormal relaxation and increased filling pressure (grade 2 diastolic dysfunction). - Aortic valve: There was no regurgitation. - Mitral valve: Transvalvular velocity was within the normal range. There was no evidence for stenosis. There was trivial regurgitation. - Left atrium: The atrium was mildly dilated. - Right ventricle: The cavity size was normal. Wall thickness was normal. Systolic function was normal. - Tricuspid valve: There was no regurgitation. Impressions: - Compared with the  echo 0/73/71, systolic function has improved   Echo 09/20/2016 - Left ventricle: The cavity size was normal. There was mild concentric hypertrophy. Systolic function was severely reduced. The estimated ejection fraction was in the range of 20% to 25%. Features are consistent with a pseudonormal left ventricular filling pattern, with concomitant abnormal relaxation and increased filling pressure (grade 2 diastolic dysfunction). Doppler parameters are consistent with elevated ventricular end-diastolic filling pressure. - Mitral valve: There was mild regurgitation. - Right ventricle: The cavity size was moderately dilated. Wall thickness was normal. Systolic function was severely reduced. - Right atrium: The atrium was normal in size. - Tricuspid valve: There was moderate regurgitation. - Pulmonary arteries: Systolic pressure was moderately increased. PA peak pressure: 42 mm Hg (S). - Inferior vena cava: The vessel was dilated. The respirophasic diameter changes were blunted (<50%), consistent with elevated central venous pressure. - Pericardium, extracardiac: There was no pericardial effusion. Impressions: - Severe LV dysfunction with LVEF 15-20% with akinesis of all of the apical and mid segments. Moderate RV systolic dysfunction.   CATH 09/20/2016   20-30% mid body  stenosis of the left main. This seemed to decrease in severity as the procedure progressed suggesting catheter-induced spasm.  Dominant right coronary with 40% mid vessel stenosis and tandem 60% PDA stenoses.  Large left anterior descending which wraps around the left ventricular apex. The vessel contains a proximal to mid stent. There is diffuse 40-50% in-stent restenosis. No critical obstruction is noted. A diagonal branch is seen to fill very late and the stump is not definable.  The circumflex gives origin to multiple obtuse marginal branches. A proximal to mid stent contains diffuse 40% restenosis. At the proximal stent margin there is eccentric 65-75% restenosis. The lesion does not account for the wall motion abnormality.   Severe left ventricular systolic dysfunction, with normal LVEDP of 13 mmHg. 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  Acute systolic heart failure versus chronic systolic heart failure will depend upon pre-existing knowledge about LV function.  RECOMMENDATIONS:   No acute intervention is recommended at this time.  Obtained prior cath and LV function data.  Continue amiodarone.  Continue IV heparin.  Start Plavix.   Echocardiogram.   Further testing and possibly percutaneous intervention on the proximal circumflex depending upon course.  Laboratory Data:  Chemistry  Recent Labs Lab 09/21/16 0439 09/22/16 0408 09/23/16 0406  NA 139 138 137  K 4.2 3.8 3.3*  CL 111 111 108  CO2 19* 22 24  GLUCOSE 111* 171* 122*  BUN 20 31* 23*  CREATININE 1.42* 1.18 0.98  CALCIUM 7.5* 7.5* 7.7*  GFRNONAA 46* 57* >60  GFRAA 53* >60 >60  ANIONGAP 9 5 5      Recent Labs Lab 09/20/16 0245 09/20/16 1727 09/22/16 0408  PROT 6.1* 5.9* 4.9*  ALBUMIN 3.6 3.2* 2.4*  AST 72* 187* 103*  ALT 27 89* 53  ALKPHOS 77 74 61  BILITOT 1.1 1.2 1.4*   Hematology  Recent Labs Lab 09/22/16 0408 09/23/16 0406  09/24/16 0535  WBC 12.7* 11.8* 9.8  RBC 3.71* 3.34* 3.17*  HGB 11.8* 10.4* 9.9*  HCT 35.4* 31.9* 29.9*  MCV 95.4 95.5 94.3  MCH 31.8 31.1 31.2  MCHC 33.3 32.6 33.1  RDW 15.3 15.0 14.8  PLT 156 144* 178   Cardiac Enzymes  Recent Labs Lab 09/20/16 0245 09/20/16 1140 09/20/16 1727  TROPONINI 4.20* 6.80* 12.60*   No results for input(s): TROPIPOC in the last 168 hours.  BNP  Recent Labs Lab 09/20/16 0245  BNP 556.1*     Radiology/Studies:  Dg Chest Port 1 View Result Date: 09/22/2016 CLINICAL DATA:  Respiratory failure EXAM: PORTABLE CHEST 1 VIEW COMPARISON:  09/21/2016 FINDINGS: Interval placement of NG tube which is in a large hiatal hernia. Remainder the support devices are unchanged. Bibasilar atelectasis. No definite effusions. No acute bony abnormality. IMPRESSION: NG tube in a large hiatal hernia. Bibasilar atelectasis. Electronically Signed   By: Rolm Baptise M.D.   On: 09/22/2016 07:27     Assessment and Plan:   1. S/p PMVT/VF arrest     In review and discussion with Dr. Sallyanne Kuster, suspects  stress related takotsubo rather then NSTEMI/ischemic event     There has been interval improvement in LVEF as noted     At this point recommend Life vest for this patient with close out patient follow up  2. New AFlutter     CHA2Ds2Vasc is at least 5, on heparin with plans to [facility] prior to discharge  3. CAD 4. HLD 5. AKI   I will arrange out patient EP follow up Life Vest form/order has been sent to [facility].  EP remains available, please call if needed.  Signed, Baldwin Jamaica, PA-C  09/24/2016 12:55 PM  EP Attending  Patient seen and examined. Agree with above. The patient has had a complex combination of problems. His clinical history is consistent with acute coronary ischemia, NSTEMI, and VF arrest. His EF was down and is much improved. He has underlying CAD. I wonder about whether this could be coronary spasm rather than a Takasubo. Until things become more clear,  I would suggest that he wear a life vest, start systemic anti-coagulation for his aftrial fib/flutter treat with medical therapy and repeat his echo in 2 months.   Cristopher Peru, M.D.

## 2016-09-25 ENCOUNTER — Inpatient Hospital Stay (HOSPITAL_COMMUNITY): Payer: Medicare Other

## 2016-09-25 DIAGNOSIS — N179 Acute kidney failure, unspecified: Secondary | ICD-10-CM | POA: Diagnosis not present

## 2016-09-25 DIAGNOSIS — Z7901 Long term (current) use of anticoagulants: Secondary | ICD-10-CM

## 2016-09-25 DIAGNOSIS — I480 Paroxysmal atrial fibrillation: Secondary | ICD-10-CM | POA: Diagnosis not present

## 2016-09-25 DIAGNOSIS — R570 Cardiogenic shock: Secondary | ICD-10-CM | POA: Diagnosis not present

## 2016-09-25 DIAGNOSIS — R0603 Acute respiratory distress: Secondary | ICD-10-CM

## 2016-09-25 DIAGNOSIS — D649 Anemia, unspecified: Secondary | ICD-10-CM

## 2016-09-25 DIAGNOSIS — K7200 Acute and subacute hepatic failure without coma: Secondary | ICD-10-CM | POA: Diagnosis not present

## 2016-09-25 HISTORY — DX: Paroxysmal atrial fibrillation: I48.0

## 2016-09-25 LAB — URINALYSIS, ROUTINE W REFLEX MICROSCOPIC
Bilirubin Urine: NEGATIVE
GLUCOSE, UA: NEGATIVE mg/dL
Ketones, ur: NEGATIVE mg/dL
LEUKOCYTES UA: NEGATIVE
NITRITE: NEGATIVE
PROTEIN: NEGATIVE mg/dL
SPECIFIC GRAVITY, URINE: 1.015 (ref 1.005–1.030)
pH: 5 (ref 5.0–8.0)

## 2016-09-25 LAB — CBC
HEMATOCRIT: 26.3 % — AB (ref 39.0–52.0)
HEMOGLOBIN: 8.8 g/dL — AB (ref 13.0–17.0)
MCH: 31.1 pg (ref 26.0–34.0)
MCHC: 33.5 g/dL (ref 30.0–36.0)
MCV: 92.9 fL (ref 78.0–100.0)
Platelets: 199 10*3/uL (ref 150–400)
RBC: 2.83 MIL/uL — ABNORMAL LOW (ref 4.22–5.81)
RDW: 14.7 % (ref 11.5–15.5)
WBC: 8.6 10*3/uL (ref 4.0–10.5)

## 2016-09-25 LAB — HEPARIN LEVEL (UNFRACTIONATED): Heparin Unfractionated: 0.53 IU/mL (ref 0.30–0.70)

## 2016-09-25 LAB — OCCULT BLOOD X 1 CARD TO LAB, STOOL: Fecal Occult Bld: POSITIVE — AB

## 2016-09-25 MED ORDER — APIXABAN 5 MG PO TABS: 5.0000 mg | ORAL_TABLET | Freq: Two times a day (BID) | ORAL | 11 refills | Status: DC

## 2016-09-25 MED ORDER — APIXABAN 5 MG PO TABS: 5.0000 mg | ORAL_TABLET | Freq: Two times a day (BID) | ORAL | Status: DC

## 2016-09-25 MED ORDER — PANTOPRAZOLE SODIUM 40 MG PO TBEC
40.0000 mg | DELAYED_RELEASE_TABLET | Freq: Every day | ORAL | 11 refills | Status: DC
Start: 2016-09-26 — End: 2016-09-26

## 2016-09-25 MED ORDER — NITROGLYCERIN 0.4 MG SL SUBL: 0.4000 mg | SUBLINGUAL_TABLET | SUBLINGUAL | 3 refills | Status: DC | PRN

## 2016-09-25 MED ORDER — ATORVASTATIN CALCIUM 80 MG PO TABS: 80.0000 mg | ORAL_TABLET | Freq: Every day | ORAL | 3 refills | Status: DC

## 2016-09-25 MED ORDER — ISOSORBIDE MONONITRATE ER 30 MG PO TB24: 30.0000 mg | ORAL_TABLET | Freq: Every day | ORAL | 3 refills | Status: DC

## 2016-09-25 MED ORDER — ACETAMINOPHEN 325 MG PO TABS: 650.0000 mg | ORAL_TABLET | ORAL | Status: DC | PRN

## 2016-09-25 MED ORDER — CLOPIDOGREL BISULFATE 75 MG PO TABS: 75.0000 mg | ORAL_TABLET | Freq: Every day | ORAL | 3 refills | Status: DC

## 2016-09-25 NOTE — Discharge Summary (Signed)
Discharge Summary    Patient ID: Lonnie Boyd,  MRN: [id number], DOB/AGE: 1940/09/15 76 y.o.  Admit date: 09/20/2016 Discharge date: 09/26/2016  Primary Care Provider: No primary care provider on file. Primary Cardiologist: Dr Agustin Cree EP- Dr Lovena Le  Discharge Diagnoses    Principal Problem:   NSTEMI (non-ST elevated myocardial infarction) [facility]) Active Problems:   Ischemic cardiomyopathy   CAD- prior PCIs- cath- moderate CAD 09/22/16   Torsades de pointes [facility])   Cardiac arrest with ventricular fibrillation ([place])   PAF (paroxysmal atrial fibrillation) ([place])   Anticoagulated-Eliquis   Cardiogenic shock ([place])   AKI (acute kidney injury) ([place])   Shock liver   Acute respiratory distress   Anemia-HEME positive   Hematoma of left chest wall   Multiple closed fractures of ribs of both sides   Allergies No Known Allergies  Diagnostic Studies/Procedures    Cath 09/20/16 Echo 09/20/16 Echo 09/23/16 Chest CTA 09/25/16 _____________   History of Present Illness     76 y/o with known CAD, admitted with NSTEMI 09/20/16 and had VT cardiac arrest.  Hospital Course     Consultants: CCM 09/20/16 Dr Lovena Le   76 y/o male with a history of CAD, s/p LAD and CFX PCI in 2011, admitted 09/20/16-2am with SSCP and elevated Troponin c/w NSTEMI. The pt was scheduled to have a cath 09/20/16 when he had polymorphic VT that deteriorated to VF and cardiac arrest. CPR was given for 10 mins, defibrillated once, epinephrine twice, and amiodarone 300 mg bolus given with ROSC less than 15 minutes to slow atrial fib rhythm.  Patient was moaning/shouting and making some purposeful movement afterwards before becoming agonal requiring intubation by anesthesia for airway protection.  Patient taken emergently to the cath lab by Cardiology. Cath revealed moderate CAD but no obvious culprit lesion. Echo 09/20/16 showed severe LV dysfunction with LVEF 15-20% with akinesis of all of   the apical and mid segments.  There was concern he may have had a Takotsubo event. He was transferred to ICU. His SCr bumped to 1.42, SGOT/SGOT to 187/89. These normalized within 3 days. He initialy had hypoxic encephalopathy but this cleared also. He did have prolonged QTc and transient 2nd degree AVB- Amiodarone was discontinued and this stabilized. The pt had a f/u echo 09/24/16 which showed improved LVF-EF 40-45%. He was seen in consult by Dr Lovena Le 09/24/16 who felt this may have been coronary spasm rather than Takotsubo. He recommended Life Vest at discharge and f/u echo in 2 months. He remained in AF and anticoagulation was recommended but Dr Debara Pickett felt we should not give ASA secondary to bleeding risk and the pt had significant ecchymosis across the torso. He did have a CTA of his chest before discharge that delayed him going home one day. He was also noted to have heme positive anemia. Dr Debara Pickett discussed this with Dr Paulita Fujita  (GI) but they felt he was not a candidate for further work up at this time and suggested increasing his PPI to BID. Life vest was arranged and the pt is discharged 09/26/16. He'll be seen in the office in 7-14 days. F/U in Oct with Dr Lovena Le has been arranged.   _____________  Discharge Vitals Blood pressure 110/66, pulse 71, temperature 98 F (36.7 C), temperature source Oral, resp. rate 17, height 5\' 8"  (1.727 m), weight 183 lb 3.2 oz (83.1 kg), SpO2 96 %.  Filed Weights   09/23/16 0300 09/25/16 0523 09/26/16 0446  Weight: 178 lb (80.7 kg)  182 lb 1.6 oz (82.6 kg) 183 lb 3.2 oz (83.1 kg)    Labs & Radiologic Studies    CBC  Recent Labs  09/25/16 0501 09/26/16 0505  WBC 8.6 9.0  HGB 8.8* 9.1*  HCT 26.3* 27.2*  MCV 92.9 93.5  PLT 199 607   Basic Metabolic Panel No results for input(s): NA, K, CL, CO2, GLUCOSE, BUN, CREATININE, CALCIUM, MG, PHOS in the last 72 hours. Liver Function Tests No results for input(s): AST, ALT, ALKPHOS, BILITOT, PROT, ALBUMIN in the last 72 hours. No results for  input(s): LIPASE, AMYLASE in the last 72 hours. Cardiac Enzymes No results for input(s): CKTOTAL, CKMB, CKMBINDEX, TROPONINI in the last 72 hours. BNP Invalid input(s): POCBNP D-Dimer No results for input(s): DDIMER in the last 72 hours. Hemoglobin A1C No results for input(s): HGBA1C in the last 72 hours. Fasting Lipid Panel No results for input(s): CHOL, HDL, LDLCALC, TRIG, CHOLHDL, LDLDIRECT in the last 72 hours. Thyroid Function Tests No results for input(s): TSH, T4TOTAL, T3FREE, THYROIDAB in the last 72 hours.  Invalid input(s): FREET3 _____________  X-ray Chest Pa And Lateral  Result Date: 09/20/2016 CLINICAL DATA:  Chest discomfort. EXAM: CHEST  2 VIEW COMPARISON:  09/19/2016 FINDINGS: Cardiomegaly. There are bibasilar airspace opacities which are stable since prior study and dating back to 2017, likely scarring. No definite acute airspace opacity. No effusion. No acute bony abnormality. IMPRESSION: Stable bibasilar densities, likely bibasilar scarring. Mild cardiomegaly. Electronically Signed   By: Rolm Baptise M.D.   On: 09/20/2016 10:02   Ct Chest Wo Contrast  Result Date: 09/25/2016 CLINICAL DATA:  Inpatient. Status post CPR. Clinical concern for chest injury. EXAM: CT CHEST WITHOUT CONTRAST TECHNIQUE: Multidetector CT imaging of the chest was performed following the standard protocol without IV contrast. COMPARISON:  09/22/2016 chest radiograph. FINDINGS: Cardiovascular: Cardiomegaly . No significant pericardial fluid/thickening. Left anterior descending, left circumflex and right coronary atherosclerosis. Right subclavian central venous catheter terminates at the cavoatrial junction. Atherosclerotic nonaneurysmal thoracic aorta. Normal caliber pulmonary arteries. Mediastinum/Nodes: No discrete thyroid nodules. Unremarkable esophagus. No pathologically enlarged axillary, mediastinal or gross hilar lymph nodes, noting limited sensitivity for the detection of hilar adenopathy on this  noncontrast study. Coarse calcified granulomatous right hilar nodes. Lungs/Pleura: No pneumothorax. Small dependent bilateral pleural effusions. Moderate compressive atelectasis in the dependent right lower lobe. Mild compressive atelectasis in the dependent left lower lobe and left upper lobe. Mild patchy ground-glass attenuation and mild interlobular septal thickening in both lungs. Otherwise no acute consolidative airspace disease, lung masses or significant pulmonary nodules in the aerated portions of the lungs. Upper abdomen: Multiple simple liver cysts scattered throughout the liver, largest 4.8 cm in the anterior left liver lobe. Large hiatal hernia, increased. Colonic diverticulosis. Musculoskeletal: No aggressive appearing focal osseous lesions. Minimally displaced acute anterior left fourth, fifth and sixth rib fractures with overlying ventral left chest wall pectoral muscle and subcutaneous contusion. Moderate thoracic spondylosis. IMPRESSION: 1. Minimally displaced acute anterior left fourth, fifth and sixth rib fractures with overlying ventral left chest wall pectoral muscle and subcutaneous contusion. No pneumothorax. 2. Small dependent bilateral pleural effusions . 3. Cardiomegaly. Mild patchy ground-glass attenuation and mild interlobular septal thickening in both lungs, suggesting mild pulmonary edema. 4. Three-vessel coronary atherosclerosis . 5. Large hiatal hernia. 6. Mild to moderate bibasilar atelectasis . Electronically Signed   By: Ilona Sorrel M.D.   On: 09/25/2016 17:53   Dg Chest Port 1 View  Result Date: 09/22/2016 CLINICAL DATA:  Respiratory failure EXAM: PORTABLE  CHEST 1 VIEW COMPARISON:  09/21/2016 FINDINGS: Interval placement of NG tube which is in a large hiatal hernia. Remainder the support devices are unchanged. Bibasilar atelectasis. No definite effusions. No acute bony abnormality. IMPRESSION: NG tube in a large hiatal hernia. Bibasilar atelectasis. Electronically Signed    By: Rolm Baptise M.D.   On: 09/22/2016 07:27   Portable Chest Xray  Result Date: 09/21/2016 CLINICAL DATA:  Intubated patient, respiratory failure, cardiomyopathy, NSTEMI, cardiac arrest. EXAM: PORTABLE CHEST 1 VIEW COMPARISON:  Portable chest x-ray of September 20, 2016 FINDINGS: The right lung is reasonably well inflated. There is increased density at the lung bases not significantly changed from yesterday's study. There is a small left pleural effusion and trace right pleural effusion. The heart and pulmonary vascularity are normal. External pacemaker defibrillator pads are present. The endotracheal tube tip lies approximately 3.3 cm above the carina. The right subclavian venous catheter tip projects over the midportion of the SVC. The bony thorax exhibits no acute abnormality. IMPRESSION: Persistent bibasilar densities which may reflect atelectasis or pneumonia. Small left and trace right pleural effusions. The support tubes are in reasonable position. There is no overt CHF. Electronically Signed   By: David  Martinique M.D.   On: 09/21/2016 07:46   Dg Chest Port 1 View  Result Date: 09/20/2016 CLINICAL DATA:  Central line placement EXAM: PORTABLE CHEST 1 VIEW COMPARISON:  Portable exam 1652 hours compared to 09/20/2016 FINDINGS: Tip of endotracheal tube projects 3.7 cm above carina. RIGHT subclavian central venous catheter newly identified with tip projecting over SVC near cavoatrial junction. External pacing leads and EKG leads project over chest. Enlargement of cardiac silhouette. Mediastinal contours and pulmonary vascularity normal. Large bleb at the medial RIGHT lung base again identified. Bibasilar atelectasis versus infiltrate increased on LEFT. No gross pleural effusion or pneumothorax. IMPRESSION: No pneumothorax following RIGHT subclavian line placement. BILATERAL lower lobe opacities question atelectasis versus infiltrate increased on LEFT since previous exam. Electronically Signed   By: Lavonia Dana  M.D.   On: 09/20/2016 17:15   Dg Chest Port 1 View  Result Date: 09/20/2016 CLINICAL DATA:  Orogastric tube placement and endotracheal tube placement. EXAM: PORTABLE CHEST 1 VIEW COMPARISON:  Radiograph of same day. FINDINGS: Stable cardiomediastinal silhouette. Endotracheal tube is seen projected over tracheal air shadow with distal tip 3 cm above the carina. Interval placement of orogastric tube with tip coiled in the large hiatal hernia above the right hemidiaphragm. No pneumothorax or significant pleural effusion is noted. Stable bibasilar atelectasis or edema is noted. Bony thorax is unremarkable. IMPRESSION: Endotracheal tube in grossly good position. Distal tip of orogastric tube is seen in large hiatal hernia above right hemidiaphragm. Stable bibasilar atelectasis or edema. Electronically Signed   By: Marijo Conception, M.D.   On: 09/20/2016 14:01   Dg Abd Portable 1v  Result Date: 09/21/2016 CLINICAL DATA:  NG tube placement. EXAM: PORTABLE ABDOMEN - 1 VIEW COMPARISON:  CT scan dated 08/17/2003 FINDINGS: There is an NG tube with the tip in a large hiatal hernia above the diaphragm. The entire fundus of the stomach appears to be herniated. There is a single slightly prominent small bowel loop in the left mid abdomen. Bowel gas pattern is otherwise normal. Rotoscoliosis of the lumbar spine. IMPRESSION: NG tube tip in the fundus of the stomach, in a large hiatal hernia. Electronically Signed   By: Lorriane Shire M.D.   On: 09/21/2016 11:30   Disposition   Pt is being discharged home today in  good condition.  Follow-up Plans & Appointments    Follow-up Information    Evans Lance, MD Follow up on 11/19/2016.   Specialty:  Cardiology Why:  2:00PM Contact information: [address] [place] [address] [place] Follow up.   Why:  Water quality scientist information: [place]  [id number] [place] Follow up.   Specialty:  Home Health Services Why:  Registered Nurse, Physical Therapy, Aide.  Contact information: [address] [place] [address] [phone]        Park Liter, MD Follow up.   Specialty:  Cardiology Why:  Office will contact you Contact information: [address] [place] [address] [phone]          Discharge Instructions    Amb Referral to Cardiac Rehabilitation    Complete by:  As directed    Diagnosis:  NSTEMI Comment - to [facility]      Discharge Medications   Current Discharge Medication List    START taking these medications   Details  acetaminophen (TYLENOL) 325 MG tablet Take 2 tablets (650 mg total) by mouth every 4 (four) hours as needed for headache or mild pain.    apixaban (ELIQUIS) 5 MG TABS tablet Take 1 tablet (5 mg total) by mouth 2 (two) times daily. Qty: 60 tablet, Refills: 11    clopidogrel (PLAVIX) 75 MG tablet Take 1 tablet (75 mg total) by mouth daily. Qty: 90 tablet, Refills: 3    isosorbide mononitrate (IMDUR) 30 MG 24 hr tablet Take 1 tablet (30 mg total) by mouth daily. Qty: 90 tablet, Refills: 3    nitroGLYCERIN (NITROSTAT) 0.4 MG SL tablet Place 1 tablet (0.4 mg total) under the tongue every 5 (five) minutes x 3 doses as needed for chest pain. Qty: 25 tablet, Refills: 3    pantoprazole (PROTONIX) 40 MG tablet Take 1 tablet (40 mg total) by mouth 2 (two) times daily. Qty: 60 tablet, Refills: 5      CONTINUE these medications which have CHANGED   Details  atorvastatin (LIPITOR) 80 MG tablet Take 1 tablet (80 mg total) by mouth daily at 6 PM. Qty: 90 tablet, Refills: 3      CONTINUE these medications which have NOT CHANGED   Details  albuterol (PROVENTIL) (2.5 MG/3ML) 0.083% nebulizer solution USE 1 VIAL VIA NEBULIZER 4 TIMES DAILY as needed for shortness of breath Refills: 0    metoprolol succinate (TOPROL-XL) 50 MG  24 hr tablet Take 1 tablet (50 mg total) by mouth daily. Qty: 30 tablet, Refills: 3    zolpidem (AMBIEN) 5 MG tablet Take 5 mg by mouth at bedtime as needed for sleep. Refills: 5      STOP taking these medications     aspirin EC 81 MG tablet      esomeprazole (NEXIUM) 20 MG capsule      losartan (COZAAR) 50 MG tablet      tamsulosin (FLOMAX) 0.4 MG CAPS capsule          Aspirin prescribed at discharge?  No: GI bleed High Intensity Statin Prescribed? (Lipitor 40-80mg  or Crestor 20-40mg ): Yes Beta Blocker Prescribed? Yes For EF <40%, was ACEI/ARB Prescribed? No: NA ADP Receptor Inhibitor Prescribed? (i.e. Plavix etc.-Includes Medically Managed Patients): No: NA For EF <40%, Aldosterone Inhibitor Prescribed? Yes Was EF  assessed during THIS hospitalization? Yes Was Cardiac Rehab II ordered? (Included Medically managed Patients): Yes   Outstanding Labs/Studies     Duration of Discharge Encounter   Greater than 30 minutes including physician time.  Angelena Form PA 09/26/2016, 1:04 PM

## 2016-09-25 NOTE — Progress Notes (Signed)
Patient being fitted for life vest has Discharge information.Barnet Pall, RN,BSN 09/25/2016 1:27 PM

## 2016-09-25 NOTE — Progress Notes (Addendum)
DAILY PROGRESS NOTE   Patient Name: Lonnie Boyd Date of Encounter: 09/25/2016  Hospital Problem List   Principal Problem:   NSTEMI (non-ST elevated myocardial infarction) [facility]) Active Problems:   Ischemic cardiomyopathy   Coronary artery disease involving native coronary artery of native heart with unstable angina pectoris ([place])   Torsades de pointes ([place])   Cardiac arrest, cause unspecified ([place])   Encounter for central line placement    Chief Complaint   No complaints, wants to go home  Subjective   Seen by EP yesterday. They are recommend a LifeVest and anticoagulation with plans for outpatient follow-up - this has been fitted this morning. Started on Plavix for medical therapy of CAD, however, no intervention performed. He has also had a-fib/flutter and systemic anticoagulation is recommended. Noted to have diffuse chest wall ecchymosis - there has been a steady decline in H/H, ?related to CPR.  Objective   Vitals:   09/24/16 0829 09/24/16 1500 09/24/16 1944 09/25/16 0523  BP:  (!) 104/53 (!) 97/54 107/73  Pulse: 99  (!) 103 95  Resp:  18 17 17   Temp:  98.1 F (36.7 C) 98.5 F (36.9 C) 98 F (36.7 C)  TempSrc:  Oral Oral Oral  SpO2:  96% 93% 96%  Weight:    182 lb 1.6 oz (82.6 kg)  Height:        Intake/Output Summary (Last 24 hours) at 09/25/16 1041 Last data filed at 09/25/16 0630  Gross per 24 hour  Intake              930 ml  Output              425 ml  Net              505 ml   Filed Weights   09/22/16 0315 09/23/16 0300 09/25/16 0523  Weight: 178 lb 4.8 oz (80.9 kg) 178 lb (80.7 kg) 182 lb 1.6 oz (82.6 kg)    Physical Exam   General appearance: alert and no distress Neck: no carotid bruit, no JVD and thyroid not enlarged, symmetric, no tenderness/mass/nodules Lungs: clear to auscultation bilaterally Heart: regular rate and rhythm Abdomen: soft, non-tender; bowel sounds normal; no masses,  no organomegaly Extremities: diffuse ecchymosis  around the torso Pulses: 2+ and symmetric Skin: ecchymosis Neurologic: Mental status: Alert, oriented, thought content appropriate Psych: Pleasant  Inpatient Medications    Scheduled Meds: . aspirin  81 mg Per Tube Daily  . atorvastatin  80 mg Oral q1800  . Chlorhexidine Gluconate Cloth  6 each Topical Daily  . clopidogrel  75 mg Per Tube Q breakfast  . isosorbide mononitrate  30 mg Oral Daily  . mouth rinse  15 mL Mouth Rinse BID  . metoprolol succinate  50 mg Oral Daily  . pantoprazole  40 mg Oral Q1200  . sodium chloride flush  10-40 mL Intracatheter Q12H  . sodium chloride flush  3 mL Intravenous Q12H    Continuous Infusions: . sodium chloride    . heparin 1,500 Units/hr (09/25/16 0220)    PRN Meds: sodium chloride, acetaminophen, albuterol, bisacodyl, docusate, metoprolol tartrate, nitroGLYCERIN, ondansetron (ZOFRAN) IV, oxyCODONE, sodium chloride flush, sodium chloride flush, zolpidem   Labs   Results for orders placed or performed during the hospital encounter of 09/20/16 (from the past 48 hour(s))  CBC     Status: Abnormal   Collection Time: 09/24/16  5:35 AM  Result Value Ref Range   WBC 9.8 4.0 - 10.5  K/uL   RBC 3.17 (L) 4.22 - 5.81 MIL/uL   Hemoglobin 9.9 (L) 13.0 - 17.0 g/dL   HCT 29.9 (L) 39.0 - 52.0 %   MCV 94.3 78.0 - 100.0 fL   MCH 31.2 26.0 - 34.0 pg   MCHC 33.1 30.0 - 36.0 g/dL   RDW 14.8 11.5 - 15.5 %   Platelets 178 150 - 400 K/uL  Heparin level (unfractionated)     Status: None   Collection Time: 09/24/16  5:35 AM  Result Value Ref Range   Heparin Unfractionated 0.57 0.30 - 0.70 IU/mL    Comment:        IF HEPARIN RESULTS ARE BELOW EXPECTED VALUES, AND PATIENT DOSAGE HAS BEEN CONFIRMED, SUGGEST FOLLOW UP TESTING OF ANTITHROMBIN III LEVELS.   CBC     Status: Abnormal   Collection Time: 09/25/16  5:01 AM  Result Value Ref Range   WBC 8.6 4.0 - 10.5 K/uL   RBC 2.83 (L) 4.22 - 5.81 MIL/uL   Hemoglobin 8.8 (L) 13.0 - 17.0 g/dL   HCT 26.3  (L) 39.0 - 52.0 %   MCV 92.9 78.0 - 100.0 fL   MCH 31.1 26.0 - 34.0 pg   MCHC 33.5 30.0 - 36.0 g/dL   RDW 14.7 11.5 - 15.5 %   Platelets 199 150 - 400 K/uL  Heparin level (unfractionated)     Status: None   Collection Time: 09/25/16  5:02 AM  Result Value Ref Range   Heparin Unfractionated 0.53 0.30 - 0.70 IU/mL    Comment:        IF HEPARIN RESULTS ARE BELOW EXPECTED VALUES, AND PATIENT DOSAGE HAS BEEN CONFIRMED, SUGGEST FOLLOW UP TESTING OF ANTITHROMBIN III LEVELS.     ECG   N/A  Telemetry   PAF - Personally Reviewed  Radiology    No results found.  Cardiac Studies   LV EF: 40% -   45%  ------------------------------------------------------------------- Indications:      CHF - 428.0.  ------------------------------------------------------------------- History:   PMH:  Limited for wall motion and mitral inflow. Coronary artery disease.  PMH:  Ischemic Cardiomyopathy. Myocardial infarction.  Risk factors:  Dyslipidemia.  ------------------------------------------------------------------- Study Conclusions  - Left ventricle: The cavity size was normal. There was mild   concentric hypertrophy. Systolic function was mildly to   moderately reduced. The estimated ejection fraction was in the   range of 40% to 45%. Diffuse hypokinesis. Severe hypokinesis of   the mid-apicalanterolateral, inferolateral, and apical   myocardium. Features are consistent with a pseudonormal left   ventricular filling pattern, with concomitant abnormal relaxation   and increased filling pressure (grade 2 diastolic dysfunction). - Aortic valve: There was no regurgitation. - Mitral valve: Transvalvular velocity was within the normal range.   There was no evidence for stenosis. There was trivial   regurgitation. - Left atrium: The atrium was mildly dilated. - Right ventricle: The cavity size was normal. Wall thickness was   normal. Systolic function was normal. - Tricuspid valve:  There was no regurgitation.  Impressions:  - Compared with the echo 06/28/52, systolic function has improved.  Assessment   1. Principal Problem: 2.   NSTEMI (non-ST elevated myocardial infarction) ([place]) 3. Active Problems: 4.   Ischemic cardiomyopathy 5.   Coronary artery disease involving native coronary artery of native heart with unstable angina pectoris ([place]) 6.   Torsades de pointes ([place]) 7.   Cardiac arrest, cause unspecified ([place]) 8.   Encounter for central line placement 9.  Plan   Feels better today. Was fitted with LifeVest and has demonstrated the use of it this morning. He has a skin burn from defibrillation over the left posterior flank area which is under the LifeVest. He'll need a minimal dressing to allow the best work properly. He is currently on heparin and we will need to discontinue the triple lumen catheter. Since he's had atrial fibrillation and atrial flutter he'll need systemic anticoagulation however he has significant ecchymosis across the torso and is at high risk of bleeding on triple therapy. H/H is declining. Will obtain a non-contrast chest CT to evaluate. I advised discontinuing aspirin and treating him with Plavix and a Eliquis. Will hold on Eliquis until CT results are available.  Not ready for d/c today.  Time Spent Directly with Patient:  I have spent a total of 25 minutes with the patient reviewing hospital notes, telemetry, EKGs, labs and examining the patient as well as establishing an assessment and plan that was discussed personally with the patient. > 50% of time was spent in direct patient care.  Length of Stay:  LOS: 5 days   Pixie Casino, MD, [place]  Attending Cardiologist  Direct Dial: [phone]  Fax: [phone]  Website:  [email or web address] 09/25/2016, 10:41 AM

## 2016-09-26 DIAGNOSIS — S20212A Contusion of left front wall of thorax, initial encounter: Secondary | ICD-10-CM | POA: Diagnosis not present

## 2016-09-26 DIAGNOSIS — D62 Acute posthemorrhagic anemia: Secondary | ICD-10-CM

## 2016-09-26 DIAGNOSIS — K922 Gastrointestinal hemorrhage, unspecified: Secondary | ICD-10-CM

## 2016-09-26 DIAGNOSIS — S2243XA Multiple fractures of ribs, bilateral, initial encounter for closed fracture: Secondary | ICD-10-CM | POA: Diagnosis not present

## 2016-09-26 LAB — CULTURE, BLOOD (ROUTINE X 2)
CULTURE: NO GROWTH
CULTURE: NO GROWTH
SPECIAL REQUESTS: ADEQUATE
Special Requests: ADEQUATE

## 2016-09-26 LAB — CBC
HEMATOCRIT: 27.2 % — AB (ref 39.0–52.0)
Hemoglobin: 9.1 g/dL — ABNORMAL LOW (ref 13.0–17.0)
MCH: 31.3 pg (ref 26.0–34.0)
MCHC: 33.5 g/dL (ref 30.0–36.0)
MCV: 93.5 fL (ref 78.0–100.0)
Platelets: 210 10*3/uL (ref 150–400)
RBC: 2.91 MIL/uL — ABNORMAL LOW (ref 4.22–5.81)
RDW: 14.7 % (ref 11.5–15.5)
WBC: 9 10*3/uL (ref 4.0–10.5)

## 2016-09-26 MED ORDER — PANTOPRAZOLE SODIUM 40 MG PO TBEC: 40.0000 mg | DELAYED_RELEASE_TABLET | Freq: Two times a day (BID) | ORAL | Status: DC

## 2016-09-26 MED ORDER — CLOPIDOGREL BISULFATE 75 MG PO TABS
75.0000 mg | ORAL_TABLET | Freq: Every day | ORAL | Status: DC
  Administered 2016-09-26: 75 mg via ORAL
  Filled 2016-09-26: qty 1

## 2016-09-26 MED ORDER — CLOPIDOGREL BISULFATE 75 MG PO TABS: 75.0000 mg | ORAL_TABLET | Freq: Every day | ORAL | 3 refills | Status: DC

## 2016-09-26 MED ORDER — APIXABAN 5 MG PO TABS: 5.0000 mg | ORAL_TABLET | Freq: Two times a day (BID) | ORAL | 11 refills | Status: DC

## 2016-09-26 MED ORDER — APIXABAN 5 MG PO TABS
5.0000 mg | ORAL_TABLET | Freq: Two times a day (BID) | ORAL | Status: DC
  Administered 2016-09-26: 5 mg via ORAL
  Filled 2016-09-26: qty 1

## 2016-09-26 MED ORDER — PANTOPRAZOLE SODIUM 40 MG PO TBEC: 40.0000 mg | DELAYED_RELEASE_TABLET | Freq: Two times a day (BID) | ORAL | 5 refills | Status: DC

## 2016-09-26 NOTE — Progress Notes (Signed)
Discussed with Dr. Paulita Fujita who is on-call for gastroenterology today. I presented the case and he felt that he would be unlikely to perform an EGD given his recent presentation as he would be at high risk for sedation. He would recommend high-dose PPI. Based on that we will restart clopidogrel and Eliquis. The patient desires to be discharged as his wife is now on hospice. He will need early follow-up next week with his primary cardiologist and repeat labs to ensure stable hemoglobin.  Pixie Casino, MD, [place]  Attending Cardiologist  Direct Dial: [phone]  Fax: [phone]  Website:  [email or web address]

## 2016-09-26 NOTE — Progress Notes (Signed)
Pt on continuous cardiac monitoring. Life vest removed for now. Battery placed in charger. Pt kept NPO for now until seen by MD d/t Hgb = 9.1 and heme + stool.

## 2016-09-26 NOTE — Progress Notes (Signed)
DAILY PROGRESS NOTE   Patient Name: Lonnie Boyd Date of Encounter: 09/26/2016  Hospital Problem List   Principal Problem:   NSTEMI (non-ST elevated myocardial infarction) [facility]) Active Problems:   Ischemic cardiomyopathy   CAD- prior PCIs- cath- moderate CAD 09/22/16   Torsades de pointes ([place])   Cardiac arrest, cause unspecified ([place])   Encounter for central line placement   PAF (paroxysmal atrial fibrillation) ([place])   Anticoagulated-Eliquis   Cardiogenic shock ([place])   AKI (acute kidney injury) ([place])   Shock liver   Acute respiratory distress   Anemia    Chief Complaint   No issues overnight.   Subjective   CT chest yesterday for drop in hemoglobin - chest wall ecchymosis. This demonstrated minimally displaced acute anterior 4th, 5th and 6th rib fractures with left chest wall pectoral muscle and subcutaneous contusion. There is mild pulmonary edema and large hiatal hernia. Hemoccult positive - he has reported dark stools. History of gastric ulcer when he was younger.   Objective   Vitals:   09/25/16 1111 09/25/16 1432 09/25/16 2005 09/26/16 0446  BP: 104/60 (!) 97/56 (!) 117/56 110/66  Pulse: 70 70 73 71  Resp:  15 16 17   Temp:  98.4 F (36.9 C) 98.2 F (36.8 C) 98 F (36.7 C)  TempSrc:  Oral Oral Oral  SpO2:  97% 97% 96%  Weight:    183 lb 3.2 oz (83.1 kg)  Height:        Intake/Output Summary (Last 24 hours) at 09/26/16 1153 Last data filed at 09/26/16 7858  Gross per 24 hour  Intake              970 ml  Output              350 ml  Net              620 ml    Filed Weights   09/23/16 0300 09/25/16 0523 09/26/16 0446  Weight: 178 lb (80.7 kg) 182 lb 1.6 oz (82.6 kg) 183 lb 3.2 oz (83.1 kg)    Physical Exam   General appearance: alert and no distress Neck: no carotid bruit, no JVD and thyroid not enlarged, symmetric, no tenderness/mass/nodules Lungs: clear to auscultation bilaterally Heart: regular rate and rhythm Abdomen: soft, non-tender; bowel  sounds normal; no masses,  no organomegaly Extremities: diffuse ecchymosis around the torso Pulses: 2+ and symmetric Skin: pale, warm, dry Neurologic: Mental status: Alert, oriented, thought content appropriate Psych: tearful  Inpatient Medications    Scheduled Meds: . apixaban  5 mg Oral BID  . atorvastatin  80 mg Oral q1800  . Chlorhexidine Gluconate Cloth  6 each Topical Daily  . clopidogrel  75 mg Per Tube Q breakfast  . isosorbide mononitrate  30 mg Oral Daily  . mouth rinse  15 mL Mouth Rinse BID  . metoprolol succinate  50 mg Oral Daily  . pantoprazole  40 mg Oral Q1200  . sodium chloride flush  10-40 mL Intracatheter Q12H  . sodium chloride flush  3 mL Intravenous Q12H    Continuous Infusions: . sodium chloride      PRN Meds: sodium chloride, acetaminophen, albuterol, bisacodyl, docusate, metoprolol tartrate, nitroGLYCERIN, ondansetron (ZOFRAN) IV, oxyCODONE, sodium chloride flush, sodium chloride flush, zolpidem   Labs   Results for orders placed or performed during the hospital encounter of 09/20/16 (from the past 48 hour(s))  CBC     Status: Abnormal   Collection Time: 09/25/16  5:01 AM  Result Value Ref Range   WBC 8.6 4.0 - 10.5 K/uL   RBC 2.83 (L) 4.22 - 5.81 MIL/uL   Hemoglobin 8.8 (L) 13.0 - 17.0 g/dL   HCT 26.3 (L) 39.0 - 52.0 %   MCV 92.9 78.0 - 100.0 fL   MCH 31.1 26.0 - 34.0 pg   MCHC 33.5 30.0 - 36.0 g/dL   RDW 14.7 11.5 - 15.5 %   Platelets 199 150 - 400 K/uL  Heparin level (unfractionated)     Status: None   Collection Time: 09/25/16  5:02 AM  Result Value Ref Range   Heparin Unfractionated 0.53 0.30 - 0.70 IU/mL    Comment:        IF HEPARIN RESULTS ARE BELOW EXPECTED VALUES, AND PATIENT DOSAGE HAS BEEN CONFIRMED, SUGGEST FOLLOW UP TESTING OF ANTITHROMBIN III LEVELS.   Urinalysis, Routine w reflex microscopic     Status: Abnormal   Collection Time: 09/25/16  1:50 PM  Result Value Ref Range   Color, Urine YELLOW YELLOW   APPearance  HAZY (A) CLEAR   Specific Gravity, Urine 1.015 1.005 - 1.030   pH 5.0 5.0 - 8.0   Glucose, UA NEGATIVE NEGATIVE mg/dL   Hgb urine dipstick LARGE (A) NEGATIVE   Bilirubin Urine NEGATIVE NEGATIVE   Ketones, ur NEGATIVE NEGATIVE mg/dL   Protein, ur NEGATIVE NEGATIVE mg/dL   Nitrite NEGATIVE NEGATIVE   Leukocytes, UA NEGATIVE NEGATIVE   RBC / HPF 6-30 0 - 5 RBC/hpf   WBC, UA 6-30 0 - 5 WBC/hpf   Bacteria, UA RARE (A) NONE SEEN   Squamous Epithelial / LPF 0-5 (A) NONE SEEN   Mucus PRESENT   Occult blood card to lab, stool RN will collect     Status: Abnormal   Collection Time: 09/25/16  5:50 PM  Result Value Ref Range   Fecal Occult Bld POSITIVE (A) NEGATIVE  CBC     Status: Abnormal   Collection Time: 09/26/16  5:05 AM  Result Value Ref Range   WBC 9.0 4.0 - 10.5 K/uL   RBC 2.91 (L) 4.22 - 5.81 MIL/uL   Hemoglobin 9.1 (L) 13.0 - 17.0 g/dL   HCT 27.2 (L) 39.0 - 52.0 %   MCV 93.5 78.0 - 100.0 fL   MCH 31.3 26.0 - 34.0 pg   MCHC 33.5 30.0 - 36.0 g/dL   RDW 14.7 11.5 - 15.5 %   Platelets 210 150 - 400 K/uL    ECG   N/A  Telemetry   PAF - Personally Reviewed  Radiology    Ct Chest Wo Contrast  Result Date: 09/25/2016 CLINICAL DATA:  Inpatient. Status post CPR. Clinical concern for chest injury. EXAM: CT CHEST WITHOUT CONTRAST TECHNIQUE: Multidetector CT imaging of the chest was performed following the standard protocol without IV contrast. COMPARISON:  09/22/2016 chest radiograph. FINDINGS: Cardiovascular: Cardiomegaly . No significant pericardial fluid/thickening. Left anterior descending, left circumflex and right coronary atherosclerosis. Right subclavian central venous catheter terminates at the cavoatrial junction. Atherosclerotic nonaneurysmal thoracic aorta. Normal caliber pulmonary arteries. Mediastinum/Nodes: No discrete thyroid nodules. Unremarkable esophagus. No pathologically enlarged axillary, mediastinal or gross hilar lymph nodes, noting limited sensitivity for the  detection of hilar adenopathy on this noncontrast study. Coarse calcified granulomatous right hilar nodes. Lungs/Pleura: No pneumothorax. Small dependent bilateral pleural effusions. Moderate compressive atelectasis in the dependent right lower lobe. Mild compressive atelectasis in the dependent left lower lobe and left upper lobe. Mild patchy ground-glass attenuation and mild interlobular septal thickening in both lungs. Otherwise  no acute consolidative airspace disease, lung masses or significant pulmonary nodules in the aerated portions of the lungs. Upper abdomen: Multiple simple liver cysts scattered throughout the liver, largest 4.8 cm in the anterior left liver lobe. Large hiatal hernia, increased. Colonic diverticulosis. Musculoskeletal: No aggressive appearing focal osseous lesions. Minimally displaced acute anterior left fourth, fifth and sixth rib fractures with overlying ventral left chest wall pectoral muscle and subcutaneous contusion. Moderate thoracic spondylosis. IMPRESSION: 1. Minimally displaced acute anterior left fourth, fifth and sixth rib fractures with overlying ventral left chest wall pectoral muscle and subcutaneous contusion. No pneumothorax. 2. Small dependent bilateral pleural effusions . 3. Cardiomegaly. Mild patchy ground-glass attenuation and mild interlobular septal thickening in both lungs, suggesting mild pulmonary edema. 4. Three-vessel coronary atherosclerosis . 5. Large hiatal hernia. 6. Mild to moderate bibasilar atelectasis . Electronically Signed   By: Ilona Sorrel M.D.   On: 09/25/2016 17:53    Cardiac Studies   LV EF: 40% -   45%  ------------------------------------------------------------------- Indications:      CHF - 428.0.  ------------------------------------------------------------------- History:   PMH:  Limited for wall motion and mitral inflow. Coronary artery disease.  PMH:  Ischemic Cardiomyopathy. Myocardial infarction.  Risk factors:   Dyslipidemia.  ------------------------------------------------------------------- Study Conclusions  - Left ventricle: The cavity size was normal. There was mild   concentric hypertrophy. Systolic function was mildly to   moderately reduced. The estimated ejection fraction was in the   range of 40% to 45%. Diffuse hypokinesis. Severe hypokinesis of   the mid-apicalanterolateral, inferolateral, and apical   myocardium. Features are consistent with a pseudonormal left   ventricular filling pattern, with concomitant abnormal relaxation   and increased filling pressure (grade 2 diastolic dysfunction). - Aortic valve: There was no regurgitation. - Mitral valve: Transvalvular velocity was within the normal range.   There was no evidence for stenosis. There was trivial   regurgitation. - Left atrium: The atrium was mildly dilated. - Right ventricle: The cavity size was normal. Wall thickness was   normal. Systolic function was normal. - Tricuspid valve: There was no regurgitation.  Impressions:  - Compared with the echo 5/32/99, systolic function has improved.  Assessment   Principal Problem:   NSTEMI (non-ST elevated myocardial infarction) [facility]) Active Problems:   Ischemic cardiomyopathy   CAD- prior PCIs- cath- moderate CAD 09/22/16   Torsades de pointes ([place])   Cardiac arrest, cause unspecified ([place])   Encounter for central line placement   PAF (paroxysmal atrial fibrillation) ([place])   Anticoagulated-Eliquis   Cardiogenic shock ([place])   AKI (acute kidney injury) ([place])   Shock liver   Acute respiratory distress   Anemia   Plan   Lonnie Boyd feels reasonable well today. Hemoglobin has stabilized. Anticoagulation and antiplatelets have been held. He should ideally be on Plavix and Eliquis, however, given the hemoccult positive and melenic stool, I'm concerned about UGIB. He also lost blood in the chest wall as seen on CT. Will ask GI to formally evaluate. Has been NPO -  hopefully can undergo EGD soon - he reports his wife is dying at [facility] currently - they have been married for 56 years.  Time Spent Directly with Patient:  I have spent a total of 25 minutes with the patient reviewing hospital notes, telemetry, EKGs, labs and examining the patient as well as establishing an assessment and plan that was discussed personally with the patient. > 50% of time was spent in direct patient care.  Length of Stay:  LOS: 6 days   Pixie Casino, MD, [place]  Attending Cardiologist  Direct Dial: [phone]  Fax: [phone]  Website:  [email or web address] 09/26/2016, 11:53 AM

## 2016-09-26 NOTE — Progress Notes (Signed)
Discharge Planning: NCM spoke to son. Explained Eliquis 30 day free trial card. Explained he will need to take the Rx to pharmacy this evening. Has dose due tonight. Explained [facility] [address] do [facility] in [facility]. Unit RN will provide pt with a Eliquis 30 day free trial card. Pt has RW in room. Faxed orders, facesheet and dc summary to [facility]. Jonnie Finner RN CCM Case Mgmt phone [phone]

## 2016-09-29 ENCOUNTER — Telehealth: Payer: Self-pay | Admitting: Cardiology

## 2016-09-29 ENCOUNTER — Other Ambulatory Visit: Payer: Self-pay

## 2016-09-29 DIAGNOSIS — Z79899 Other long term (current) drug therapy: Secondary | ICD-10-CM

## 2016-09-29 MED ORDER — ATORVASTATIN CALCIUM 40 MG PO TABS: 80.0000 mg | ORAL_TABLET | Freq: Every day | ORAL | 11 refills | Status: DC

## 2016-09-29 NOTE — Progress Notes (Signed)
Patient contacted regarding discharge from [facility] on 09/20/16.  Patient understands to follow up with provider Jenne Campus, MD on 10 at 11:00 at [facility]. Patient understands discharge instructions? yes Patient understands medications and regiment? Yes Patient understands to bring all medications to this visit? yes  Pt states that he is feeling well. I have ensured pt has the address and phone number to the office. Pt states the only problem he has had is related to the cholesterol medication. pt has requested 40 mg tablets of lipitor due to the size of the 80 mg tablet. I have sent in an RX for 80 mg daily but supplied the 40 mg tablets at which pt understand he will need to take 2 tablets daily.

## 2016-09-29 NOTE — Telephone Encounter (Signed)
Please see additional notes in chart.

## 2016-09-29 NOTE — Telephone Encounter (Signed)
New message    TOC appt made per Kerin Ransom on 10/04/16 at 11am with Dr. Agustin Cree.

## 2016-10-04 ENCOUNTER — Encounter: Payer: Self-pay | Admitting: Cardiology

## 2016-10-04 ENCOUNTER — Ambulatory Visit (INDEPENDENT_AMBULATORY_CARE_PROVIDER_SITE_OTHER): Payer: Medicare Other | Admitting: Cardiology

## 2016-10-04 VITALS — BP 114/68 | HR 76 | Resp 10 | Ht 68.0 in | Wt 183.0 lb

## 2016-10-04 DIAGNOSIS — R002 Palpitations: Secondary | ICD-10-CM

## 2016-10-04 DIAGNOSIS — I214 Non-ST elevation (NSTEMI) myocardial infarction: Secondary | ICD-10-CM

## 2016-10-04 DIAGNOSIS — I255 Ischemic cardiomyopathy: Secondary | ICD-10-CM | POA: Diagnosis not present

## 2016-10-04 DIAGNOSIS — I480 Paroxysmal atrial fibrillation: Secondary | ICD-10-CM

## 2016-10-04 DIAGNOSIS — Z7901 Long term (current) use of anticoagulants: Secondary | ICD-10-CM

## 2016-10-04 DIAGNOSIS — I4901 Ventricular fibrillation: Secondary | ICD-10-CM | POA: Diagnosis not present

## 2016-10-04 DIAGNOSIS — I469 Cardiac arrest, cause unspecified: Secondary | ICD-10-CM | POA: Diagnosis not present

## 2016-10-04 NOTE — Patient Instructions (Addendum)
Medication Instructions:  Your physician recommends that you continue on your current medications as directed. Please refer to the Current Medication list given to you today.  Labwork: Your physician recommends that you have labs today to check your blood cell count and your kidneys, liver and sodium and potassium levels.  Testing/Procedures: Your physician has requested that you have an echocardiogram. Echocardiography is a painless test that uses sound waves to create images of your heart. It provides your doctor with information about the size and shape of your heart and how well your heart's chambers and valves are working. This procedure takes approximately one hour. There are no restrictions for this procedure.   Follow-Up: Your physician recommends that you schedule a follow-up appointment in: 2-3 weeks with Dr. Agustin Cree   Any Other Special Instructions Will Be Listed Below (If Applicable).  Please note that any paperwork needing to be filled out by the provider will need to be addressed at the front desk prior to seeing the provider. Please note that any paperwork FMLA, Disability or other documents regarding health condition is subject to a $25.00 charge that must be received prior to completion of paperwork in the form of a money order or check.     If you need a refill on your cardiac medications before your next appointment, please call your pharmacy.

## 2016-10-04 NOTE — Telephone Encounter (Signed)
S/w [name] and have discussed the treatment plan per [place]. Arrangements for sooner appointment with echo and f.u were also completed at this time.

## 2016-10-04 NOTE — Telephone Encounter (Signed)
Family needs to know if he is going to require someone with him all the time.. Please call Lonnie Boyd.Marland Kitchen

## 2016-10-04 NOTE — Progress Notes (Signed)
Cardiology Office Note:    Date:  10/04/2016   ID:  Lonnie Boyd, DOB [birth year], MRN [id number]  PCP:  [name], MD  Cardiologist:  Jenne Campus, MD    Referring MD: [name], MD   Chief Complaint  Patient presents with  . Hospitalization Follow-up  Doing well now  History of Present Illness:    Lonnie Boyd is a 76 y.o. male  the history of coronary artery disease. [facility]. He will in with biochemical markers for myocardial infarction after that he being transferred to [facility] for potential cardiac catheterization however before session was done he sustained cardiac arrest requiring resuscitation cardiac catheterization showed nonobstructive lesion, no target lesions for intervention. He eventually was fine to have significantly diminished left ventricular ejection fraction 20-25% but echocardiogram repeated before discharge from Hospital showing improvement 4045%. Today he is doing fine and has having chest pain tightness squeezing pressure burning chest, complaining of being tired. He brought DO NOT RESUSCITATE form for me however at the same time he worse life [place]. His son is with him in the room. A long discussion about what DO NOT RESUSCITATE means and will reach conclusion that he needs another 2 weeks to reassess the situation. In about 2-3 weeks I will ask him to have repeated echo to recheck left ventricular ejection fraction, today we'll check his complete metabolic panel as well as CBC. Continue with LifeVest for now, we'll continue with anticoagulation.  Past Medical History:  Diagnosis Date  . CAD (coronary artery disease)   . Cardiomyopathy ([place])   . Hyperlipemia   . Ventral hernia     Past Surgical History:  Procedure Laterality Date  . KIDNEY SURGERY    . LEFT HEART CATH AND CORONARY ANGIOGRAPHY N/A 09/20/2016   Procedure: LEFT HEART CATH AND CORONARY ANGIOGRAPHY;  Surgeon:  Belva Crome, MD;  Location: [facility];  Service: Cardiovascular;  Laterality: N/A;    Current Medications: Current Meds  Medication Sig  . albuterol (PROVENTIL) (2.5 MG/3ML) 0.083% nebulizer solution USE 1 VIAL VIA NEBULIZER 4 TIMES DAILY as needed for shortness of breath  . apixaban (ELIQUIS) 5 MG TABS tablet Take 1 tablet (5 mg total) by mouth 2 (two) times daily.  Marland Kitchen atorvastatin (LIPITOR) 40 MG tablet Take 2 tablets (80 mg total) by mouth daily at 6 PM.  . clopidogrel (PLAVIX) 75 MG tablet Take 1 tablet (75 mg total) by mouth daily.  Marland Kitchen ipratropium (ATROVENT) 0.02 % nebulizer solution Take 1 mL by nebulization as needed.  . isosorbide mononitrate (IMDUR) 30 MG 24 hr tablet Take 1 tablet (30 mg total) by mouth daily.  . metoprolol succinate (TOPROL-XL) 50 MG 24 hr tablet Take 1 tablet (50 mg total) by mouth daily.  . nitroGLYCERIN (NITROSTAT) 0.4 MG SL tablet Place 1 tablet (0.4 mg total) under the tongue every 5 (five) minutes x 3 doses as needed for chest pain.  . pantoprazole (PROTONIX) 40 MG tablet Take 1 tablet (40 mg total) by mouth 2 (two) times daily.  Marland Kitchen zolpidem (AMBIEN) 5 MG tablet Take 5 mg by mouth at bedtime as needed for sleep.     Allergies:   Patient has no known allergies.   Social History   Social History  . Marital status: Married    Spouse name: N/A  . Number of children: N/A  . Years of education: N/A   Social History Main Topics  .  Smoking status: Never Smoker  . Smokeless tobacco: Never Used  . Alcohol use No  . Drug use: No  . Sexual activity: Not Asked   Other Topics Concern  . None   Social History Narrative  . None     Family History: The patient's family history includes Cancer in his father; Heart failure in his father; Hypertension in his father; Leukemia in his sister. ROS:   Please see the history of present illness.    All 14 point review of systems negative except as described per history of present  illness  EKGs/Labs/Other Studies Reviewed:      Recent Labs: 09/20/2016: B Natriuretic Peptide 556.1; TSH 1.879 09/22/2016: ALT 53 09/23/2016: BUN 23; Creatinine, Ser 0.98; Magnesium 1.9; Potassium 3.3; Sodium 137 09/26/2016: Hemoglobin 9.1; Platelets 210  Recent Lipid Panel    Component Value Date/Time   CHOL 81 09/21/2016 0440   TRIG 38 09/21/2016 0440   HDL 34 (L) 09/21/2016 0440   CHOLHDL 2.4 09/21/2016 0440   VLDL 8 09/21/2016 0440   LDLCALC 39 09/21/2016 0440    Physical Exam:    VS:  BP 114/68   Pulse 76   Resp 10   Ht 5\' 8"  (1.727 m)   Wt 183 lb (83 kg)   BMI 27.83 kg/m     Wt Readings from Last 3 Encounters:  10/04/16 183 lb (83 kg)  09/26/16 183 lb 3.2 oz (83.1 kg)     GEN:  Well nourished, well developed in no acute distress HEENT: Normal NECK: No JVD; No carotid bruits LYMPHATICS: No lymphadenopathy CARDIAC: RRR, no murmurs, no rubs, no gallops RESPIRATORY:  Clear to auscultation without rales, wheezing or rhonchi  ABDOMEN: Soft, non-tender, non-distended MUSCULOSKELETAL:  No edema; No deformity  SKIN: Warm and dry LOWER EXTREMITIES: no swelling NEUROLOGIC:  Alert and oriented x 3 PSYCHIATRIC:  Normal affect   ASSESSMENT:    1. Palpitations   2. Ischemic cardiomyopathy   3. Cardiac arrest with ventricular fibrillation ([place])   4. PAF (paroxysmal atrial fibrillation) ([place])   5. NSTEMI (non-ST elevated myocardial infarction) ([place])   6. Anticoagulated-Eliquis    PLAN:    In order of problems listed above:  1. Palpitations: Proximal mitral fibrillation appears to be in atrial fibrillation right now, we'll continue anticoagulation. 2. Ischemic myopathy: We'll repeat echocardiogram in about 2-3 weeks to wake acute decision regarding ICD. We'll continue also discussion about DO NOT RESUSCITATE. 3. Cardiac arrest with V. fib: Wearing LifeVest. 4. Recent non-STEMI: I suspect is related to stress induced cardiomyopathy related to his recent wife's death.  We'll reassess left ventricular ejection fraction within the next few weeks 5. Anticoagulation with Eliquis will continue for now but CBC will be checked today   Medication Adjustments/Labs and Tests Ordered: Current medicines are reviewed at length with the patient today.  Concerns regarding medicines are outlined above.  Orders Placed This Encounter  Procedures  . Comprehensive metabolic panel  . CBC with Differential/Platelet  . ECHOCARDIOGRAM COMPLETE   Medication changes: No orders of the defined types were placed in this encounter.   Signed, Park Liter, MD, [facility] 10/04/2016 12:19 PM    [place]

## 2016-10-05 LAB — CBC WITH DIFFERENTIAL/PLATELET
Basophils Absolute: 0 10*3/uL (ref 0.0–0.2)
Basos: 0 %
EOS (ABSOLUTE): 0.5 10*3/uL — ABNORMAL HIGH (ref 0.0–0.4)
EOS: 7 %
HEMATOCRIT: 35.3 % — AB (ref 37.5–51.0)
HEMOGLOBIN: 11.6 g/dL — AB (ref 13.0–17.7)
IMMATURE GRANS (ABS): 0 10*3/uL (ref 0.0–0.1)
IMMATURE GRANULOCYTES: 0 %
LYMPHS ABS: 1.5 10*3/uL (ref 0.7–3.1)
Lymphs: 22 %
MCH: 32.3 pg (ref 26.6–33.0)
MCHC: 32.9 g/dL (ref 31.5–35.7)
MCV: 98 fL — ABNORMAL HIGH (ref 79–97)
Monocytes Absolute: 0.7 10*3/uL (ref 0.1–0.9)
Monocytes: 10 %
Neutrophils Absolute: 4.2 10*3/uL (ref 1.4–7.0)
Neutrophils: 61 %
Platelets: 476 10*3/uL — ABNORMAL HIGH (ref 150–379)
RBC: 3.59 x10E6/uL — AB (ref 4.14–5.80)
RDW: 15.1 % (ref 12.3–15.4)
WBC: 7 10*3/uL (ref 3.4–10.8)

## 2016-10-05 LAB — COMPREHENSIVE METABOLIC PANEL
ALBUMIN: 3.8 g/dL (ref 3.5–4.8)
ALK PHOS: 117 IU/L (ref 39–117)
ALT: 21 IU/L (ref 0–44)
AST: 31 IU/L (ref 0–40)
Albumin/Globulin Ratio: 1.5 (ref 1.2–2.2)
BUN / CREAT RATIO: 8 — AB (ref 10–24)
BUN: 9 mg/dL (ref 8–27)
Bilirubin Total: 1.4 mg/dL — ABNORMAL HIGH (ref 0.0–1.2)
CO2: 20 mmol/L (ref 20–29)
CREATININE: 1.17 mg/dL (ref 0.76–1.27)
Calcium: 8.6 mg/dL (ref 8.6–10.2)
Chloride: 106 mmol/L (ref 96–106)
GFR calc Af Amer: 69 mL/min/{1.73_m2} (ref >59)
GFR calc non Af Amer: 59 mL/min/{1.73_m2} — ABNORMAL LOW (ref >59)
GLUCOSE: 111 mg/dL — AB (ref 65–99)
Globulin, Total: 2.6 g/dL (ref 1.5–4.5)
Potassium: 4.8 mmol/L (ref 3.5–5.2)
Sodium: 141 mmol/L (ref 134–144)
Total Protein: 6.4 g/dL (ref 6.0–8.5)

## 2016-10-18 ENCOUNTER — Ambulatory Visit (INDEPENDENT_AMBULATORY_CARE_PROVIDER_SITE_OTHER): Payer: Medicare Other | Admitting: Cardiology

## 2016-10-18 ENCOUNTER — Ambulatory Visit (HOSPITAL_BASED_OUTPATIENT_CLINIC_OR_DEPARTMENT_OTHER)
Admission: RE | Admit: 2016-10-18 | Discharge: 2016-10-18 | Disposition: A | Discharge status: Home / Self Care | Payer: Medicare Other | Source: Ambulatory Visit | Attending: Cardiology | Admitting: Cardiology

## 2016-10-18 ENCOUNTER — Encounter: Payer: Self-pay | Admitting: Cardiology

## 2016-10-18 VITALS — BP 130/80 | HR 68 | Resp 10 | Ht 68.0 in | Wt 172.0 lb

## 2016-10-18 DIAGNOSIS — I4901 Ventricular fibrillation: Secondary | ICD-10-CM

## 2016-10-18 DIAGNOSIS — R002 Palpitations: Secondary | ICD-10-CM

## 2016-10-18 DIAGNOSIS — I255 Ischemic cardiomyopathy: Secondary | ICD-10-CM | POA: Diagnosis not present

## 2016-10-18 DIAGNOSIS — I25110 Atherosclerotic heart disease of native coronary artery with unstable angina pectoris: Secondary | ICD-10-CM

## 2016-10-18 DIAGNOSIS — I2510 Atherosclerotic heart disease of native coronary artery without angina pectoris: Secondary | ICD-10-CM | POA: Diagnosis not present

## 2016-10-18 DIAGNOSIS — I469 Cardiac arrest, cause unspecified: Secondary | ICD-10-CM | POA: Diagnosis not present

## 2016-10-18 DIAGNOSIS — I083 Combined rheumatic disorders of mitral, aortic and tricuspid valves: Secondary | ICD-10-CM | POA: Diagnosis not present

## 2016-10-18 DIAGNOSIS — I480 Paroxysmal atrial fibrillation: Secondary | ICD-10-CM | POA: Diagnosis not present

## 2016-10-18 DIAGNOSIS — Z7901 Long term (current) use of anticoagulants: Secondary | ICD-10-CM

## 2016-10-18 MED ORDER — ISOSORBIDE MONONITRATE ER 60 MG PO TB24: 60.0000 mg | ORAL_TABLET | Freq: Every day | ORAL | 3 refills | Status: DC

## 2016-10-18 NOTE — Patient Instructions (Addendum)
Medication Instructions:  Your physician has recommended you make the following change in your medication:  1.) INCREASE Imdur to 60 mg daily.   Labwork: Your physician recommends that you have lab work today to check your kidney function, sodium level and potassium.  Testing/Procedures: None   Follow-Up: Your physician recommends that you schedule a follow-up appointment in: 2 weeks   Any Other Special Instructions Will Be Listed Below (If Applicable).  Please note that any paperwork needing to be filled out by the provider will need to be addressed at the front desk prior to seeing the provider. Please note that any paperwork FMLA, Disability or other documents regarding health condition is subject to a $25.00 charge that must be received prior to completion of paperwork in the form of a money order or check.     If you need a refill on your cardiac medications before your next appointment, please call your pharmacy.

## 2016-10-18 NOTE — Progress Notes (Signed)
  Echocardiogram 2D Echocardiogram has been performed.  Tresa Res 10/18/2016, 2:41 PM

## 2016-10-18 NOTE — Progress Notes (Signed)
Cardiology Office Note:    Date:  10/18/2016   ID:  Lonnie Boyd, DOB February 02, 1940, MRN [id number]      PCP:  [name], MD  Cardiologist:  Jenne Campus, MD    Referring MD: [name], MD   Chief Complaint  Patient presents with  . 2 week follow up    Echo done today  omes today for follow-up and he is doing great  History of Present Illness:    Lonnie Boyd is a 76 y.o. male  With cardiomyopathy. He did have cardiac arrest while in the hospital. Was successfully resuscitated. Cardiac catheterization done at that time showed no significant obstruction however his ejection fraction was 20-25%. Repeated echocardiogram before leaving hospital showed ejection fraction 40-45%. He was sent home with life vest. Last time we had multiple discussion about prognosis and therapy. He actually brought DO NOT RESUSCITATE papers. However asked him to hold with filling of the tapering simply didn't make sense to have LifeVest on a talk about the fibular and talking about DO NOT RESUSCITATE. Today he feels dramatically better he does not want to wear LifeVest anymore septal mother what he does not want to have it but wanted to have discussion about defibrillator. We talked in length about that we talked about the improvement he did have echocardiogram done today which I'll wait for results. We'll remove his LifeVest, well check Chem-7 to see if I can put him on ACE inhibitor.  Past Medical History:  Diagnosis Date  . CAD (coronary artery disease)   . Cardiomyopathy ([place])   . Hyperlipemia   . Ventral hernia     Past Surgical History:  Procedure Laterality Date  . KIDNEY SURGERY    . LEFT HEART CATH AND CORONARY ANGIOGRAPHY N/A 09/20/2016   Procedure: LEFT HEART CATH AND CORONARY ANGIOGRAPHY;  Surgeon: Belva Crome, MD;  Location: [facility];  Service: Cardiovascular;  Laterality: N/A;    Current Medications: Current Meds  Medication Sig  . albuterol (PROVENTIL)  (2.5 MG/3ML) 0.083% nebulizer solution USE 1 VIAL VIA NEBULIZER 4 TIMES DAILY as needed for shortness of breath  . apixaban (ELIQUIS) 5 MG TABS tablet Take 1 tablet (5 mg total) by mouth 2 (two) times daily.  Marland Kitchen atorvastatin (LIPITOR) 40 MG tablet Take 2 tablets (80 mg total) by mouth daily at 6 PM.  . clopidogrel (PLAVIX) 75 MG tablet Take 1 tablet (75 mg total) by mouth daily.  Marland Kitchen ipratropium (ATROVENT) 0.02 % nebulizer solution Take 1 mL by nebulization as needed.  . isosorbide mononitrate (IMDUR) 30 MG 24 hr tablet Take 1 tablet (30 mg total) by mouth daily.  . metoprolol succinate (TOPROL-XL) 50 MG 24 hr tablet Take 1 tablet (50 mg total) by mouth daily.  . nitroGLYCERIN (NITROSTAT) 0.4 MG SL tablet Place 1 tablet (0.4 mg total) under the tongue every 5 (five) minutes x 3 doses as needed for chest pain.  . pantoprazole (PROTONIX) 40 MG tablet Take 1 tablet (40 mg total) by mouth 2 (two) times daily.  Marland Kitchen zolpidem (AMBIEN) 5 MG tablet Take 5 mg by mouth at bedtime as needed for sleep.     Allergies:   Patient has no known allergies.   Social History   Social History  . Marital status: Married    Spouse name: N/A  . Number of children: N/A  . Years of education: N/A   Social History Main Topics  . Smoking status: Never Smoker  .  Smokeless tobacco: Never Used  . Alcohol use No  . Drug use: No  . Sexual activity: Not Asked   Other Topics Concern  . None   Social History Narrative  . None     Family History: The patient's family history includes Cancer in his father; Heart failure in his father; Hypertension in his father; Leukemia in his sister. ROS:   Please see the history of present illness.    All 14 point review of systems negative except as described per history of present illness  EKGs/Labs/Other Studies Reviewed:      Recent Labs: 09/20/2016: B Natriuretic Peptide 556.1; TSH 1.879 09/23/2016: Magnesium 1.9 10/04/2016: ALT 21; BUN 9; Creatinine, Ser 1.17; Hemoglobin  11.6; Platelets 476; Potassium 4.8; Sodium 141  Recent Lipid Panel    Component Value Date/Time   CHOL 81 09/21/2016 0440   TRIG 38 09/21/2016 0440   HDL 34 (L) 09/21/2016 0440   CHOLHDL 2.4 09/21/2016 0440   VLDL 8 09/21/2016 0440   LDLCALC 39 09/21/2016 0440    Physical Exam:    VS:  BP 130/80   Pulse 68   Resp 10   Ht 5\' 8"  (1.727 m)   Wt 172 lb (78 kg)   BMI 26.15 kg/m     Wt Readings from Last 3 Encounters:  10/18/16 172 lb (78 kg)  10/04/16 183 lb (83 kg)  09/26/16 183 lb 3.2 oz (83.1 kg)     GEN:  Well nourished, well developed in no acute distress HEENT: Normal NECK: No JVD; No carotid bruits LYMPHATICS: No lymphadenopathy CARDIAC: RRR, no murmurs, no rubs, no gallops RESPIRATORY:  Clear to auscultation without rales, wheezing or rhonchi  ABDOMEN: Soft, non-tender, non-distended MUSCULOSKELETAL:  No edema; No deformity  SKIN: Warm and dry LOWER EXTREMITIES: no swelling NEUROLOGIC:  Alert and oriented x 3 PSYCHIATRIC:  Normal affect   ASSESSMENT:    1. CAD- prior PCIs- cath- moderate CAD 09/22/16   2. Cardiac arrest with ventricular fibrillation ([place])   3. PAF (paroxysmal atrial fibrillation) ([place])   4. Anticoagulated-Eliquis    PLAN:    In order of problems listed above:  1. CAD: Minor/moderate basal last cardiac catheterization, asymptomatic 2. Cardiac Harris, wearing a life vest by one to have it removed. He wants to talk about defibrillator we'll schedule him to have EP appointment. 3. PAF: Anticoagulated which I will continue. 4. Cardiomyopathy: On appropriate medication will check Chem-7 to see if there is any room for ACE inhibitor.   Medication Adjustments/Labs and Tests Ordered: Current medicines are reviewed at length with the patient today.  Concerns regarding medicines are outlined above.  No orders of the defined types were placed in this encounter.  Medication changes: No orders of the defined types were placed in this  encounter.   Signed, Park Liter, MD, [facility] 10/18/2016 3:54 PM    [place]

## 2016-10-19 LAB — BASIC METABOLIC PANEL
BUN/Creatinine Ratio: 9 — ABNORMAL LOW (ref 10–24)
BUN: 10 mg/dL (ref 8–27)
CALCIUM: 8.7 mg/dL (ref 8.6–10.2)
CO2: 24 mmol/L (ref 20–29)
CREATININE: 1.17 mg/dL (ref 0.76–1.27)
Chloride: 105 mmol/L (ref 96–106)
GFR calc Af Amer: 69 mL/min/{1.73_m2} (ref >59)
GFR calc non Af Amer: 59 mL/min/{1.73_m2} — ABNORMAL LOW (ref >59)
GLUCOSE: 90 mg/dL (ref 65–99)
Potassium: 4.4 mmol/L (ref 3.5–5.2)
SODIUM: 141 mmol/L (ref 134–144)

## 2016-10-21 MED ORDER — LISINOPRIL 2.5 MG PO TABS: 2.5000 mg | ORAL_TABLET | Freq: Every day | ORAL | 6 refills | Status: DC

## 2016-10-21 NOTE — Telephone Encounter (Signed)
-----   Message from Park Liter, MD sent at 10/21/2016  8:45 AM EDT ----- chem7 perfect, add lisinopril 2.5 mg po qd  chem7 in 5 days

## 2016-10-25 ENCOUNTER — Telehealth: Payer: Self-pay | Admitting: Internal Medicine

## 2016-10-25 NOTE — Telephone Encounter (Signed)
°  New Prob   Requesting to speak to nurse regarding pt possibly being seen sooner. Also has some questions regarding driving privileges for pt. Please call.

## 2016-10-25 NOTE — Telephone Encounter (Signed)
Pt to see Dr. Curt Bears 10/26/2016.  No further action needed at this time.

## 2016-10-26 ENCOUNTER — Ambulatory Visit: Payer: Medicare Other | Admitting: Cardiology

## 2016-10-26 ENCOUNTER — Encounter (INDEPENDENT_AMBULATORY_CARE_PROVIDER_SITE_OTHER): Payer: Self-pay

## 2016-10-26 ENCOUNTER — Encounter: Payer: Self-pay | Admitting: Cardiology

## 2016-10-26 ENCOUNTER — Ambulatory Visit (INDEPENDENT_AMBULATORY_CARE_PROVIDER_SITE_OTHER): Payer: Medicare Other | Admitting: Cardiology

## 2016-10-26 VITALS — BP 118/78 | HR 77 | Ht 68.0 in | Wt 168.4 lb

## 2016-10-26 DIAGNOSIS — I4901 Ventricular fibrillation: Secondary | ICD-10-CM | POA: Diagnosis not present

## 2016-10-26 DIAGNOSIS — I469 Cardiac arrest, cause unspecified: Secondary | ICD-10-CM

## 2016-10-26 DIAGNOSIS — I480 Paroxysmal atrial fibrillation: Secondary | ICD-10-CM | POA: Diagnosis not present

## 2016-10-26 DIAGNOSIS — I25118 Atherosclerotic heart disease of native coronary artery with other forms of angina pectoris: Secondary | ICD-10-CM | POA: Diagnosis not present

## 2016-10-26 NOTE — Patient Instructions (Signed)
Medication Instructions:  Your physician recommends that you continue on your current medications as directed. Please refer to the Current Medication list given to you today.  -- If you need a refill on your cardiac medications before your next appointment, please call your pharmacy. --  Labwork: None ordered  Testing/Procedures: None ordered  Follow-Up: No follow up is needed at this time with Dr. Curt Bears.  He will see you on an as needed basis.   Thank you for choosing [facility]!!   Trinidad Curet, RN [phone]  Any Other Special Instructions Will Be Listed Below (If Applicable). No driving 6 months from when you passed out.  This is [place] DMV regulations/law

## 2016-10-26 NOTE — Progress Notes (Signed)
Electrophysiology Office Note   Date:  10/26/2016   ID:  Lonnie Boyd, DOB 07-Jul-1940, MRN [id number]  PCP:  [name], MD  Cardiologist:  Agustin Cree Primary Electrophysiologist:  [name] Meredith Leeds, MD    Chief Complaint  Patient presents with  . Advice Only    Cardiac arrest     History of Present Illness: Lonnie Boyd is a 76 y.o. male who is being seen today for the evaluation of cardiomyopathy at the request of [name], MD. Presenting today for electrophysiology evaluation. He has a history of coronary artery disease status post LAD and circumflex PCI in 2011. He was admitted to the hospital with an NSTEMI. He was scheduled for heart cath on 09/20/16 when he had polymorphic VT that deteriorated to VF and cardiac arrest. He had CPR for 10 minutes, was defibrillated once and received amiodarone. He was taken emergently to the cath lab. Catheterization revealed moderate coronary disease but no obvious culprit lesion. Echo at the time showed an ejection fraction of 15-20% and. He was found to have prolonged QTc in second-degree AV block. Amiodarone was thus Discontinued and abnormalities stabilized. Repeat echo showed an EF of 40-45%. He was seen in the hospital by EP who recommended LifeVest discharge and follow-up in 2 months with an echocardiogram. It was thought that he may have had coronary spasm.  Today, he denies symptoms of palpitations, chest pain, shortness of breath, orthopnea, PND, lower extremity edema, claudication, dizziness, presyncope, syncope, bleeding, or neurologic sequela. The patient is tolerating medications without difficulties. He is feeling much improved since his hospitalization. He is having no further episodes of chest pain. Prior to his hospitalization, his wife, who had end-stage Alzheimer's, died.   Past Medical History:  Diagnosis Date  . CAD (coronary artery disease)   . Cardiomyopathy ([place])   . Hyperlipemia   . Ventral hernia      Past Surgical History:  Procedure Laterality Date  . KIDNEY SURGERY    . LEFT HEART CATH AND CORONARY ANGIOGRAPHY N/A 09/20/2016   Procedure: LEFT HEART CATH AND CORONARY ANGIOGRAPHY;  Surgeon: Belva Crome, MD;  Location: [facility];  Service: Cardiovascular;  Laterality: N/A;     Current Outpatient Prescriptions  Medication Sig Dispense Refill  . albuterol (PROVENTIL) (2.5 MG/3ML) 0.083% nebulizer solution USE 1 VIAL VIA NEBULIZER 4 TIMES DAILY as needed for shortness of breath  0  . apixaban (ELIQUIS) 5 MG TABS tablet Take 1 tablet (5 mg total) by mouth 2 (two) times daily. 60 tablet 11  . atorvastatin (LIPITOR) 40 MG tablet Take 2 tablets (80 mg total) by mouth daily at 6 PM. 60 tablet 11  . clopidogrel (PLAVIX) 75 MG tablet Take 1 tablet (75 mg total) by mouth daily. 90 tablet 3  . ipratropium (ATROVENT) 0.02 % nebulizer solution Take 1 mL by nebulization as needed.  0  . isosorbide mononitrate (IMDUR) 60 MG 24 hr tablet Take 1 tablet (60 mg total) by mouth daily. 90 tablet 3  . lisinopril (PRINIVIL,ZESTRIL) 2.5 MG tablet Take 1 tablet (2.5 mg total) by mouth daily. 30 tablet 6  . metoprolol succinate (TOPROL-XL) 50 MG 24 hr tablet Take 1 tablet (50 mg total) by mouth daily. 30 tablet 3  . nitroGLYCERIN (NITROSTAT) 0.4 MG SL tablet Place 1 tablet (0.4 mg total) under the tongue every 5 (five) minutes x 3 doses as needed for chest pain. 25 tablet 3  . pantoprazole (PROTONIX) 40 MG tablet Take  1 tablet (40 mg total) by mouth 2 (two) times daily. 60 tablet 5  . zolpidem (AMBIEN) 5 MG tablet Take 5 mg by mouth at bedtime as needed for sleep.  5   No current facility-administered medications for this visit.     Allergies:   Patient has no known allergies.   Social History:  The patient  reports that he has never smoked. He has never used smokeless tobacco. He reports that he does not drink alcohol or use drugs.   Family History:  The patient's family history includes Cancer  in his father; Heart failure in his father; Hypertension in his father; Leukemia in his sister.    ROS:  Please see the history of present illness.   Otherwise, review of systems is positive for anxietym easy bruising.   All other systems are reviewed and negative.    PHYSICAL EXAM: VS:  BP 118/78   Pulse 77   Ht 5\' 8"  (1.727 m)   Wt 168 lb 6.4 oz (76.4 kg)   SpO2 95%   BMI 25.61 kg/m  , BMI Body mass index is 25.61 kg/m. GEN: Well nourished, well developed, in no acute distress  HEENT: normal  Neck: no JVD, carotid bruits, or masses Cardiac: RRR; no murmurs, rubs, or gallops,no edema  Respiratory:  clear to auscultation bilaterally, normal work of breathing GI: soft, nontender, nondistended, + BS MS: no deformity or atrophy  Skin: warm and dry Neuro:  Strength and sensation are intact Psych: euthymic mood, full affect  EKG:  EKG is not ordered today. Personal review of the ekg ordered 09/28/16 shows sinus tachycardia, LAFB, inferior and anterior infarct  Recent Labs: 09/20/2016: B Natriuretic Peptide 556.1; TSH 1.879 09/23/2016: Magnesium 1.9 10/04/2016: ALT 21; Hemoglobin 11.6; Platelets 476 10/18/2016: BUN 10; Creatinine, Ser 1.17; Potassium 4.4; Sodium 141    Lipid Panel     Component Value Date/Time   CHOL 81 09/21/2016 0440   TRIG 38 09/21/2016 0440   HDL 34 (L) 09/21/2016 0440   CHOLHDL 2.4 09/21/2016 0440   VLDL 8 09/21/2016 0440   LDLCALC 39 09/21/2016 0440     Wt Readings from Last 3 Encounters:  10/26/16 168 lb 6.4 oz (76.4 kg)  10/18/16 172 lb (78 kg)  10/04/16 183 lb (83 kg)      Other studies Reviewed: Additional studies/ records that were reviewed today include: TTE 10/18/16  Review of the above records today demonstrates:  - Left ventricle: The cavity size was normal. Wall thickness was   normal. Systolic function was mildly reduced. The estimated   ejection fraction was in the range of 45% to 50%. Mild diffuse   hypokinesis. - Aortic valve:  There was mild regurgitation originating from the   central coaptation point. - Mitral valve: Mildly calcified annulus. Normal thickness leaflets   . There was mild regurgitation. - Right atrium: The atrium was mildly dilated. - The rhythm appears to be atrial flutter.   ASSESSMENT AND PLAN:  1. VT/VF arrest: Patient had an arrest while in the hospital after an NSTEMI. His ejection fraction at the time was 15-20%. Cath showed no evidence of coronary disease. It was thought this was due to stress-induced cardio myopathy versus coronary spasm. Due to the improvement in his ejection fraction, Levaeh Vice plan to hold off on ICD implantation. I have discussed with both him and his son that he should not drive for 6 months after his event.  2. Atrial flutter: Currently on Eliquis. Currently asymptomatic. Continue  with current management.  This patients CHA2DS2-VASc Score and unadjusted Ischemic Stroke Rate (% per year) is equal to 7.2 % stroke rate/year from a score of 5  Above score calculated as 1 point each if present [CHF, HTN, DM, Vascular=MI/PAD/Aortic Plaque, Age if 65-74, or Male] Above score calculated as 2 points each if present [Age > 75, or Stroke/TIA/TE]  3. Coronary artery disease: Status post PCI to the LAD and circumflex. No current chest pain. Continue current management.  Current medicines are reviewed at length with the patient today.   The patient does not have concerns regarding his medicines.  The following changes were made today:  none  Labs/ tests ordered today include:  No orders of the defined types were placed in this encounter.    Disposition:   FU with Mirinda Monte PNR months  Signed, Lynnlee Revels Meredith Leeds, MD  10/26/2016 2:02 PM     [facility] [address] [place] [address] [phone] (office) [phone] (fax)

## 2016-10-27 ENCOUNTER — Other Ambulatory Visit: Payer: Self-pay

## 2016-10-27 ENCOUNTER — Ambulatory Visit: Payer: Medicare Other | Admitting: Cardiology

## 2016-10-27 ENCOUNTER — Other Ambulatory Visit (HOSPITAL_BASED_OUTPATIENT_CLINIC_OR_DEPARTMENT_OTHER): Payer: Medicare Other

## 2016-10-27 ENCOUNTER — Telehealth: Payer: Self-pay | Admitting: Cardiology

## 2016-10-27 MED ORDER — LOSARTAN POTASSIUM 25 MG PO TABS: 25.0000 mg | ORAL_TABLET | Freq: Every day | ORAL | 6 refills | Status: DC

## 2016-10-27 NOTE — Telephone Encounter (Signed)
New message     [name] (son) calling on behalf of patient  Who is hard of hearing. Calling to clarify if patient can drive now that he no longer is wearing life vest.  Also, patient having issues with dry cough. Patient seems to think it is coming from Lisinopril. Please call [name] [phone].   1. Name of Medication:lisinopril (PRINIVIL,ZESTRIL) 2.5 MG tablet  2. How are you currently taking this medication (dosage and times per day)? Take 1 tablet (2.5 mg total) by mouth daily.  3. Are you having a reaction (difficulty breathing--STAT)? Dry cough  4. What is your medication issue? Dry cough

## 2016-10-27 NOTE — Telephone Encounter (Signed)
S/w [name] regarding the med change from lisinopril 2.5 mg to losartan 25 mg. Rx has been sent to the pharmacy. Also addressed the driving concern and per Dr. Agustin Cree pt should avoid driving for 6 months according to Dr. Curt Bears.

## 2016-11-01 ENCOUNTER — Ambulatory Visit: Payer: Medicare Other | Admitting: Cardiology

## 2016-11-17 ENCOUNTER — Other Ambulatory Visit (HOSPITAL_BASED_OUTPATIENT_CLINIC_OR_DEPARTMENT_OTHER): Payer: Medicare Other

## 2016-11-19 ENCOUNTER — Ambulatory Visit: Payer: Medicare Other | Admitting: Internal Medicine

## 2016-11-19 ENCOUNTER — Encounter: Payer: Self-pay | Admitting: Cardiology

## 2016-11-19 ENCOUNTER — Ambulatory Visit (INDEPENDENT_AMBULATORY_CARE_PROVIDER_SITE_OTHER): Payer: Medicare Other | Admitting: Cardiology

## 2016-11-19 VITALS — BP 140/82 | Ht 68.0 in | Wt 167.4 lb

## 2016-11-19 DIAGNOSIS — I25110 Atherosclerotic heart disease of native coronary artery with unstable angina pectoris: Secondary | ICD-10-CM | POA: Diagnosis not present

## 2016-11-19 DIAGNOSIS — I255 Ischemic cardiomyopathy: Secondary | ICD-10-CM | POA: Diagnosis not present

## 2016-11-19 DIAGNOSIS — Z7901 Long term (current) use of anticoagulants: Secondary | ICD-10-CM | POA: Diagnosis not present

## 2016-11-19 DIAGNOSIS — I480 Paroxysmal atrial fibrillation: Secondary | ICD-10-CM

## 2016-11-19 MED ORDER — APIXABAN 5 MG PO TABS: 5.0000 mg | ORAL_TABLET | Freq: Two times a day (BID) | ORAL | 11 refills | Status: DC

## 2016-11-19 NOTE — Patient Instructions (Addendum)
Medication Instructions:  Your physician recommends that you continue on your current medications as directed. Please refer to the Current Medication list given to you today.  1. Avoid all over-the-counter antihistamines except Claritin/Loratadine and Zyrtec/Cetrizine. 2. Avoid all combination including cold sinus allergies flu decongestant and sleep medications 3. You can use Robitussin DM Mucinex and Mucinex DM for cough. 4. can use Tylenol aspirin ibuprofen and naproxen but no combinations such as sleep or sinus.  Labwork: Your physician recommends that you have lab work today to check your Kidney function as well as sodium level and potassium.   Testing/Procedures: None   Follow-Up: Your physician recommends that you schedule a follow-up appointment in: 6 weeks  Any Other Special Instructions Will Be Listed Below (If Applicable).  Please note that any paperwork needing to be filled out by the provider will need to be addressed at the front desk prior to seeing the provider. Please note that any paperwork FMLA, Disability or other documents regarding health condition is subject to a $25.00 charge that must be received prior to completion of paperwork in the form of a money order or check.     If you need a refill on your cardiac medications before your next appointment, please call your pharmacy.

## 2016-11-19 NOTE — Progress Notes (Signed)
Cardiology Office Note:    Date:  11/19/2016   ID:  Lonnie Boyd, DOB Mar 31, 1940, MRN [id number]  PCP:  [name], MD  Cardiologist:  Jenne Campus, MD    Referring MD: [name], MD   Chief Complaint  Patient presents with  . Follow-up    no new concerns  He is doing great  History of Present Illness:    Lonnie Boyd is a 76 y.o. male with history of cardiomyopathy, status post cardiac arrest related most likely stress-induced Cardiomyopathy with ejection fraction at the time of 15%.  Cardiac catheterization done at that time showed nonobstructive lesions.  An echocardiogram showed improvement left ventricular ejection fraction.  Clinically he is doing great denies having any chest pain tightness squeezing pressure burning chest.  The biggest complaint he has is the fact that he still cannot drive.  He was seen by EP team with consideration for ICD but since there is improvement in left ventricular ejection fraction and his cardiac arrest was related to acute non-STEMI decision has been made not to proceed with ICD.  Past Medical History:  Diagnosis Date  . CAD (coronary artery disease)   . Cardiomyopathy ([place])   . Hyperlipemia   . Ventral hernia     Past Surgical History:  Procedure Laterality Date  . KIDNEY SURGERY    . LEFT HEART CATH AND CORONARY ANGIOGRAPHY N/A 09/20/2016   Procedure: LEFT HEART CATH AND CORONARY ANGIOGRAPHY;  Surgeon: Belva Crome, MD;  Location: [facility];  Service: Cardiovascular;  Laterality: N/A;    Current Medications: No outpatient prescriptions have been marked as taking for the 11/19/16 encounter (Office Visit) with Park Liter, MD.     Allergies:   Patient has no known allergies.   Social History   Social History  . Marital status: Married    Spouse name: N/A  . Number of children: N/A  . Years of education: N/A   Social History Main Topics  . Smoking status: Never Smoker  . Smokeless  tobacco: Never Used  . Alcohol use No  . Drug use: No  . Sexual activity: Not Asked   Other Topics Concern  . None   Social History Narrative  . None     Family History: The patient's family history includes Cancer in his father; Heart failure in his father; Hypertension in his father; Leukemia in his sister. ROS:   Please see the history of present illness.    All 14 point review of systems negative except as described per history of present illness  EKGs/Labs/Other Studies Reviewed:      Recent Labs: 09/20/2016: B Natriuretic Peptide 556.1; TSH 1.879 09/23/2016: Magnesium 1.9 10/04/2016: ALT 21; Hemoglobin 11.6; Platelets 476 10/18/2016: BUN 10; Creatinine, Ser 1.17; Potassium 4.4; Sodium 141  Recent Lipid Panel    Component Value Date/Time   CHOL 81 09/21/2016 0440   TRIG 38 09/21/2016 0440   HDL 34 (L) 09/21/2016 0440   CHOLHDL 2.4 09/21/2016 0440   VLDL 8 09/21/2016 0440   LDLCALC 39 09/21/2016 0440    Physical Exam:    VS:  BP 140/82 (BP Location: Left Arm, Patient Position: Sitting, Cuff Size: Normal)   Ht 5\' 8"  (1.727 m)   Wt 167 lb 6.4 oz (75.9 kg)   BMI 25.45 kg/m     Wt Readings from Last 3 Encounters:  11/19/16 167 lb 6.4 oz (75.9 kg)  10/26/16 168 lb 6.4 oz (76.4 kg)  10/18/16  172 lb (78 kg)     GEN:  Well nourished, well developed in no acute distress HEENT: Normal NECK: No JVD; No carotid bruits LYMPHATICS: No lymphadenopathy CARDIAC: RRR, no murmurs, no rubs, no gallops RESPIRATORY:  Clear to auscultation without rales, wheezing or rhonchi  ABDOMEN: Soft, non-tender, non-distended MUSCULOSKELETAL:  No edema; No deformity  SKIN: Warm and dry LOWER EXTREMITIES: no swelling NEUROLOGIC:  Alert and oriented x 3 PSYCHIATRIC:  Normal affect   ASSESSMENT:    1. CAD- prior PCIs- cath- moderate CAD 09/22/16   2. Ischemic cardiomyopathy   3. PAF (paroxysmal atrial fibrillation) ([place])   4. Anticoagulated-Eliquis    PLAN:    In order of problems  listed above:  1. CAD: Nonobstructive lesions on appropriate medications child continue 2. Ischemic heart myopathy: Patient was tried on lisinopril however developed cough.  Now he is on losartan.  We will check Chem-7 today if Chem-7 is acceptable will increase dose of losartan.  He is scheduled to see his primary care physician on Wednesday therefore we will recheck his kidney function on Wednesday if the dose of losartan will be increased. 3. Paroxysmal atrial fibrillation: Anticoagulated which I will continue rate appears to be controlled now. 4. Because guarded with Eliquis.  Will continue.   Medication Adjustments/Labs and Tests Ordered: Current medicines are reviewed at length with the patient today.  Concerns regarding medicines are outlined above.  No orders of the defined types were placed in this encounter.  Medication changes:  Meds ordered this encounter  Medications  . apixaban (ELIQUIS) 5 MG TABS tablet    Sig: Take 1 tablet (5 mg total) by mouth 2 (two) times daily.    Dispense:  60 tablet    Refill:  11    Signed, Park Liter, MD, [facility] 11/19/2016 8:44 AM    [place]

## 2016-11-20 LAB — BASIC METABOLIC PANEL
BUN / CREAT RATIO: 15 (ref 10–24)
BUN: 17 mg/dL (ref 8–27)
CALCIUM: 8.8 mg/dL (ref 8.6–10.2)
CHLORIDE: 107 mmol/L — AB (ref 96–106)
CO2: 20 mmol/L (ref 20–29)
Creatinine, Ser: 1.13 mg/dL (ref 0.76–1.27)
GFR calc non Af Amer: 62 mL/min/{1.73_m2} (ref >59)
GFR, EST AFRICAN AMERICAN: 72 mL/min/{1.73_m2} (ref >59)
Glucose: 79 mg/dL (ref 65–99)
POTASSIUM: 4.2 mmol/L (ref 3.5–5.2)
Sodium: 142 mmol/L (ref 134–144)

## 2016-11-22 ENCOUNTER — Telehealth: Payer: Self-pay | Admitting: Cardiology

## 2016-11-22 NOTE — Telephone Encounter (Signed)
Wants to know if he can take something other than Eliquis because it is too expensive

## 2016-11-22 NOTE — Telephone Encounter (Signed)
Have spoken with pharmacy regarding concerns. Pt does not have rx drug coverage. Pt assistance form have been started and will be completed and sent ASAP.

## 2016-11-23 ENCOUNTER — Other Ambulatory Visit: Payer: Self-pay

## 2016-11-23 MED ORDER — APIXABAN 5 MG PO TABS
5.0000 mg | ORAL_TABLET | Freq: Two times a day (BID) | ORAL | 3 refills | Status: DC
Start: 2016-11-23 — End: 2018-08-29

## 2016-12-03 ENCOUNTER — Telehealth: Payer: Self-pay

## 2016-12-03 NOTE — Telephone Encounter (Signed)
Patient wants to thank you. Please return his call. Thank you!

## 2017-01-03 ENCOUNTER — Ambulatory Visit: Payer: Medicare Other | Admitting: Cardiology

## 2017-01-10 ENCOUNTER — Ambulatory Visit (INDEPENDENT_AMBULATORY_CARE_PROVIDER_SITE_OTHER): Payer: Medicare Other | Admitting: Cardiology

## 2017-01-10 ENCOUNTER — Encounter: Payer: Self-pay | Admitting: Cardiology

## 2017-01-10 VITALS — BP 116/70 | HR 75 | Resp 95 | Ht 68.0 in | Wt 165.0 lb

## 2017-01-10 DIAGNOSIS — I480 Paroxysmal atrial fibrillation: Secondary | ICD-10-CM

## 2017-01-10 DIAGNOSIS — Z7901 Long term (current) use of anticoagulants: Secondary | ICD-10-CM | POA: Diagnosis not present

## 2017-01-10 DIAGNOSIS — I25110 Atherosclerotic heart disease of native coronary artery with unstable angina pectoris: Secondary | ICD-10-CM

## 2017-01-10 DIAGNOSIS — I255 Ischemic cardiomyopathy: Secondary | ICD-10-CM

## 2017-01-10 NOTE — Progress Notes (Signed)
Cardiology Office Note:    Date:  01/10/2017   ID:  ONDRA DEBOARD, DOB [birth year], MRN [id number]  PCP:  [name], MD  Cardiologist:  Jenne Campus, MD    Referring MD: [name], MD   Chief Complaint  Patient presents with  . 6 week follow up  Doing well  History of Present Illness:    Lonnie Boyd is a 76 y.o. male with ischemic cardiomyopathy with initial ejection fraction 20-25% but latest improvement to 45-50%.  Overall clinically he is doing well.  Last time we stopped his lisinopril because of cough and will put him on losartan.  He seems to be doing quite well from that point of view.  Denies having any chest pain tightness squeezing pressure burning chest.  He is active and try to walk on the regular basis he is asking me again about possibility of driving I told him with needs another month to see how he does.  But overall he seems to be doing well.  Past Medical History:  Diagnosis Date  . CAD (coronary artery disease)   . Cardiomyopathy ([place])   . Hyperlipemia   . Ventral hernia     Past Surgical History:  Procedure Laterality Date  . KIDNEY SURGERY    . LEFT HEART CATH AND CORONARY ANGIOGRAPHY N/A 09/20/2016   Procedure: LEFT HEART CATH AND CORONARY ANGIOGRAPHY;  Surgeon: Belva Crome, MD;  Location: [facility];  Service: Cardiovascular;  Laterality: N/A;    Current Medications: Current Meds  Medication Sig  . albuterol (PROVENTIL) (2.5 MG/3ML) 0.083% nebulizer solution USE 1 VIAL VIA NEBULIZER 4 TIMES DAILY as needed for shortness of breath  . apixaban (ELIQUIS) 5 MG TABS tablet Take 1 tablet (5 mg total) by mouth 2 (two) times daily.  Marland Kitchen atorvastatin (LIPITOR) 40 MG tablet Take 2 tablets (80 mg total) by mouth daily at 6 PM.  . clopidogrel (PLAVIX) 75 MG tablet Take 1 tablet (75 mg total) by mouth daily.  Marland Kitchen escitalopram (LEXAPRO) 10 MG tablet Take 10 mg by mouth daily.  Marland Kitchen ipratropium (ATROVENT) 0.02 % nebulizer solution Take 1  mL by nebulization as needed.  . isosorbide mononitrate (IMDUR) 60 MG 24 hr tablet Take 1 tablet (60 mg total) by mouth daily.  Marland Kitchen losartan (COZAAR) 25 MG tablet Take 1 tablet (25 mg total) by mouth daily.  . metoprolol succinate (TOPROL-XL) 50 MG 24 hr tablet Take 1 tablet (50 mg total) by mouth daily.  . nitroGLYCERIN (NITROSTAT) 0.4 MG SL tablet Place 1 tablet (0.4 mg total) under the tongue every 5 (five) minutes x 3 doses as needed for chest pain.  . pantoprazole (PROTONIX) 40 MG tablet Take 1 tablet (40 mg total) by mouth 2 (two) times daily.  . sertraline (ZOLOFT) 50 MG tablet Take 50 mg by mouth daily.  Marland Kitchen zolpidem (AMBIEN) 5 MG tablet Take 5 mg by mouth at bedtime as needed for sleep.     Allergies:   Patient has no known allergies.   Social History   Socioeconomic History  . Marital status: Married    Spouse name: None  . Number of children: None  . Years of education: None  . Highest education level: None  Social Needs  . Financial resource strain: None  . Food insecurity - worry: None  . Food insecurity - inability: None  . Transportation needs - medical: None  . Transportation needs - non-medical: None  Occupational History  . None  Tobacco Use  . Smoking status: Never Smoker  . Smokeless tobacco: Never Used  Substance and Sexual Activity  . Alcohol use: No  . Drug use: No  . Sexual activity: None  Other Topics Concern  . None  Social History Narrative  . None     Family History: The patient's family history includes Cancer in his father; Heart failure in his father; Hypertension in his father; Leukemia in his sister. ROS:   Please see the history of present illness.    All 14 point review of systems negative except as described per history of present illness  EKGs/Labs/Other Studies Reviewed:      Recent Labs: 09/20/2016: B Natriuretic Peptide 556.1; TSH 1.879 09/23/2016: Magnesium 1.9 10/04/2016: ALT 21; Hemoglobin 11.6; Platelets 476 11/19/2016: BUN  17; Creatinine, Ser 1.13; Potassium 4.2; Sodium 142  Recent Lipid Panel    Component Value Date/Time   CHOL 81 09/21/2016 0440   TRIG 38 09/21/2016 0440   HDL 34 (L) 09/21/2016 0440   CHOLHDL 2.4 09/21/2016 0440   VLDL 8 09/21/2016 0440   LDLCALC 39 09/21/2016 0440    Physical Exam:    VS:  BP 116/70   Pulse 75   Resp (!) 95   Ht 5\' 8"  (1.727 m)   Wt 165 lb (74.8 kg)   BMI 25.09 kg/m     Wt Readings from Last 3 Encounters:  01/10/17 165 lb (74.8 kg)  11/19/16 167 lb 6.4 oz (75.9 kg)  10/26/16 168 lb 6.4 oz (76.4 kg)     GEN:  Well nourished, well developed in no acute distress HEENT: Normal NECK: No JVD; No carotid bruits LYMPHATICS: No lymphadenopathy CARDIAC: RRR, no murmurs, no rubs, no gallops RESPIRATORY:  Clear to auscultation without rales, wheezing or rhonchi  ABDOMEN: Soft, non-tender, non-distended MUSCULOSKELETAL:  No edema; No deformity  SKIN: Warm and dry LOWER EXTREMITIES: no swelling NEUROLOGIC:  Alert and oriented x 3 PSYCHIATRIC:  Normal affect   ASSESSMENT:    1. CAD- prior PCIs- cath- moderate CAD 09/22/16   2. PAF (paroxysmal atrial fibrillation) ([place])   3. Ischemic cardiomyopathy   4. Anticoagulated-Eliquis    PLAN:    In order of problems listed above:  1. Coronary artery disease doing well from that point of view asymptomatic. 2. Paroxysmal atrial fibrillation: Anticoagulated which I will continue. 3. Myopathy: I would like to increase dose of losartan however I will call Dr. Carolanne Grumbling his primary care physician and check what was the last Chem-7 if Chem-7 is okay then I will go up with losartan to 50 mg daily. 4. Anticoagulation with Eliquis for proximal mitral fibrillation her chads vas score.  Will continue.   Medication Adjustments/Labs and Tests Ordered: Current medicines are reviewed at length with the patient today.  Concerns regarding medicines are outlined above.  No orders of the defined types were placed in this  encounter.  Medication changes: No orders of the defined types were placed in this encounter.   Signed, Park Liter, MD, [facility] 01/10/2017 3:20 PM    [place]

## 2017-01-10 NOTE — Patient Instructions (Signed)
Medication Instructions:  Your physician recommends that you continue on your current medications as directed. Please refer to the Current Medication list given to you today.  Labwork: None ordered  Testing/Procedures: None ordered  Follow-Up: Your physician recommends that you schedule a follow-up appointment in: 2 months with Dr. Agustin Cree   Any Other Special Instructions Will Be Listed Below (If Applicable).     If you need a refill on your cardiac medications before your next appointment, please call your pharmacy.

## 2017-01-19 ENCOUNTER — Other Ambulatory Visit: Payer: Self-pay | Admitting: Cardiology

## 2017-01-26 DIAGNOSIS — I10 Essential (primary) hypertension: Secondary | ICD-10-CM | POA: Diagnosis not present

## 2017-01-26 DIAGNOSIS — I2510 Atherosclerotic heart disease of native coronary artery without angina pectoris: Secondary | ICD-10-CM | POA: Diagnosis not present

## 2017-01-26 DIAGNOSIS — R1013 Epigastric pain: Secondary | ICD-10-CM | POA: Diagnosis not present

## 2017-01-26 DIAGNOSIS — K430 Incisional hernia with obstruction, without gangrene: Secondary | ICD-10-CM | POA: Diagnosis not present

## 2017-02-14 DIAGNOSIS — J4540 Moderate persistent asthma, uncomplicated: Secondary | ICD-10-CM | POA: Diagnosis not present

## 2017-02-14 DIAGNOSIS — Z6826 Body mass index (BMI) 26.0-26.9, adult: Secondary | ICD-10-CM | POA: Diagnosis not present

## 2017-02-14 DIAGNOSIS — I480 Paroxysmal atrial fibrillation: Secondary | ICD-10-CM | POA: Diagnosis not present

## 2017-02-14 DIAGNOSIS — I2510 Atherosclerotic heart disease of native coronary artery without angina pectoris: Secondary | ICD-10-CM | POA: Diagnosis not present

## 2017-02-14 DIAGNOSIS — K439 Ventral hernia without obstruction or gangrene: Secondary | ICD-10-CM | POA: Diagnosis not present

## 2017-02-15 ENCOUNTER — Encounter: Payer: Self-pay | Admitting: Cardiology

## 2017-02-15 ENCOUNTER — Ambulatory Visit (INDEPENDENT_AMBULATORY_CARE_PROVIDER_SITE_OTHER): Payer: PPO | Admitting: Cardiology

## 2017-02-15 VITALS — BP 120/80 | HR 76 | Resp 16 | Ht 68.0 in | Wt 166.0 lb

## 2017-02-15 DIAGNOSIS — I480 Paroxysmal atrial fibrillation: Secondary | ICD-10-CM | POA: Diagnosis not present

## 2017-02-15 DIAGNOSIS — I429 Cardiomyopathy, unspecified: Secondary | ICD-10-CM | POA: Insufficient documentation

## 2017-02-15 DIAGNOSIS — I469 Cardiac arrest, cause unspecified: Secondary | ICD-10-CM

## 2017-02-15 DIAGNOSIS — I214 Non-ST elevation (NSTEMI) myocardial infarction: Secondary | ICD-10-CM

## 2017-02-15 DIAGNOSIS — I25110 Atherosclerotic heart disease of native coronary artery with unstable angina pectoris: Secondary | ICD-10-CM

## 2017-02-15 DIAGNOSIS — I4901 Ventricular fibrillation: Secondary | ICD-10-CM

## 2017-02-15 DIAGNOSIS — I255 Ischemic cardiomyopathy: Secondary | ICD-10-CM | POA: Diagnosis not present

## 2017-02-15 NOTE — Progress Notes (Signed)
Cardiology Office Note:    Date:  02/15/2017   ID:  Lonnie Boyd, DOB 1940-02-08, MRN [id number]  PCP:  [name], MD  Cardiologist:  Jenne Campus, MD    Referring MD: [name], MD   Chief Complaint  Patient presents with  . Follow up per Dr. Amalia Hailey  I need surgery  History of Present Illness:    Lonnie Boyd is a 77 y.o. male with history of cardiac arrest and non-STEMI in August 2018.  His ejection fraction was severely diminished 10-15% at this time.  He quite quickly and amazingly recovered.  His cardiac catheterization did not show critical lesions.  Occlusion was that there was probably some stress-induced cardia myopathy on top of his baseline ischemic cardiomyopathy.  His wife of 2 years died just few days before all those events.  He is doing great he is asymptomatic denies have any chest pain tightness squeezing pressure burning chest.  He does have abdominal hernia and surgery is contemplated.  He comes today to my office to talk about that.  I spent a great deal of time trying to explain to him that ideally we should wait for at least 6 months after coronary event.  Favorably year is needed.  I placed a call to his surgeon Dr. Amalia Hailey and we discussed his case.  According to surgeon we can wait.  However probably waiting for about a year is too long..  Therefore, I will see him back in my office in about 1 month and we will revisit that issue that will be about 6 months after his coronary event.  Another issue that he always wants to discuss is his ability to drive.  Again he did have a cardiac arrest and he required 10 minutes of CPR therefore I would definitely wait some longer make sure that he is stable. Today I will check his Chem-7 if Chem-7 is appropriate we will double the dose of losartan.  He is already on beta-blocker which I will continue.  Past Medical History:  Diagnosis Date  . CAD (coronary artery disease)   . Cardiomyopathy ([place])   .  Hyperlipemia   . Ventral hernia     Past Surgical History:  Procedure Laterality Date  . KIDNEY SURGERY    . LEFT HEART CATH AND CORONARY ANGIOGRAPHY N/A 09/20/2016   Procedure: LEFT HEART CATH AND CORONARY ANGIOGRAPHY;  Surgeon: Belva Crome, MD;  Location: [facility];  Service: Cardiovascular;  Laterality: N/A;    Current Medications: Current Meds  Medication Sig  . albuterol (PROVENTIL) (2.5 MG/3ML) 0.083% nebulizer solution USE 1 VIAL VIA NEBULIZER 4 TIMES DAILY as needed for shortness of breath  . apixaban (ELIQUIS) 5 MG TABS tablet Take 1 tablet (5 mg total) by mouth 2 (two) times daily.  Marland Kitchen atorvastatin (LIPITOR) 40 MG tablet Take 2 tablets (80 mg total) by mouth daily at 6 PM.  . clopidogrel (PLAVIX) 75 MG tablet Take 1 tablet (75 mg total) by mouth daily.  Marland Kitchen escitalopram (LEXAPRO) 10 MG tablet Take 10 mg by mouth daily.  Marland Kitchen ipratropium (ATROVENT) 0.02 % nebulizer solution Take 1 mL by nebulization as needed.  . isosorbide mononitrate (IMDUR) 60 MG 24 hr tablet Take 1 tablet (60 mg total) by mouth daily.  Marland Kitchen losartan (COZAAR) 25 MG tablet Take 1 tablet (25 mg total) by mouth daily.  . metoprolol succinate (TOPROL-XL) 50 MG 24 hr tablet TAKE ONE TABLET BY MOUTH DAILY  . nitroGLYCERIN (  NITROSTAT) 0.4 MG SL tablet Place 1 tablet (0.4 mg total) under the tongue every 5 (five) minutes x 3 doses as needed for chest pain.  . pantoprazole (PROTONIX) 40 MG tablet Take 1 tablet (40 mg total) by mouth 2 (two) times daily.  Marland Kitchen zolpidem (AMBIEN) 5 MG tablet Take 5 mg by mouth at bedtime as needed for sleep.     Allergies:   Patient has no known allergies.   Social History   Socioeconomic History  . Marital status: Married    Spouse name: None  . Number of children: None  . Years of education: None  . Highest education level: None  Social Needs  . Financial resource strain: None  . Food insecurity - worry: None  . Food insecurity - inability: None  . Transportation needs -  medical: None  . Transportation needs - non-medical: None  Occupational History  . None  Tobacco Use  . Smoking status: Never Smoker  . Smokeless tobacco: Never Used  Substance and Sexual Activity  . Alcohol use: No  . Drug use: No  . Sexual activity: None  Other Topics Concern  . None  Social History Narrative  . None     Family History: The patient's family history includes Cancer in his father; Heart failure in his father; Hypertension in his father; Leukemia in his sister. ROS:   Please see the history of present illness.    All 14 point review of systems negative except as described per history of present illness  EKGs/Labs/Other Studies Reviewed:      Recent Labs: 09/20/2016: B Natriuretic Peptide 556.1; TSH 1.879 09/23/2016: Magnesium 1.9 10/04/2016: ALT 21; Hemoglobin 11.6; Platelets 476 11/19/2016: BUN 17; Creatinine, Ser 1.13; Potassium 4.2; Sodium 142  Recent Lipid Panel    Component Value Date/Time   CHOL 81 09/21/2016 0440   TRIG 38 09/21/2016 0440   HDL 34 (L) 09/21/2016 0440   CHOLHDL 2.4 09/21/2016 0440   VLDL 8 09/21/2016 0440   LDLCALC 39 09/21/2016 0440    Physical Exam:    VS:  BP 120/80   Pulse 76   Resp 16   Ht 5\' 8"  (1.727 m)   Wt 166 lb (75.3 kg)   BMI 25.24 kg/m     Wt Readings from Last 3 Encounters:  02/15/17 166 lb (75.3 kg)  01/10/17 165 lb (74.8 kg)  11/19/16 167 lb 6.4 oz (75.9 kg)     GEN:  Well nourished, well developed in no acute distress HEENT: Normal NECK: No JVD; No carotid bruits LYMPHATICS: No lymphadenopathy CARDIAC: RRR, no murmurs, no rubs, no gallops RESPIRATORY:  Clear to auscultation without rales, wheezing or rhonchi  ABDOMEN: Soft, non-tender, non-distended MUSCULOSKELETAL:  No edema; No deformity  SKIN: Warm and dry LOWER EXTREMITIES: no swelling NEUROLOGIC:  Alert and oriented x 3 PSYCHIATRIC:  Normal affect   ASSESSMENT:    1. CAD- prior PCIs- cath- moderate CAD 09/22/16   2. Cardiac arrest with  ventricular fibrillation ([place])   3. NSTEMI (non-ST elevated myocardial infarction) ([place])   4. PAF (paroxysmal atrial fibrillation) ([place])   5. Ischemic cardiomyopathy    PLAN:    In order of problems listed above:  1. Coronary artery disease: Doing well on appropriate medications which I will continue 2. Status post cardiac arrest with V.  Tach.  He is doing well asymptomatic.  No dizziness no passing out 3. History of non-STEMI at the end of August 2018: Appropriate medications which I will continue 4.  Axis mitral fibrillation: Denies having.  Continue anticoagulation 5. With ischemic cardiomyopathy: Last ejection fraction 40-45% we will try to maximize his medical therapy   Medication Adjustments/Labs and Tests Ordered: Current medicines are reviewed at length with the patient today.  Concerns regarding medicines are outlined above.  No orders of the defined types were placed in this encounter.  Medication changes: No orders of the defined types were placed in this encounter.   Signed, Park Liter, MD, [facility] 02/15/2017 1:42 PM    [facility]

## 2017-02-15 NOTE — Patient Instructions (Signed)
Medication Instructions:  Your physician recommends that you continue on your current medications as directed. Please refer to the Current Medication list given to you today.  Labwork: Your physician recommends that you have lab work today: BMP  Testing/Procedures: None ordered  Follow-Up: Your physician recommends that you schedule a follow-up appointment in: at the end of February with Dr. Agustin Cree   Any Other Special Instructions Will Be Listed Below (If Applicable).     If you need a refill on your cardiac medications before your next appointment, please call your pharmacy.

## 2017-02-16 ENCOUNTER — Other Ambulatory Visit: Payer: Self-pay

## 2017-02-16 LAB — BASIC METABOLIC PANEL
BUN / CREAT RATIO: 13 (ref 10–24)
BUN: 14 mg/dL (ref 8–27)
CO2: 25 mmol/L (ref 20–29)
Calcium: 8.6 mg/dL (ref 8.6–10.2)
Chloride: 103 mmol/L (ref 96–106)
Creatinine, Ser: 1.08 mg/dL (ref 0.76–1.27)
GFR calc Af Amer: 76 mL/min/{1.73_m2} (ref >59)
GFR calc non Af Amer: 65 mL/min/{1.73_m2} (ref >59)
GLUCOSE: 100 mg/dL — AB (ref 65–99)
POTASSIUM: 4.3 mmol/L (ref 3.5–5.2)
SODIUM: 140 mmol/L (ref 134–144)

## 2017-02-16 MED ORDER — LOSARTAN POTASSIUM 50 MG PO TABS: 50.0000 mg | ORAL_TABLET | Freq: Every day | ORAL | 1 refills | Status: DC

## 2017-03-02 DIAGNOSIS — J441 Chronic obstructive pulmonary disease with (acute) exacerbation: Secondary | ICD-10-CM | POA: Diagnosis not present

## 2017-03-14 ENCOUNTER — Ambulatory Visit: Payer: Medicare Other | Admitting: Cardiology

## 2017-03-24 ENCOUNTER — Ambulatory Visit (INDEPENDENT_AMBULATORY_CARE_PROVIDER_SITE_OTHER): Payer: PPO | Admitting: Cardiology

## 2017-03-24 ENCOUNTER — Ambulatory Visit: Payer: Medicare Other | Admitting: Cardiology

## 2017-03-24 ENCOUNTER — Encounter: Payer: Self-pay | Admitting: Cardiology

## 2017-03-24 VITALS — BP 130/80 | HR 58 | Ht 68.0 in | Wt 166.0 lb

## 2017-03-24 DIAGNOSIS — I25110 Atherosclerotic heart disease of native coronary artery with unstable angina pectoris: Secondary | ICD-10-CM | POA: Diagnosis not present

## 2017-03-24 DIAGNOSIS — I4901 Ventricular fibrillation: Secondary | ICD-10-CM

## 2017-03-24 DIAGNOSIS — I469 Cardiac arrest, cause unspecified: Secondary | ICD-10-CM

## 2017-03-24 DIAGNOSIS — I480 Paroxysmal atrial fibrillation: Secondary | ICD-10-CM | POA: Diagnosis not present

## 2017-03-24 DIAGNOSIS — Z7901 Long term (current) use of anticoagulants: Secondary | ICD-10-CM

## 2017-03-24 NOTE — Progress Notes (Signed)
Cardiology Office Note:    Date:  03/24/2017   ID:  Lonnie Boyd, DOB 22-Mar-1940, MRN [id number]  PCP:  [name], MD  Cardiologist:  Jenne Campus, MD    Referring MD: [name], MD   Chief Complaint  Patient presents with  . 2 month follow up  Doing well  History of Present Illness:    Lonnie Boyd is a 77 y.o. male with history of probably stress-induced cardiomyopathy.  Cardiac catheterization done in August 2018 showed nonobstructive lesions.  He recovered quite dramatically and now his ejection fraction is 45-50.  He is asymptomatic is very active he can walk climb stairs with no difficulties no chest pain tightness squeezing pressure burning chest.  He is talking about 2 issues today #1 he would like to have his abdominal hernia surgery but this is already more than 6 months after his acute event therefore I think we can proceed with surgery.  Will be able to hold his anticoagulation that his Eliquis for 48 hours before procedure.  He is on beta-blocker and I would like to make sure that he stay on this medication around operative time.  Past Medical History:  Diagnosis Date  . CAD (coronary artery disease)   . Cardiomyopathy ([place])   . Hyperlipemia   . Ventral hernia     Past Surgical History:  Procedure Laterality Date  . KIDNEY SURGERY    . LEFT HEART CATH AND CORONARY ANGIOGRAPHY N/A 09/20/2016   Procedure: LEFT HEART CATH AND CORONARY ANGIOGRAPHY;  Surgeon: Belva Crome, MD;  Location: [facility];  Service: Cardiovascular;  Laterality: N/A;    Current Medications: Current Meds  Medication Sig  . albuterol (PROVENTIL) (2.5 MG/3ML) 0.083% nebulizer solution USE 1 VIAL VIA NEBULIZER 4 TIMES DAILY as needed for shortness of breath  . apixaban (ELIQUIS) 5 MG TABS tablet Take 1 tablet (5 mg total) by mouth 2 (two) times daily.  Marland Kitchen atorvastatin (LIPITOR) 40 MG tablet Take 2 tablets (80 mg total) by mouth daily at 6 PM.  . clopidogrel  (PLAVIX) 75 MG tablet Take 1 tablet (75 mg total) by mouth daily.  Marland Kitchen escitalopram (LEXAPRO) 10 MG tablet Take 10 mg by mouth daily.  Marland Kitchen ipratropium (ATROVENT) 0.02 % nebulizer solution Take 1 mL by nebulization as needed.  . isosorbide mononitrate (IMDUR) 60 MG 24 hr tablet Take 1 tablet (60 mg total) by mouth daily.  Marland Kitchen losartan (COZAAR) 50 MG tablet Take 1 tablet (50 mg total) by mouth daily.  . metoprolol succinate (TOPROL-XL) 50 MG 24 hr tablet TAKE ONE TABLET BY MOUTH DAILY  . nitroGLYCERIN (NITROSTAT) 0.4 MG SL tablet Place 1 tablet (0.4 mg total) under the tongue every 5 (five) minutes x 3 doses as needed for chest pain.  . pantoprazole (PROTONIX) 40 MG tablet Take 1 tablet (40 mg total) by mouth 2 (two) times daily.  Marland Kitchen zolpidem (AMBIEN) 5 MG tablet Take 5 mg by mouth at bedtime as needed for sleep.     Allergies:   Patient has no known allergies.   Social History   Socioeconomic History  . Marital status: Married    Spouse name: None  . Number of children: None  . Years of education: None  . Highest education level: None  Social Needs  . Financial resource strain: None  . Food insecurity - worry: None  . Food insecurity - inability: None  . Transportation needs - medical: None  . Transportation needs -  non-medical: None  Occupational History  . None  Tobacco Use  . Smoking status: Never Smoker  . Smokeless tobacco: Never Used  Substance and Sexual Activity  . Alcohol use: No  . Drug use: No  . Sexual activity: None  Other Topics Concern  . None  Social History Narrative  . None     Family History: The patient's family history includes Cancer in his father; Heart failure in his father; Hypertension in his father; Leukemia in his sister. ROS:   Please see the history of present illness.    All 14 point review of systems negative except as described per history of present illness  EKGs/Labs/Other Studies Reviewed:      Recent Labs: 09/20/2016: B Natriuretic  Peptide 556.1; TSH 1.879 09/23/2016: Magnesium 1.9 10/04/2016: ALT 21; Hemoglobin 11.6; Platelets 476 02/15/2017: BUN 14; Creatinine, Ser 1.08; Potassium 4.3; Sodium 140  Recent Lipid Panel    Component Value Date/Time   CHOL 81 09/21/2016 0440   TRIG 38 09/21/2016 0440   HDL 34 (L) 09/21/2016 0440   CHOLHDL 2.4 09/21/2016 0440   VLDL 8 09/21/2016 0440   LDLCALC 39 09/21/2016 0440    Physical Exam:    VS:  BP 130/80   Pulse (!) 58   Ht 5\' 8"  (1.727 m)   Wt 166 lb (75.3 kg)   SpO2 95%   BMI 25.24 kg/m     Wt Readings from Last 3 Encounters:  03/24/17 166 lb (75.3 kg)  02/15/17 166 lb (75.3 kg)  01/10/17 165 lb (74.8 kg)     GEN:  Well nourished, well developed in no acute distress HEENT: Normal NECK: No JVD; No carotid bruits LYMPHATICS: No lymphadenopathy CARDIAC: RRR, no murmurs, no rubs, no gallops RESPIRATORY:  Clear to auscultation without rales, wheezing or rhonchi  ABDOMEN: Soft, non-tender, non-distended MUSCULOSKELETAL:  No edema; No deformity  SKIN: Warm and dry LOWER EXTREMITIES: no swelling NEUROLOGIC:  Alert and oriented x 3 PSYCHIATRIC:  Normal affect   ASSESSMENT:    1. CAD- prior PCIs- cath- moderate CAD 09/22/16   2. Cardiac arrest with ventricular fibrillation ([place])   3. PAF (paroxysmal atrial fibrillation) ([place])   4. Anticoagulated-Eliquis    PLAN:    In order of problems listed above:  1. Coronary artery disease: Asymptomatic and appropriate medications which I will continue. 2. History of cardiac arrest with ventricular fibrillation in the face of acute stress-induced cardiomyopathy.  Good recovery with ejection fraction of 45-50%. Atrial fibrillation: Denies having any events recently.  Anticoagulated which I will continue.  Anticoagulation can be stopped for 48 hours on surgical time. Anticoagulation with Eliquis: Continue with instruction as above.  Terms of driving I told him he need to have his abdominal surgery done and after that will  evaluate him for possibly coming back to driving.   Medication Adjustments/Labs and Tests Ordered: Current medicines are reviewed at length with the patient today.  Concerns regarding medicines are outlined above.  No orders of the defined types were placed in this encounter.  Medication changes: No orders of the defined types were placed in this encounter.   Signed, Park Liter, MD, [facility] 03/24/2017 8:45 AM    [place]

## 2017-03-24 NOTE — Patient Instructions (Signed)
Medication Instructions:   Your physician recommends that you continue on your current medications as directed. Please refer to the Current Medication list given to you today.  Labwork:  None  Testing/Procedures:  None  Follow-Up:  Your physician recommends that you schedule a follow-up appointment in: 2 months.  Any Other Special Instructions Will Be Listed Below (If Applicable).  If you need a refill on your cardiac medications before your next appointment, please call your pharmacy. 

## 2017-03-29 ENCOUNTER — Telehealth: Payer: Self-pay | Admitting: Cardiology

## 2017-03-29 DIAGNOSIS — I10 Essential (primary) hypertension: Secondary | ICD-10-CM | POA: Diagnosis not present

## 2017-03-29 DIAGNOSIS — R1013 Epigastric pain: Secondary | ICD-10-CM | POA: Diagnosis not present

## 2017-03-29 DIAGNOSIS — K430 Incisional hernia with obstruction, without gangrene: Secondary | ICD-10-CM | POA: Diagnosis not present

## 2017-03-29 DIAGNOSIS — I2510 Atherosclerotic heart disease of native coronary artery without angina pectoris: Secondary | ICD-10-CM | POA: Diagnosis not present

## 2017-03-29 NOTE — Telephone Encounter (Signed)
Verbalized to patient that Dr. Agustin Cree does not want patient driving until after abdominal surgery. Patient verbalized understanding. No further questions.

## 2017-03-29 NOTE — Telephone Encounter (Signed)
Got his hernia surgery set up for June but he wants to start back driving tomorrow (and he is going to)

## 2017-03-30 DIAGNOSIS — Z1331 Encounter for screening for depression: Secondary | ICD-10-CM | POA: Diagnosis not present

## 2017-03-30 DIAGNOSIS — E785 Hyperlipidemia, unspecified: Secondary | ICD-10-CM | POA: Diagnosis not present

## 2017-03-30 DIAGNOSIS — Z0000 Encounter for general adult medical examination without abnormal findings: Secondary | ICD-10-CM | POA: Diagnosis not present

## 2017-03-30 DIAGNOSIS — Z125 Encounter for screening for malignant neoplasm of prostate: Secondary | ICD-10-CM | POA: Diagnosis not present

## 2017-03-30 DIAGNOSIS — Z9181 History of falling: Secondary | ICD-10-CM | POA: Diagnosis not present

## 2017-03-30 DIAGNOSIS — J441 Chronic obstructive pulmonary disease with (acute) exacerbation: Secondary | ICD-10-CM | POA: Diagnosis not present

## 2017-03-30 DIAGNOSIS — Z136 Encounter for screening for cardiovascular disorders: Secondary | ICD-10-CM | POA: Diagnosis not present

## 2017-04-06 DIAGNOSIS — J4540 Moderate persistent asthma, uncomplicated: Secondary | ICD-10-CM | POA: Diagnosis not present

## 2017-04-06 DIAGNOSIS — R972 Elevated prostate specific antigen [PSA]: Secondary | ICD-10-CM | POA: Diagnosis not present

## 2017-04-06 DIAGNOSIS — Z79899 Other long term (current) drug therapy: Secondary | ICD-10-CM | POA: Diagnosis not present

## 2017-04-06 DIAGNOSIS — I2510 Atherosclerotic heart disease of native coronary artery without angina pectoris: Secondary | ICD-10-CM | POA: Diagnosis not present

## 2017-04-06 DIAGNOSIS — Z6826 Body mass index (BMI) 26.0-26.9, adult: Secondary | ICD-10-CM | POA: Diagnosis not present

## 2017-04-06 DIAGNOSIS — K439 Ventral hernia without obstruction or gangrene: Secondary | ICD-10-CM | POA: Diagnosis not present

## 2017-04-06 DIAGNOSIS — I480 Paroxysmal atrial fibrillation: Secondary | ICD-10-CM | POA: Diagnosis not present

## 2017-04-06 DIAGNOSIS — E785 Hyperlipidemia, unspecified: Secondary | ICD-10-CM | POA: Diagnosis not present

## 2017-04-18 DIAGNOSIS — I4891 Unspecified atrial fibrillation: Secondary | ICD-10-CM | POA: Diagnosis not present

## 2017-04-18 DIAGNOSIS — K430 Incisional hernia with obstruction, without gangrene: Secondary | ICD-10-CM | POA: Diagnosis not present

## 2017-04-18 DIAGNOSIS — Z01812 Encounter for preprocedural laboratory examination: Secondary | ICD-10-CM | POA: Diagnosis not present

## 2017-04-29 DIAGNOSIS — K430 Incisional hernia with obstruction, without gangrene: Secondary | ICD-10-CM | POA: Diagnosis not present

## 2017-04-30 DIAGNOSIS — J441 Chronic obstructive pulmonary disease with (acute) exacerbation: Secondary | ICD-10-CM | POA: Diagnosis not present

## 2017-05-02 DIAGNOSIS — I10 Essential (primary) hypertension: Secondary | ICD-10-CM | POA: Diagnosis not present

## 2017-05-02 DIAGNOSIS — I2510 Atherosclerotic heart disease of native coronary artery without angina pectoris: Secondary | ICD-10-CM | POA: Diagnosis not present

## 2017-05-02 DIAGNOSIS — K430 Incisional hernia with obstruction, without gangrene: Secondary | ICD-10-CM | POA: Diagnosis not present

## 2017-05-11 DIAGNOSIS — H25813 Combined forms of age-related cataract, bilateral: Secondary | ICD-10-CM | POA: Diagnosis not present

## 2017-05-16 DIAGNOSIS — J45909 Unspecified asthma, uncomplicated: Secondary | ICD-10-CM | POA: Diagnosis not present

## 2017-05-17 DIAGNOSIS — S0003XA Contusion of scalp, initial encounter: Secondary | ICD-10-CM | POA: Diagnosis not present

## 2017-05-17 DIAGNOSIS — S098XXA Other specified injuries of head, initial encounter: Secondary | ICD-10-CM | POA: Diagnosis not present

## 2017-05-18 DIAGNOSIS — R51 Headache: Secondary | ICD-10-CM | POA: Diagnosis not present

## 2017-05-18 DIAGNOSIS — Z8673 Personal history of transient ischemic attack (TIA), and cerebral infarction without residual deficits: Secondary | ICD-10-CM | POA: Diagnosis not present

## 2017-05-18 DIAGNOSIS — S0990XA Unspecified injury of head, initial encounter: Secondary | ICD-10-CM | POA: Diagnosis not present

## 2017-05-18 DIAGNOSIS — S098XXA Other specified injuries of head, initial encounter: Secondary | ICD-10-CM | POA: Diagnosis not present

## 2017-05-23 ENCOUNTER — Telehealth: Payer: Self-pay | Admitting: Cardiology

## 2017-05-23 ENCOUNTER — Ambulatory Visit: Payer: PPO | Admitting: Cardiology

## 2017-05-23 ENCOUNTER — Other Ambulatory Visit: Payer: Self-pay | Admitting: Cardiology

## 2017-05-23 DIAGNOSIS — I129 Hypertensive chronic kidney disease with stage 1 through stage 4 chronic kidney disease, or unspecified chronic kidney disease: Secondary | ICD-10-CM | POA: Diagnosis not present

## 2017-05-23 DIAGNOSIS — R58 Hemorrhage, not elsewhere classified: Secondary | ICD-10-CM | POA: Diagnosis not present

## 2017-05-23 DIAGNOSIS — S4990XA Unspecified injury of shoulder and upper arm, unspecified arm, initial encounter: Secondary | ICD-10-CM | POA: Diagnosis not present

## 2017-05-23 DIAGNOSIS — S51011A Laceration without foreign body of right elbow, initial encounter: Secondary | ICD-10-CM | POA: Diagnosis not present

## 2017-05-23 DIAGNOSIS — J449 Chronic obstructive pulmonary disease, unspecified: Secondary | ICD-10-CM | POA: Diagnosis not present

## 2017-05-23 DIAGNOSIS — N182 Chronic kidney disease, stage 2 (mild): Secondary | ICD-10-CM | POA: Diagnosis not present

## 2017-05-23 DIAGNOSIS — Z79899 Other long term (current) drug therapy: Secondary | ICD-10-CM | POA: Diagnosis not present

## 2017-05-23 DIAGNOSIS — I2510 Atherosclerotic heart disease of native coronary artery without angina pectoris: Secondary | ICD-10-CM | POA: Diagnosis not present

## 2017-05-23 DIAGNOSIS — I252 Old myocardial infarction: Secondary | ICD-10-CM | POA: Diagnosis not present

## 2017-05-23 DIAGNOSIS — E1122 Type 2 diabetes mellitus with diabetic chronic kidney disease: Secondary | ICD-10-CM | POA: Diagnosis not present

## 2017-05-23 DIAGNOSIS — Z7982 Long term (current) use of aspirin: Secondary | ICD-10-CM | POA: Diagnosis not present

## 2017-05-23 NOTE — Telephone Encounter (Signed)
Patient's son, Lonnie Boyd, called with concerns of multiple episodes of syncope in the past few weeks. Patient has fallen 3 times and been to the emergency department twice. Patient fell this morning, cut his forearm, and called EMS when he could not get the bleeding to stop. Patient went to ED, got several stitches and was told to stop his blood thinners (plavix and eliquis) for 3 days. Lonnie Boyd wanted to make Dr. Agustin Cree aware. Patient's son also states that his wife is a PA and thinks metoprolol succinate 50 mg daily is causing patient to pass out. Inquired if patient's blood pressure is being monitored. Lonnie Boyd states they do not have a machine at patient's house. Advised to reschedule appointment for tomorrow morning so we could do further evaluation. Appointment scheduled for 05/24/17 at 11:20. Lonnie Boyd verbalized understanding. No further questions.

## 2017-05-23 NOTE — Telephone Encounter (Signed)
New Message  Pt c/o medication issue:  1. Name of Medication: Xarelto and metoprolol succinate (TOPROL-XL) 50 MG 24 hr tablet  2. How are you currently taking this medication (dosage and times per day)? n/a  3. Are you having a reaction (difficulty breathing--STAT)? no  4. What is your medication issue? Pt's son states that his bp is dropping and he is passing out, he recently fell and hurt his arm so he went to the er and they stopped his xarelto due to the bleeding and they also have questions about his metoprolol. Please call

## 2017-05-24 ENCOUNTER — Ambulatory Visit (INDEPENDENT_AMBULATORY_CARE_PROVIDER_SITE_OTHER): Payer: PPO | Admitting: Cardiology

## 2017-05-24 ENCOUNTER — Encounter: Payer: Self-pay | Admitting: Cardiology

## 2017-05-24 VITALS — BP 116/72 | HR 81 | Ht 68.0 in | Wt 159.1 lb

## 2017-05-24 DIAGNOSIS — I255 Ischemic cardiomyopathy: Secondary | ICD-10-CM | POA: Diagnosis not present

## 2017-05-24 DIAGNOSIS — I480 Paroxysmal atrial fibrillation: Secondary | ICD-10-CM

## 2017-05-24 DIAGNOSIS — R296 Repeated falls: Secondary | ICD-10-CM

## 2017-05-24 DIAGNOSIS — W19XXXA Unspecified fall, initial encounter: Secondary | ICD-10-CM | POA: Insufficient documentation

## 2017-05-24 DIAGNOSIS — I25110 Atherosclerotic heart disease of native coronary artery with unstable angina pectoris: Secondary | ICD-10-CM

## 2017-05-24 DIAGNOSIS — W19XXXS Unspecified fall, sequela: Secondary | ICD-10-CM | POA: Diagnosis not present

## 2017-05-24 HISTORY — DX: Unspecified fall, initial encounter: W19.XXXA

## 2017-05-24 HISTORY — DX: Repeated falls: R29.6

## 2017-05-24 MED ORDER — ISOSORBIDE MONONITRATE ER 30 MG PO TB24: 30.0000 mg | ORAL_TABLET | Freq: Every day | ORAL | 6 refills | Status: DC

## 2017-05-24 MED ORDER — METOPROLOL SUCCINATE ER 25 MG PO TB24: 25.0000 mg | ORAL_TABLET | Freq: Every day | ORAL | 6 refills | Status: DC

## 2017-05-24 NOTE — Progress Notes (Signed)
Cardiology Office Note:    Date:  05/24/2017   ID:  Lonnie Boyd, DOB [birth year], MRN [id number]  PCP:  [name], MD  Cardiologist:  Jenne Campus, MD    Referring MD: [name], MD   Chief Complaint  Patient presents with  . Follow-up  . Fall  He fell down and up going to the emergency room yesterday  History of Present Illness:    Lonnie Boyd is a 77 y.o. male with history of cardiomyopathy which probably was stress-induced.  He had cardiac arrest was resuscitated however improved remarkably with latest estimation of left ventricular ejection fraction showing 45 to 50% clinically doing great without any symptoms.  However he is being experiencing some dizziness and falls during the night initially Ambien has been withdrawn and that helps significantly but still when he gets up in the middle of the night go to the restroom he get dizzy actually yesterday night he fell down and he ended up striking his head and injuring his elbow went to the emergency room sutures were applied to his arm injury.  CAT scan of the head was done no no bleeding.  He is Plavix as well as Xarelto has been called.  He told me that he got those symptoms since that time we increased dose of metoprolol has been taking 25 went up to 50 since that time he gets some symptoms Denies have any chest pain tightness squeezing pressure burning chest very active during the day have no difficulty doing anything during the day.  No chest pain tightness squeezing pressure burning in the chest during the day no dizziness during the day no passing out during the day.  He declined having episode of passing out.  He said he gets dizzy  Past Medical History:  Diagnosis Date  . CAD (coronary artery disease)   . Cardiomyopathy ([place])   . Hyperlipemia   . Ventral hernia     Past Surgical History:  Procedure Laterality Date  . KIDNEY SURGERY    . LEFT HEART CATH AND CORONARY ANGIOGRAPHY N/A 09/20/2016   Procedure: LEFT HEART CATH AND CORONARY ANGIOGRAPHY;  Surgeon: Belva Crome, MD;  Location: [facility];  Service: Cardiovascular;  Laterality: N/A;    Current Medications: Current Meds  Medication Sig  . albuterol (PROVENTIL) (2.5 MG/3ML) 0.083% nebulizer solution USE 1 VIAL VIA NEBULIZER 4 TIMES DAILY as needed for shortness of breath  . atorvastatin (LIPITOR) 40 MG tablet Take 2 tablets (80 mg total) by mouth daily at 6 PM.  . escitalopram (LEXAPRO) 10 MG tablet Take 10 mg by mouth daily.  Marland Kitchen ipratropium (ATROVENT) 0.02 % nebulizer solution Take 1 mL by nebulization as needed.  . isosorbide mononitrate (IMDUR) 60 MG 24 hr tablet Take 1 tablet (60 mg total) by mouth daily.  Marland Kitchen losartan (COZAAR) 50 MG tablet Take 1 tablet (50 mg total) by mouth daily.  . metoprolol succinate (TOPROL-XL) 50 MG 24 hr tablet TAKE ONE TABLET BY MOUTH DAILY  . nitroGLYCERIN (NITROSTAT) 0.4 MG SL tablet Place 1 tablet (0.4 mg total) under the tongue every 5 (five) minutes x 3 doses as needed for chest pain.  . pantoprazole (PROTONIX) 40 MG tablet Take 1 tablet (40 mg total) by mouth 2 (two) times daily.     Allergies:   Patient has no known allergies.   Social History   Socioeconomic History  . Marital status: Married    Spouse name: Not on file  .  Number of children: Not on file  . Years of education: Not on file  . Highest education level: Not on file  Occupational History  . Not on file  Social Needs  . Financial resource strain: Not on file  . Food insecurity:    Worry: Not on file    Inability: Not on file  . Transportation needs:    Medical: Not on file    Non-medical: Not on file  Tobacco Use  . Smoking status: Never Smoker  . Smokeless tobacco: Never Used  Substance and Sexual Activity  . Alcohol use: No  . Drug use: No  . Sexual activity: Not on file  Lifestyle  . Physical activity:    Days per week: Not on file    Minutes per session: Not on file  . Stress: Not on file    Relationships  . Social connections:    Talks on phone: Not on file    Gets together: Not on file    Attends religious service: Not on file    Active member of club or organization: Not on file    Attends meetings of clubs or organizations: Not on file    Relationship status: Not on file  Other Topics Concern  . Not on file  Social History Narrative  . Not on file     Family History: The patient's family history includes Cancer in his father; Heart failure in his father; Hypertension in his father; Leukemia in his sister. ROS:   Please see the history of present illness.    All 14 point review of systems negative except as described per history of present illness  EKGs/Labs/Other Studies Reviewed:    EKG done today shows atrial fibrillation slow ventricular rate left anterior fascicular block poor R wave progression anterior precordium  Recent Labs: 09/20/2016: B Natriuretic Peptide 556.1; TSH 1.879 09/23/2016: Magnesium 1.9 10/04/2016: ALT 21; Hemoglobin 11.6; Platelets 476 02/15/2017: BUN 14; Creatinine, Ser 1.08; Potassium 4.3; Sodium 140  Recent Lipid Panel    Component Value Date/Time   CHOL 81 09/21/2016 0440   TRIG 38 09/21/2016 0440   HDL 34 (L) 09/21/2016 0440   CHOLHDL 2.4 09/21/2016 0440   VLDL 8 09/21/2016 0440   LDLCALC 39 09/21/2016 0440    Physical Exam:    VS:  BP 116/72   Pulse 81   Ht 5\' 8"  (1.727 m)   Wt 159 lb 1.9 oz (72.2 kg)   SpO2 98%   BMI 24.19 kg/m     Wt Readings from Last 3 Encounters:  05/24/17 159 lb 1.9 oz (72.2 kg)  03/24/17 166 lb (75.3 kg)  02/15/17 166 lb (75.3 kg)     GEN:  Well nourished, well developed in no acute distress HEENT: Normal NECK: No JVD; No carotid bruits LYMPHATICS: No lymphadenopathy CARDIAC: Irregularly irregular, no murmurs, no rubs, no gallops RESPIRATORY:  Clear to auscultation without rales, wheezing or rhonchi  ABDOMEN: Soft, non-tender, non-distended MUSCULOSKELETAL:  No edema; No deformity   SKIN: Warm and dry LOWER EXTREMITIES: no swelling NEUROLOGIC:  Alert and oriented x 3 PSYCHIATRIC:  Normal affect   ASSESSMENT:    1. CAD- prior PCIs- cath- moderate CAD 09/22/16   2. PAF (paroxysmal atrial fibrillation) ([place])   3. Ischemic cardiomyopathy   4. Fall, sequela    PLAN:    In order of problems listed above:  1. Coronary artery disease: Stable without any symptoms. 2. Paroxysmal atrial fibrillation: Denies having episodes.  Will hold Eliquis for  now we will have the situation of dizziness sort through.       EKG done today showed atrial fibrillation with slow ventricular rate 3. Hemic cardiomyopathy last ejection fraction 45-50%.  We will continue present management. 4. Fall: It looks like he described orthostatic changes what I will do today I will check his EKG we will also check his orthostatic changes.  I will cut down his metoprolol to 25 daily if he is significantly orthostatic will also cut down his Imdur to 30 mg a day.  Would like to see him back rather quickly within next month or so however we will try to contact him over the phone to see if he still gets some symptoms if that is the case he may require to monitor.  However, his symptoms are less likely related to arrhythmia since it happen only when he gets up.  And only during the night.  The fact that he stop Ambien should help as well however episodes of fall that led to visit to the emergency room will happen when he was not taking Ambien.  EKG performed show atrial fibrillation slow ventricular rate I suspect that is the culprit here.  Hopefully cutting down metoprolol and a half will help with the situation. We will contact him within next week you to see if he feels any better after cutting down the Toprol.  Terms of anticoagulation ask you to start anticoagulation in 2 to 3 days. Medication Adjustments/Labs and Tests Ordered: Current medicines are reviewed at length with the patient today.  Concerns regarding  medicines are outlined above.  No orders of the defined types were placed in this encounter.  Medication changes: No orders of the defined types were placed in this encounter.   Signed, Park Liter, MD, [facility] 05/24/2017 11:25 AM    [place]

## 2017-05-24 NOTE — Patient Instructions (Addendum)
Medication Instructions:  Your physician has recommended you make the following change in your medication:  HOLD eliquis for 2 days and then resume taking 5 mg twice daily DECREASE metoprolol succinate 25 mg daily DECREASE isosorbide mononitrate (imdur) 30 mg daily   Labwork: None  Testing/Procedures: You had an EKG today.   Follow-Up: Your physician recommends that you schedule a follow-up appointment in: 1 month.   Any Other Special Instructions Will Be Listed Below (If Applicable).     If you need a refill on your cardiac medications before your next appointment, please call your pharmacy.

## 2017-05-30 DIAGNOSIS — Z79899 Other long term (current) drug therapy: Secondary | ICD-10-CM | POA: Diagnosis not present

## 2017-05-30 DIAGNOSIS — J441 Chronic obstructive pulmonary disease with (acute) exacerbation: Secondary | ICD-10-CM | POA: Diagnosis not present

## 2017-05-30 DIAGNOSIS — S51011A Laceration without foreign body of right elbow, initial encounter: Secondary | ICD-10-CM | POA: Diagnosis not present

## 2017-06-13 ENCOUNTER — Other Ambulatory Visit: Payer: Self-pay | Admitting: Cardiology

## 2017-06-23 ENCOUNTER — Encounter: Payer: Self-pay | Admitting: Cardiology

## 2017-06-23 ENCOUNTER — Ambulatory Visit (INDEPENDENT_AMBULATORY_CARE_PROVIDER_SITE_OTHER): Payer: PPO | Admitting: Cardiology

## 2017-06-23 ENCOUNTER — Telehealth: Payer: Self-pay | Admitting: *Deleted

## 2017-06-23 VITALS — BP 124/70 | HR 57 | Ht 68.0 in | Wt 162.0 lb

## 2017-06-23 DIAGNOSIS — I25110 Atherosclerotic heart disease of native coronary artery with unstable angina pectoris: Secondary | ICD-10-CM

## 2017-06-23 DIAGNOSIS — I255 Ischemic cardiomyopathy: Secondary | ICD-10-CM | POA: Diagnosis not present

## 2017-06-23 DIAGNOSIS — R42 Dizziness and giddiness: Secondary | ICD-10-CM | POA: Diagnosis not present

## 2017-06-23 DIAGNOSIS — I480 Paroxysmal atrial fibrillation: Secondary | ICD-10-CM | POA: Diagnosis not present

## 2017-06-23 HISTORY — DX: Dizziness and giddiness: R42

## 2017-06-23 NOTE — Telephone Encounter (Signed)
Clarified with patient that he needs to stop taking isosorbide mononitrate (imdur) and he needs to continue taking his plavix. Patient verbalized understanding. No further questions.

## 2017-06-23 NOTE — Telephone Encounter (Signed)
Pt thought Dr. Michela Pitcher to stop Plavix but the AVS says imdur. Please clarify

## 2017-06-23 NOTE — Patient Instructions (Signed)
Medication Instructions:  Your physician has recommended you make the following change in your medication: STOP isosorbide mononitrate (imdur)  Labwork: None  Testing/Procedures: You had an EKG today.   Follow-Up: Your physician recommends that you schedule a follow-up appointment in: 1 month.   If you need a refill on your cardiac medications before your next appointment, please call your pharmacy.   Thank you for choosing [facility]! Robyne Peers, RN [phone]

## 2017-06-23 NOTE — Progress Notes (Signed)
Cardiology Office Note:    Date:  06/23/2017   ID:  Lonnie Boyd, DOB 06-05-1940, MRN [id number]  PCP:  [name], MD  Cardiologist:  Jenne Campus, MD    Referring MD: [name], MD   Chief Complaint  Patient presents with  . 1 month follow up  Doing much better  History of Present Illness:    Lonnie Boyd is a 77 y.o. male with cardiomyopathy thought to be related to stress with significant improvement.  Last echocardiogram from September showed ejection fraction 45 to 50%.  He had episodes what appears to be orthostatic hypotension.  He was taking Ambien that been discontinued he felt much better also cut down the dose of metoprolol as well as Imdur last time and he said since have seen him last time he only had one episode of significant dizziness.  The dizziness always happened the same way it happens in the middle of the night he will get up in the middle of the night because he wants to go and eat something he will start walking he would become dizzy if he sits down or put his head down it gets better.  He said he feels again significantly better after recent changes were made however I will check his orthostatic changes today again we will completely discontinue his Imdur.  He denies having any chest pain tightness squeezing pressure burning chest.  He said he does not have any problem walking moving around during the day.  Past Medical History:  Diagnosis Date  . CAD (coronary artery disease)   . Cardiomyopathy ([place])   . Hyperlipemia   . Ventral hernia     Past Surgical History:  Procedure Laterality Date  . KIDNEY SURGERY    . LEFT HEART CATH AND CORONARY ANGIOGRAPHY N/A 09/20/2016   Procedure: LEFT HEART CATH AND CORONARY ANGIOGRAPHY;  Surgeon: Belva Crome, MD;  Location: [facility];  Service: Cardiovascular;  Laterality: N/A;    Current Medications: Current Meds  Medication Sig  . albuterol (PROVENTIL) (2.5 MG/3ML) 0.083% nebulizer  solution USE 1 VIAL VIA NEBULIZER 4 TIMES DAILY as needed for shortness of breath  . apixaban (ELIQUIS) 5 MG TABS tablet Take 1 tablet (5 mg total) by mouth 2 (two) times daily.  Marland Kitchen atorvastatin (LIPITOR) 40 MG tablet TAKE TWO TABLETS BY MOUTH EVERY DAY AT 6 PM  . clopidogrel (PLAVIX) 75 MG tablet Take 1 tablet (75 mg total) by mouth daily.  Marland Kitchen escitalopram (LEXAPRO) 10 MG tablet Take 10 mg by mouth daily.  Marland Kitchen ipratropium (ATROVENT) 0.02 % nebulizer solution Take 1 mL by nebulization as needed.  . isosorbide mononitrate (IMDUR) 30 MG 24 hr tablet Take 1 tablet (30 mg total) by mouth daily.  Marland Kitchen losartan (COZAAR) 50 MG tablet Take 1 tablet (50 mg total) by mouth daily.  . metoprolol succinate (TOPROL-XL) 25 MG 24 hr tablet Take 1 tablet (25 mg total) by mouth daily. Take with or immediately following a meal.  . nitroGLYCERIN (NITROSTAT) 0.4 MG SL tablet Place 1 tablet (0.4 mg total) under the tongue every 5 (five) minutes x 3 doses as needed for chest pain.  . pantoprazole (PROTONIX) 40 MG tablet Take 1 tablet (40 mg total) by mouth 2 (two) times daily.     Allergies:   Patient has no known allergies.   Social History   Socioeconomic History  . Marital status: Married    Spouse name: Not on file  . Number  of children: Not on file  . Years of education: Not on file  . Highest education level: Not on file  Occupational History  . Not on file  Social Needs  . Financial resource strain: Not on file  . Food insecurity:    Worry: Not on file    Inability: Not on file  . Transportation needs:    Medical: Not on file    Non-medical: Not on file  Tobacco Use  . Smoking status: Never Smoker  . Smokeless tobacco: Never Used  Substance and Sexual Activity  . Alcohol use: No  . Drug use: No  . Sexual activity: Not on file  Lifestyle  . Physical activity:    Days per week: Not on file    Minutes per session: Not on file  . Stress: Not on file  Relationships  . Social connections:    Talks  on phone: Not on file    Gets together: Not on file    Attends religious service: Not on file    Active member of club or organization: Not on file    Attends meetings of clubs or organizations: Not on file    Relationship status: Not on file  Other Topics Concern  . Not on file  Social History Narrative  . Not on file     Family History: The patient's family history includes Cancer in his father; Heart failure in his father; Hypertension in his father; Leukemia in his sister. ROS:   Please see the history of present illness.    All 14 point review of systems negative except as described per history of present illness  EKGs/Labs/Other Studies Reviewed:      Recent Labs: 09/20/2016: B Natriuretic Peptide 556.1; TSH 1.879 09/23/2016: Magnesium 1.9 10/04/2016: ALT 21; Hemoglobin 11.6; Platelets 476 02/15/2017: BUN 14; Creatinine, Ser 1.08; Potassium 4.3; Sodium 140  Recent Lipid Panel    Component Value Date/Time   CHOL 81 09/21/2016 0440   TRIG 38 09/21/2016 0440   HDL 34 (L) 09/21/2016 0440   CHOLHDL 2.4 09/21/2016 0440   VLDL 8 09/21/2016 0440   LDLCALC 39 09/21/2016 0440    Physical Exam:    VS:  BP 124/70   Pulse (!) 57   Ht 5\' 8"  (1.727 m)   Wt 162 lb (73.5 kg)   SpO2 97%   BMI 24.63 kg/m     Wt Readings from Last 3 Encounters:  06/23/17 162 lb (73.5 kg)  05/24/17 159 lb 1.9 oz (72.2 kg)  03/24/17 166 lb (75.3 kg)     GEN:  Well nourished, well developed in no acute distress HEENT: Normal NECK: No JVD; No carotid bruits LYMPHATICS: No lymphadenopathy CARDIAC: RRR, no murmurs, no rubs, no gallops RESPIRATORY:  Clear to auscultation without rales, wheezing or rhonchi  ABDOMEN: Soft, non-tender, non-distended MUSCULOSKELETAL:  No edema; No deformity  SKIN: Warm and dry LOWER EXTREMITIES: no swelling NEUROLOGIC:  Alert and oriented x 3 PSYCHIATRIC:  Normal affect   ASSESSMENT:    1. Ischemic cardiomyopathy   2. PAF (paroxysmal atrial fibrillation)  ([place])   3. CAD- prior PCIs- cath- moderate CAD 09/22/16   4. Dizziness    PLAN:    In order of problems listed above:  1. Ischemic cardiomyopathy last ejection fraction 45 to 50% on appropriate medication which I will continue. 2. Paroxysmal atrial fibrillation.  We will do his EKG today  EKG done today revealed atrial fibrillation with controlled ventricular rate.  He is not anticoagulated  for now because of falls that he struggles from during the night.  Hopefully discontinuation of Imdur will prevent those falls from happening will be able to restart his anticoagulation and then consider cardioversion. 3. Coronary artery disease: Stable we will continue present management 4. Dizziness: Will discontinue his Imdur.  See him back in 1 month or sooner if he has a problem   Medication Adjustments/Labs and Tests Ordered: Current medicines are reviewed at length with the patient today.  Concerns regarding medicines are outlined above.  No orders of the defined types were placed in this encounter.  Medication changes: No orders of the defined types were placed in this encounter.   Signed, Park Liter, MD, [facility] 06/23/2017 11:11 AM    [place]

## 2017-06-30 DIAGNOSIS — J441 Chronic obstructive pulmonary disease with (acute) exacerbation: Secondary | ICD-10-CM | POA: Diagnosis not present

## 2017-07-01 DIAGNOSIS — H25812 Combined forms of age-related cataract, left eye: Secondary | ICD-10-CM | POA: Diagnosis not present

## 2017-07-19 DIAGNOSIS — J45909 Unspecified asthma, uncomplicated: Secondary | ICD-10-CM | POA: Diagnosis not present

## 2017-07-22 DIAGNOSIS — H25812 Combined forms of age-related cataract, left eye: Secondary | ICD-10-CM | POA: Diagnosis not present

## 2017-07-22 DIAGNOSIS — Z01818 Encounter for other preprocedural examination: Secondary | ICD-10-CM | POA: Diagnosis not present

## 2017-07-25 ENCOUNTER — Ambulatory Visit (INDEPENDENT_AMBULATORY_CARE_PROVIDER_SITE_OTHER): Payer: PPO | Admitting: Cardiology

## 2017-07-25 ENCOUNTER — Encounter: Payer: Self-pay | Admitting: Cardiology

## 2017-07-25 VITALS — BP 126/72 | HR 69 | Ht 68.0 in | Wt 165.0 lb

## 2017-07-25 DIAGNOSIS — I25110 Atherosclerotic heart disease of native coronary artery with unstable angina pectoris: Secondary | ICD-10-CM | POA: Diagnosis not present

## 2017-07-25 DIAGNOSIS — I480 Paroxysmal atrial fibrillation: Secondary | ICD-10-CM

## 2017-07-25 DIAGNOSIS — I472 Ventricular tachycardia: Secondary | ICD-10-CM

## 2017-07-25 DIAGNOSIS — R42 Dizziness and giddiness: Secondary | ICD-10-CM

## 2017-07-25 DIAGNOSIS — W19XXXS Unspecified fall, sequela: Secondary | ICD-10-CM | POA: Diagnosis not present

## 2017-07-25 DIAGNOSIS — I4721 Torsades de pointes: Secondary | ICD-10-CM

## 2017-07-25 NOTE — Patient Instructions (Signed)
Medication Instructions:  Your physician recommends that you continue on your current medications as directed. Please refer to the Current Medication list given to you today.  Labwork: None  Testing/Procedures: None  Follow-Up: Your physician recommends that you schedule a follow-up appointment in: 1 month  Any Other Special Instructions Will Be Listed Below (If Applicable).     If you need a refill on your cardiac medications before your next appointment, please call your pharmacy.   [facility]  Que Meneely A, RN, BSN  

## 2017-07-25 NOTE — Progress Notes (Signed)
Cardiology Office Note:    Date:  07/25/2017   ID:  Lonnie Boyd, DOB February 25, 1940, MRN [id number]  PCP:  [name], MD  Cardiologist:  Jenne Campus, MD    Referring MD: [name], MD   No chief complaint on file. Doing well with coronary artery disease status post cardiac arrest  History of Present Illness:    Lonnie Boyd is a 77 y.o. male with resuscitation complete recovery ejection fraction improved doing well asymptomatic no chest pain tightness squeezing pressure burning chest.  Denies having any palpitations overall feeling great  Past Medical History:  Diagnosis Date  . CAD (coronary artery disease)   . Cardiomyopathy ([place])   . Hyperlipemia   . Ventral hernia     Past Surgical History:  Procedure Laterality Date  . KIDNEY SURGERY    . LEFT HEART CATH AND CORONARY ANGIOGRAPHY N/A 09/20/2016   Procedure: LEFT HEART CATH AND CORONARY ANGIOGRAPHY;  Surgeon: Belva Crome, MD;  Location: [facility];  Service: Cardiovascular;  Laterality: N/A;    Current Medications: Current Meds  Medication Sig  . albuterol (PROVENTIL) (2.5 MG/3ML) 0.083% nebulizer solution USE 1 VIAL VIA NEBULIZER 4 TIMES DAILY as needed for shortness of breath  . ALPRAZolam (XANAX) 0.25 MG tablet Take 0.25 mg by mouth 2 (two) times daily as needed. for anxiety  . apixaban (ELIQUIS) 5 MG TABS tablet Take 1 tablet (5 mg total) by mouth 2 (two) times daily.  Marland Kitchen atorvastatin (LIPITOR) 40 MG tablet TAKE TWO TABLETS BY MOUTH EVERY DAY AT 6 PM  . clopidogrel (PLAVIX) 75 MG tablet Take 1 tablet (75 mg total) by mouth daily.  Marland Kitchen escitalopram (LEXAPRO) 10 MG tablet Take 10 mg by mouth daily.  Marland Kitchen losartan (COZAAR) 50 MG tablet Take 1 tablet (50 mg total) by mouth daily.  . metoprolol succinate (TOPROL-XL) 25 MG 24 hr tablet Take 1 tablet (25 mg total) by mouth daily. Take with or immediately following a meal.  . nitroGLYCERIN (NITROSTAT) 0.4 MG SL tablet Place 1 tablet (0.4 mg total)  under the tongue every 5 (five) minutes x 3 doses as needed for chest pain.  . pantoprazole (PROTONIX) 40 MG tablet Take 1 tablet (40 mg total) by mouth 2 (two) times daily.     Allergies:   Patient has no known allergies.   Social History   Socioeconomic History  . Marital status: Married    Spouse name: Not on file  . Number of children: Not on file  . Years of education: Not on file  . Highest education level: Not on file  Occupational History  . Not on file  Social Needs  . Financial resource strain: Not on file  . Food insecurity:    Worry: Not on file    Inability: Not on file  . Transportation needs:    Medical: Not on file    Non-medical: Not on file  Tobacco Use  . Smoking status: Never Smoker  . Smokeless tobacco: Never Used  Substance and Sexual Activity  . Alcohol use: No  . Drug use: No  . Sexual activity: Not on file  Lifestyle  . Physical activity:    Days per week: Not on file    Minutes per session: Not on file  . Stress: Not on file  Relationships  . Social connections:    Talks on phone: Not on file    Gets together: Not on file    Attends religious service:  Not on file    Active member of club or organization: Not on file    Attends meetings of clubs or organizations: Not on file    Relationship status: Not on file  Other Topics Concern  . Not on file  Social History Narrative  . Not on file     Family History: The patient's family history includes Cancer in his father; Heart failure in his father; Hypertension in his father; Leukemia in his sister. ROS:   Please see the history of present illness.    All 14 point review of systems negative except as described per history of present illness  EKGs/Labs/Other Studies Reviewed:      Recent Labs: 09/20/2016: B Natriuretic Peptide 556.1; TSH 1.879 09/23/2016: Magnesium 1.9 10/04/2016: ALT 21; Hemoglobin 11.6; Platelets 476 02/15/2017: BUN 14; Creatinine, Ser 1.08; Potassium 4.3; Sodium 140    Recent Lipid Panel    Component Value Date/Time   CHOL 81 09/21/2016 0440   TRIG 38 09/21/2016 0440   HDL 34 (L) 09/21/2016 0440   CHOLHDL 2.4 09/21/2016 0440   VLDL 8 09/21/2016 0440   LDLCALC 39 09/21/2016 0440    Physical Exam:    VS:  BP 126/72 (BP Location: Right Arm, Patient Position: Sitting, Cuff Size: Normal)   Pulse 69   Ht 5\' 8"  (1.727 m)   Wt 165 lb (74.8 kg)   SpO2 93%   BMI 25.09 kg/m     Wt Readings from Last 3 Encounters:  07/25/17 165 lb (74.8 kg)  06/23/17 162 lb (73.5 kg)  05/24/17 159 lb 1.9 oz (72.2 kg)     GEN:  Well nourished, well developed in no acute distress HEENT: Normal NECK: No JVD; No carotid bruits LYMPHATICS: No lymphadenopathy CARDIAC: RRR, no murmurs, no rubs, no gallops RESPIRATORY:  Clear to auscultation without rales, wheezing or rhonchi  ABDOMEN: Soft, non-tender, non-distended MUSCULOSKELETAL:  No edema; No deformity  SKIN: Warm and dry LOWER EXTREMITIES: no swelling NEUROLOGIC:  Alert and oriented x 3 PSYCHIATRIC:  Normal affect   ASSESSMENT:    1. CAD- prior PCIs- cath- moderate CAD 09/22/16   2. 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 Fall, sequela   5. Dizziness    PLAN:    In order of problems listed above:  1. Coronary artery disease doing well asymptomatic and appropriate medications which I will continue 2. Paroxysmal atrial fibrillation now probably persistent.  We will do EKG to confirm the rhythm start talking to him about cardioversion he is not eager to do that he is anticoagulated which I will continue no recent falls. 3. Torsades in face of acute microinfarction does not carry prognostic significance. 4. Falls after discontinuation of Imdur were normal we will continue present management.  Dizziness denies having any   Medication Adjustments/Labs and Tests Ordered: Current medicines are reviewed at length with the patient today.  Concerns regarding medicines are  outlined above.  No orders of the defined types were placed in this encounter.  Medication changes: No orders of the defined types were placed in this encounter.   Signed, Park Liter, MD, [facility] 07/25/2017 11:21 AM    [place]

## 2017-07-30 DIAGNOSIS — J441 Chronic obstructive pulmonary disease with (acute) exacerbation: Secondary | ICD-10-CM | POA: Diagnosis not present

## 2017-08-02 DIAGNOSIS — H259 Unspecified age-related cataract: Secondary | ICD-10-CM | POA: Diagnosis not present

## 2017-08-02 DIAGNOSIS — Z7982 Long term (current) use of aspirin: Secondary | ICD-10-CM | POA: Diagnosis not present

## 2017-08-02 DIAGNOSIS — Z7901 Long term (current) use of anticoagulants: Secondary | ICD-10-CM | POA: Diagnosis not present

## 2017-08-02 DIAGNOSIS — N183 Chronic kidney disease, stage 3 (moderate): Secondary | ICD-10-CM | POA: Diagnosis not present

## 2017-08-02 DIAGNOSIS — Z955 Presence of coronary angioplasty implant and graft: Secondary | ICD-10-CM | POA: Diagnosis not present

## 2017-08-02 DIAGNOSIS — I252 Old myocardial infarction: Secondary | ICD-10-CM | POA: Diagnosis not present

## 2017-08-02 DIAGNOSIS — I129 Hypertensive chronic kidney disease with stage 1 through stage 4 chronic kidney disease, or unspecified chronic kidney disease: Secondary | ICD-10-CM | POA: Diagnosis not present

## 2017-08-02 DIAGNOSIS — Z79899 Other long term (current) drug therapy: Secondary | ICD-10-CM | POA: Diagnosis not present

## 2017-08-02 DIAGNOSIS — E785 Hyperlipidemia, unspecified: Secondary | ICD-10-CM | POA: Diagnosis not present

## 2017-08-02 DIAGNOSIS — H25812 Combined forms of age-related cataract, left eye: Secondary | ICD-10-CM | POA: Diagnosis not present

## 2017-08-19 ENCOUNTER — Other Ambulatory Visit: Payer: Self-pay | Admitting: Cardiology

## 2017-08-24 DIAGNOSIS — C44329 Squamous cell carcinoma of skin of other parts of face: Secondary | ICD-10-CM | POA: Diagnosis not present

## 2017-08-30 DIAGNOSIS — J441 Chronic obstructive pulmonary disease with (acute) exacerbation: Secondary | ICD-10-CM | POA: Diagnosis not present

## 2017-09-06 ENCOUNTER — Ambulatory Visit: Payer: PPO | Admitting: Cardiology

## 2017-09-06 DIAGNOSIS — Z7902 Long term (current) use of antithrombotics/antiplatelets: Secondary | ICD-10-CM | POA: Diagnosis not present

## 2017-09-06 DIAGNOSIS — K449 Diaphragmatic hernia without obstruction or gangrene: Secondary | ICD-10-CM | POA: Diagnosis not present

## 2017-09-06 DIAGNOSIS — H25811 Combined forms of age-related cataract, right eye: Secondary | ICD-10-CM | POA: Diagnosis not present

## 2017-09-06 DIAGNOSIS — N182 Chronic kidney disease, stage 2 (mild): Secondary | ICD-10-CM | POA: Diagnosis not present

## 2017-09-06 DIAGNOSIS — Z7901 Long term (current) use of anticoagulants: Secondary | ICD-10-CM | POA: Diagnosis not present

## 2017-09-06 DIAGNOSIS — J45909 Unspecified asthma, uncomplicated: Secondary | ICD-10-CM | POA: Diagnosis not present

## 2017-09-06 DIAGNOSIS — E785 Hyperlipidemia, unspecified: Secondary | ICD-10-CM | POA: Diagnosis not present

## 2017-09-06 DIAGNOSIS — I129 Hypertensive chronic kidney disease with stage 1 through stage 4 chronic kidney disease, or unspecified chronic kidney disease: Secondary | ICD-10-CM | POA: Diagnosis not present

## 2017-09-06 DIAGNOSIS — H259 Unspecified age-related cataract: Secondary | ICD-10-CM | POA: Diagnosis not present

## 2017-09-06 DIAGNOSIS — K219 Gastro-esophageal reflux disease without esophagitis: Secondary | ICD-10-CM | POA: Diagnosis not present

## 2017-09-06 DIAGNOSIS — Z79899 Other long term (current) drug therapy: Secondary | ICD-10-CM | POA: Diagnosis not present

## 2017-09-06 DIAGNOSIS — Z8553 Personal history of malignant neoplasm of renal pelvis: Secondary | ICD-10-CM | POA: Diagnosis not present

## 2017-09-06 DIAGNOSIS — I2510 Atherosclerotic heart disease of native coronary artery without angina pectoris: Secondary | ICD-10-CM | POA: Diagnosis not present

## 2017-09-06 DIAGNOSIS — H52223 Regular astigmatism, bilateral: Secondary | ICD-10-CM | POA: Diagnosis not present

## 2017-09-06 DIAGNOSIS — Z7951 Long term (current) use of inhaled steroids: Secondary | ICD-10-CM | POA: Diagnosis not present

## 2017-09-06 DIAGNOSIS — I480 Paroxysmal atrial fibrillation: Secondary | ICD-10-CM | POA: Diagnosis not present

## 2017-09-06 DIAGNOSIS — I252 Old myocardial infarction: Secondary | ICD-10-CM | POA: Diagnosis not present

## 2017-09-09 ENCOUNTER — Ambulatory Visit: Payer: PPO | Admitting: Cardiology

## 2017-09-14 DIAGNOSIS — R21 Rash and other nonspecific skin eruption: Secondary | ICD-10-CM | POA: Diagnosis not present

## 2017-09-27 ENCOUNTER — Other Ambulatory Visit: Payer: Self-pay | Admitting: Cardiovascular Disease

## 2017-09-27 NOTE — Telephone Encounter (Signed)
Rx request sent to pharmacy.  

## 2017-09-30 DIAGNOSIS — J441 Chronic obstructive pulmonary disease with (acute) exacerbation: Secondary | ICD-10-CM | POA: Diagnosis not present

## 2017-10-10 DIAGNOSIS — Z79899 Other long term (current) drug therapy: Secondary | ICD-10-CM | POA: Diagnosis not present

## 2017-10-10 DIAGNOSIS — Z139 Encounter for screening, unspecified: Secondary | ICD-10-CM | POA: Diagnosis not present

## 2017-10-10 DIAGNOSIS — I480 Paroxysmal atrial fibrillation: Secondary | ICD-10-CM | POA: Diagnosis not present

## 2017-10-10 DIAGNOSIS — E785 Hyperlipidemia, unspecified: Secondary | ICD-10-CM | POA: Diagnosis not present

## 2017-10-10 DIAGNOSIS — R972 Elevated prostate specific antigen [PSA]: Secondary | ICD-10-CM | POA: Diagnosis not present

## 2017-10-10 DIAGNOSIS — I2510 Atherosclerotic heart disease of native coronary artery without angina pectoris: Secondary | ICD-10-CM | POA: Diagnosis not present

## 2017-10-10 DIAGNOSIS — Z1339 Encounter for screening examination for other mental health and behavioral disorders: Secondary | ICD-10-CM | POA: Diagnosis not present

## 2017-10-10 DIAGNOSIS — Z6827 Body mass index (BMI) 27.0-27.9, adult: Secondary | ICD-10-CM | POA: Diagnosis not present

## 2017-10-10 DIAGNOSIS — J4540 Moderate persistent asthma, uncomplicated: Secondary | ICD-10-CM | POA: Diagnosis not present

## 2017-10-26 ENCOUNTER — Encounter: Payer: Self-pay | Admitting: Cardiology

## 2017-10-26 ENCOUNTER — Ambulatory Visit (INDEPENDENT_AMBULATORY_CARE_PROVIDER_SITE_OTHER): Payer: PPO | Admitting: Cardiology

## 2017-10-26 VITALS — BP 122/68 | HR 74 | Ht 68.0 in | Wt 172.0 lb

## 2017-10-26 DIAGNOSIS — I25110 Atherosclerotic heart disease of native coronary artery with unstable angina pectoris: Secondary | ICD-10-CM

## 2017-10-26 DIAGNOSIS — I472 Ventricular tachycardia: Secondary | ICD-10-CM | POA: Diagnosis not present

## 2017-10-26 DIAGNOSIS — I4721 Torsades de pointes: Secondary | ICD-10-CM

## 2017-10-26 DIAGNOSIS — I480 Paroxysmal atrial fibrillation: Secondary | ICD-10-CM

## 2017-10-26 DIAGNOSIS — I255 Ischemic cardiomyopathy: Secondary | ICD-10-CM

## 2017-10-26 NOTE — Progress Notes (Signed)
Cardiology Office Note:    Date:  10/26/2017   ID:  Lonnie Boyd, DOB 1940-05-22, MRN [id number]  PCP:  [name], MD  Cardiologist:  Jenne Campus, MD    Referring MD: [name], MD   Chief Complaint  Patient presents with  . 1 month follow up  Doing great  History of Present Illness:    Lonnie Boyd is a 77 y.o. male with quite amazing past medical history.  He did have coronary artery disease status post myocardial infarction CPR however he recovered completely from that he also got significant cardiomyopathy with ejection fraction 25%.  Now he comes with episode of atrial fibrillation.  His ejection fraction is normal we had a long discussion today about what to do with the situation he feels perfectly fine I will do EKG today to check the rhythm he is anticoagulated I will discontinue his Plavix.  Concern is about taking Plavix and Eliquis together.  Past Medical History:  Diagnosis Date  . CAD (coronary artery disease)   . Cardiomyopathy ([place])   . Hyperlipemia   . Ventral hernia     Past Surgical History:  Procedure Laterality Date  . KIDNEY SURGERY    . LEFT HEART CATH AND CORONARY ANGIOGRAPHY N/A 09/20/2016   Procedure: LEFT HEART CATH AND CORONARY ANGIOGRAPHY;  Surgeon: Belva Crome, MD;  Location: [facility];  Service: Cardiovascular;  Laterality: N/A;    Current Medications: Current Meds  Medication Sig  . albuterol (PROVENTIL) (2.5 MG/3ML) 0.083% nebulizer solution USE 1 VIAL VIA NEBULIZER 4 TIMES DAILY as needed for shortness of breath  . ALPRAZolam (XANAX) 0.25 MG tablet Take 0.25 mg by mouth 2 (two) times daily as needed. for anxiety  . apixaban (ELIQUIS) 5 MG TABS tablet Take 1 tablet (5 mg total) by mouth 2 (two) times daily.  Marland Kitchen atorvastatin (LIPITOR) 40 MG tablet TAKE TWO TABLETS BY MOUTH EVERY DAY AT 6 PM  . clopidogrel (PLAVIX) 75 MG tablet Take 1 tablet (75 mg total) by mouth daily.  Marland Kitchen escitalopram (LEXAPRO) 10 MG  tablet Take 10 mg by mouth daily.  Marland Kitchen losartan (COZAAR) 50 MG tablet TAKE ONE TABLET BY MOUTH DAILY  . metoprolol succinate (TOPROL-XL) 25 MG 24 hr tablet Take 1 tablet (25 mg total) by mouth daily. Take with or immediately following a meal.  . nitroGLYCERIN (NITROSTAT) 0.4 MG SL tablet Place 1 tablet (0.4 mg total) under the tongue every 5 (five) minutes x 3 doses as needed for chest pain.  . pantoprazole (PROTONIX) 40 MG tablet TAKE ONE TABLET BY MOUTH TWICE DAILY     Allergies:   Patient has no known allergies.   Social History   Socioeconomic History  . Marital status: Married    Spouse name: Not on file  . Number of children: Not on file  . Years of education: Not on file  . Highest education level: Not on file  Occupational History  . Not on file  Social Needs  . Financial resource strain: Not on file  . Food insecurity:    Worry: Not on file    Inability: Not on file  . Transportation needs:    Medical: Not on file    Non-medical: Not on file  Tobacco Use  . Smoking status: Never Smoker  . Smokeless tobacco: Never Used  Substance and Sexual Activity  . Alcohol use: No  . Drug use: No  . Sexual activity: Not on file  Lifestyle  .  Physical activity:    Days per week: Not on file    Minutes per session: Not on file  . Stress: Not on file  Relationships  . Social connections:    Talks on phone: Not on file    Gets together: Not on file    Attends religious service: Not on file    Active member of club or organization: Not on file    Attends meetings of clubs or organizations: Not on file    Relationship status: Not on file  Other Topics Concern  . Not on file  Social History Narrative  . Not on file     Family History: The patient's family history includes Cancer in his father; Heart failure in his father; Hypertension in his father; Leukemia in his sister. ROS:   Please see the history of present illness.    All 14 point review of systems negative except as  described per history of present illness  EKGs/Labs/Other Studies Reviewed:      Recent Labs: 02/15/2017: BUN 14; Creatinine, Ser 1.08; Potassium 4.3; Sodium 140  Recent Lipid Panel    Component Value Date/Time   CHOL 81 09/21/2016 0440   TRIG 38 09/21/2016 0440   HDL 34 (L) 09/21/2016 0440   CHOLHDL 2.4 09/21/2016 0440   VLDL 8 09/21/2016 0440   LDLCALC 39 09/21/2016 0440    Physical Exam:    VS:  BP 122/68   Pulse 74   Ht 5\' 8"  (1.727 m)   Wt 172 lb (78 kg)   SpO2 96%   BMI 26.15 kg/m     Wt Readings from Last 3 Encounters:  10/26/17 172 lb (78 kg)  07/25/17 165 lb (74.8 kg)  06/23/17 162 lb (73.5 kg)     GEN:  Well nourished, well developed in no acute distress HEENT: Normal NECK: No JVD; No carotid bruits LYMPHATICS: No lymphadenopathy CARDIAC: Irregular, no murmurs, no rubs, no gallops RESPIRATORY:  Clear to auscultation without rales, wheezing or rhonchi  ABDOMEN: Soft, non-tender, non-distended MUSCULOSKELETAL:  No edema; No deformity  SKIN: Warm and dry LOWER EXTREMITIES: no swelling NEUROLOGIC:  Alert and oriented x 3 PSYCHIATRIC:  Normal affect   ASSESSMENT:    1. CAD- prior PCIs- cath- moderate CAD 09/22/16   2. Ischemic cardiomyopathy   3. Torsades de pointes ([place])   4. PAF (paroxysmal atrial fibrillation) (HCC)    PLAN:    In order of problems listed above:  1. Coronary artery disease.  Stable on appropriate medication more than a year after last cardiac catheterization with moderate disease.  No intervention needed. 2. Ischemic cardia myopathy with normalization doing well from that point of view. 3. Paroxysmal atrial fibrillation.  We had a discussion about potential cardioversion he is not interested.  Therefore we will continue present medications.  I will do EKG to check the rhythm.  I will discontinue his Plavix.   Medication Adjustments/Labs and Tests Ordered: Current medicines are reviewed at length with the patient today.  Concerns  regarding medicines are outlined above.  No orders of the defined types were placed in this encounter.  Medication changes: No orders of the defined types were placed in this encounter.   Signed, Park Liter, MD, [facility] 10/26/2017 3:05 PM    [place]

## 2017-10-26 NOTE — Patient Instructions (Signed)
Medication Instructions:  Your physician has recommended you make the following change in your medication:  STOP: Plavix    Labwork: None.  Testing/Procedures: None.  Follow-Up: Your physician wants you to follow-up in: 3 months.  You will receive a reminder letter in the mail two months in advance. If you don't receive a letter, please call our office to schedule the follow-up appointment.   Any Other Special Instructions Will Be Listed Below (If Applicable).     If you need a refill on your cardiac medications before your next appointment, please call your pharmacy.

## 2017-10-30 DIAGNOSIS — J441 Chronic obstructive pulmonary disease with (acute) exacerbation: Secondary | ICD-10-CM | POA: Diagnosis not present

## 2017-11-19 DIAGNOSIS — J029 Acute pharyngitis, unspecified: Secondary | ICD-10-CM | POA: Diagnosis not present

## 2017-11-24 ENCOUNTER — Other Ambulatory Visit: Payer: Self-pay | Admitting: Cardiology

## 2017-11-30 DIAGNOSIS — J441 Chronic obstructive pulmonary disease with (acute) exacerbation: Secondary | ICD-10-CM | POA: Diagnosis not present

## 2017-12-17 ENCOUNTER — Other Ambulatory Visit: Payer: Self-pay | Admitting: Cardiology

## 2017-12-30 DIAGNOSIS — J441 Chronic obstructive pulmonary disease with (acute) exacerbation: Secondary | ICD-10-CM | POA: Diagnosis not present

## 2018-01-09 DIAGNOSIS — J45909 Unspecified asthma, uncomplicated: Secondary | ICD-10-CM | POA: Diagnosis not present

## 2018-01-09 DIAGNOSIS — H6122 Impacted cerumen, left ear: Secondary | ICD-10-CM | POA: Diagnosis not present

## 2018-01-20 DIAGNOSIS — J029 Acute pharyngitis, unspecified: Secondary | ICD-10-CM | POA: Diagnosis not present

## 2018-01-20 DIAGNOSIS — Z6827 Body mass index (BMI) 27.0-27.9, adult: Secondary | ICD-10-CM | POA: Diagnosis not present

## 2018-01-24 ENCOUNTER — Ambulatory Visit (INDEPENDENT_AMBULATORY_CARE_PROVIDER_SITE_OTHER): Payer: PPO | Admitting: Cardiology

## 2018-01-24 ENCOUNTER — Encounter: Payer: Self-pay | Admitting: Cardiology

## 2018-01-24 VITALS — BP 140/82 | HR 68 | Resp 14 | Ht 68.0 in | Wt 166.8 lb

## 2018-01-24 DIAGNOSIS — I469 Cardiac arrest, cause unspecified: Secondary | ICD-10-CM | POA: Diagnosis not present

## 2018-01-24 DIAGNOSIS — I25110 Atherosclerotic heart disease of native coronary artery with unstable angina pectoris: Secondary | ICD-10-CM | POA: Diagnosis not present

## 2018-01-24 DIAGNOSIS — I4901 Ventricular fibrillation: Secondary | ICD-10-CM

## 2018-01-24 DIAGNOSIS — R0789 Other chest pain: Secondary | ICD-10-CM | POA: Insufficient documentation

## 2018-01-24 DIAGNOSIS — I255 Ischemic cardiomyopathy: Secondary | ICD-10-CM | POA: Diagnosis not present

## 2018-01-24 DIAGNOSIS — I480 Paroxysmal atrial fibrillation: Secondary | ICD-10-CM | POA: Diagnosis not present

## 2018-01-24 NOTE — Patient Instructions (Signed)
Medication Instructions:  Your physician recommends that you continue on your current medications as directed. Please refer to the Current Medication list given to you today.  If you need a refill on your cardiac medications before your next appointment, please call your pharmacy.   Lab work: None.  If you have labs (blood work) drawn today and your tests are completely normal, you will receive your results only by: Marland Kitchen MyChart Message (if you have MyChart) OR . A paper copy in the mail If you have any lab test that is abnormal or we need to change your treatment, we will call you to review the results.  Testing/Procedures: Your physician has requested that you have an echocardiogram. Echocardiography is a painless test that uses sound waves to create images of your heart. It provides your doctor with information about the size and shape of your heart and how well your heart's chambers and valves are working. This procedure takes approximately one hour. There are no restrictions for this procedure.  Your physician has requested that you have a lexiscan myoview. For further information please visit [email or web address]. Please follow instruction sheet, as given.    Follow-Up: At [facility], you and your health needs are our priority.  As part of our continuing mission to provide you with exceptional heart care, we have created designated Provider Care Teams.  These Care Teams include your primary Cardiologist (physician) and Advanced Practice Providers (APPs -  Physician Assistants and Nurse Practitioners) who all work together to provide you with the care you need, when you need it. You will need a follow up appointment in 5 months.  Please call our office 2 months in advance to schedule this appointment.  You may see Jenne Campus, MD or another member of our [facility] Provider Team in [place]: Shirlee More, MD . Jyl Heinz, MD  Any Other Special Instructions Will Be Listed  Below (If Applicable).    Echocardiogram An echocardiogram is a procedure that uses painless sound waves (ultrasound) to produce an image of the heart. Images from an echocardiogram can provide important information about:  Signs of coronary artery disease (CAD).  Aneurysm detection. An aneurysm is a weak or damaged part of an artery wall that bulges out from the normal force of blood pumping through the body.  Heart size and shape. Changes in the size or shape of the heart can be associated with certain conditions, including heart failure, aneurysm, and CAD.  Heart muscle function.  Heart valve function.  Signs of a past heart attack.  Fluid buildup around the heart.  Thickening of the heart muscle.  A tumor or infectious growth around the heart valves. Tell a health care provider about:  Any allergies you have.  All medicines you are taking, including vitamins, herbs, eye drops, creams, and over-the-counter medicines.  Any blood disorders you have.  Any surgeries you have had.  Any medical conditions you have.  Whether you are pregnant or may be pregnant. What are the risks? Generally, this is a safe procedure. However, problems may occur, including:  Allergic reaction to dye (contrast) that may be used during the procedure. What happens before the procedure? No specific preparation is needed. You may eat and drink normally. What happens during the procedure?   An IV tube may be inserted into one of your veins.  You may receive contrast through this tube. A contrast is an injection that improves the quality of the pictures from your heart.  A gel will  be applied to your chest.  A wand-like tool (transducer) will be moved over your chest. The gel will help to transmit the sound waves from the transducer.  The sound waves will harmlessly bounce off of your heart to allow the heart images to be captured in real-time motion. The images will be recorded on a  computer. The procedure may vary among health care providers and hospitals. What happens after the procedure?  You may return to your normal, everyday life, including diet, activities, and medicines, unless your health care provider tells you not to do that. Summary  An echocardiogram is a procedure that uses painless sound waves (ultrasound) to produce an image of the heart.  Images from an echocardiogram can provide important information about the size and shape of your heart, heart muscle function, heart valve function, and fluid buildup around your heart.  You do not need to do anything to prepare before this procedure. You may eat and drink normally.  After the echocardiogram is completed, you may return to your normal, everyday life, unless your health care provider tells you not to do that. This information is not intended to replace advice given to you by your health care provider. Make sure you discuss any questions you have with your health care provider. Document Released: 01/09/2000 Document Revised: 02/14/2016 Document Reviewed: 02/14/2016 Elsevier Interactive Patient Education  2019 [place].   Cardiac Nuclear Scan A cardiac nuclear scan is a test that measures blood flow to the heart when a person is resting and when he or she is exercising. The test looks for problems such as:  Not enough blood reaching a portion of the heart.  The heart muscle not working normally. You may need this test if:  You have heart disease.  You have had abnormal lab results.  You have had heart surgery or a balloon procedure to open up blocked arteries (angioplasty).  You have chest pain.  You have shortness of breath. In this test, a radioactive dye (tracer) is injected into your bloodstream. After the tracer has traveled to your heart, an imaging device is used to measure how much of the tracer is absorbed by or distributed to various areas of your heart. This procedure is usually  done at a hospital and takes 2-4 hours. Tell a health care provider about:  Any allergies you have.  All medicines you are taking, including vitamins, herbs, eye drops, creams, and over-the-counter medicines.  Any problems you or family members have had with anesthetic medicines.  Any blood disorders you have.  Any surgeries you have had.  Any medical conditions you have.  Whether you are pregnant or may be pregnant. What are the risks? Generally, this is a safe procedure. However, problems may occur, including:  Serious chest pain and heart attack. This is only a risk if the stress portion of the test is done.  Rapid heartbeat.  Sensation of warmth in your chest. This usually passes quickly.  Allergic reaction to the tracer. What happens before the procedure?  Ask your health care provider about changing or stopping your regular medicines. This is especially important if you are taking diabetes medicines or blood thinners.  Follow instructions from your health care provider about eating or drinking restrictions.  Remove your jewelry on the day of the procedure. What happens during the procedure?  An IV will be inserted into one of your veins.  Your health care provider will inject a small amount of radioactive tracer through the  IV.  You will wait for 20-40 minutes while the tracer travels through your bloodstream.  Your heart activity will be monitored with an electrocardiogram (ECG).  You will lie down on an exam table.  Images of your heart will be taken for about 15-20 minutes.  You may also have a stress test. For this test, one of the following may be done: ? You will exercise on a treadmill or stationary bike. While you exercise, your heart's activity will be monitored with an ECG, and your blood pressure will be checked. ? You will be given medicines that will increase blood flow to parts of your heart. This is done if you are unable to exercise.  When  blood flow to your heart has peaked, a tracer will again be injected through the IV.  After 20-40 minutes, you will get back on the exam table and have more images taken of your heart.  Depending on the type of tracer used, scans may need to be repeated 3-4 hours later.  Your IV line will be removed when the procedure is over. The procedure may vary among health care providers and hospitals. What happens after the procedure?  Unless your health care provider tells you otherwise, you may return to your normal schedule, including diet, activities, and medicines.  Unless your health care provider tells you otherwise, you may increase your fluid intake. This will help to flush the contrast dye from your body. Drink enough fluid to keep your urine pale yellow.  Ask your health care provider, or the department that is doing the test: ? When will my results be ready? ? How will I get my results? Summary  A cardiac nuclear scan measures the blood flow to the heart when a person is resting and when he or she is exercising.  Tell your health care provider if you are pregnant.  Before the procedure, ask your health care provider about changing or stopping your regular medicines. This is especially important if you are taking diabetes medicines or blood thinners.  After the procedure, unless your health care provider tells you otherwise, increase your fluid intake. This will help flush the contrast dye from your body.  After the procedure, unless your health care provider tells you otherwise, you may return to your normal schedule, including diet, activities, and medicines. This information is not intended to replace advice given to you by your health care provider. Make sure you discuss any questions you have with your health care provider. Document Released: 02/06/2004 Document Revised: 06/27/2017 Document Reviewed: 06/27/2017 Elsevier Interactive Patient Education  2019 [employer].

## 2018-01-24 NOTE — Progress Notes (Signed)
Cardiology Office Note:    Date:  01/24/2018   ID:  [name], DOB [birth year], MRN [id number]  PCP:  [name], MD  Cardiologist:  Jenne Campus, MD    Referring MD: [name], MD   Chief Complaint  Patient presents with  . Follow-up  I have episodic chest pain  History of Present Illness:    Lonnie Boyd is a 77 y.o. male with incredible story about 1-1/2 years ago his wife died after that he ended up coming to the hospital because of shortness of breath chest pain he was find to have cardiomyopathy with severely diminished left ventricular ejection fraction.  He also got cardiac arrest that required resuscitation however after that cardiac catheterization was done which showed nonobstructive lesions.  He recovered completely from his cardiomyopathy and thinking was that was probably stress related cardiomyopathy it was a time that he wear a LifeVest however now because of normalization of left ventricular ejection fraction he is not wearing it.  We did have discussion with the time about ICD obesity now he does not meet criteria for it today he told me that few days ago he was trying to go to bed and started having some tightness in the lower portion of his chest.  He thinks may be related to the fact that he skipped his medication for stomach and he may be right however with his past medical history I think we are obligated to repeat his echocardiogram to recheck left ventricular ejection fraction as well as do a stress test to make sure there is no inducible ischemia.  Obviously I told him to keep taking his medications.  Past Medical History:  Diagnosis Date  . CAD (coronary artery disease)   . Cardiomyopathy ([place])   . Hyperlipemia   . Ventral hernia     Past Surgical History:  Procedure Laterality Date  . KIDNEY SURGERY    . LEFT HEART CATH AND CORONARY ANGIOGRAPHY N/A 09/20/2016   Procedure: LEFT HEART CATH AND CORONARY ANGIOGRAPHY;  Surgeon: Belva Crome, MD;  Location: [facility];  Service: Cardiovascular;  Laterality: N/A;    Current Medications: Current Meds  Medication Sig  . albuterol (PROVENTIL) (2.5 MG/3ML) 0.083% nebulizer solution USE 1 VIAL VIA NEBULIZER 4 TIMES DAILY as needed for shortness of breath  . ALPRAZolam (XANAX) 0.25 MG tablet Take 0.25 mg by mouth 2 (two) times daily as needed. for anxiety  . apixaban (ELIQUIS) 5 MG TABS tablet Take 1 tablet (5 mg total) by mouth 2 (two) times daily.  Marland Kitchen atorvastatin (LIPITOR) 40 MG tablet TAKE TWO TABLETS BY MOUTH EVERY DAY AT 6 PM  . clopidogrel (PLAVIX) 75 MG tablet Take 1 tablet (75 mg total) by mouth daily.  Marland Kitchen escitalopram (LEXAPRO) 10 MG tablet Take 10 mg by mouth daily.  Marland Kitchen losartan (COZAAR) 50 MG tablet TAKE ONE TABLET BY MOUTH DAILY  . metoprolol succinate (TOPROL-XL) 25 MG 24 hr tablet Take 1 tablet (25 mg total) by mouth daily. Take with or immediately following a meal.  . nitroGLYCERIN (NITROSTAT) 0.4 MG SL tablet Place 1 tablet (0.4 mg total) under the tongue every 5 (five) minutes x 3 doses as needed for chest pain.  . pantoprazole (PROTONIX) 40 MG tablet TAKE ONE TABLET BY MOUTH TWICE DAILY     Allergies:   Patient has no known allergies.   Social History   Socioeconomic History  . Marital status: Married    Spouse name:  Not on file  . Number of children: Not on file  . Years of education: Not on file  . Highest education level: Not on file  Occupational History  . Not on file  Social Needs  . Financial resource strain: Not on file  . Food insecurity:    Worry: Not on file    Inability: Not on file  . Transportation needs:    Medical: Not on file    Non-medical: Not on file  Tobacco Use  . Smoking status: Never Smoker  . Smokeless tobacco: Never Used  Substance and Sexual Activity  . Alcohol use: No  . Drug use: No  . Sexual activity: Not on file  Lifestyle  . Physical activity:    Days per week: Not on file    Minutes per session: Not  on file  . Stress: Not on file  Relationships  . Social connections:    Talks on phone: Not on file    Gets together: Not on file    Attends religious service: Not on file    Active member of club or organization: Not on file    Attends meetings of clubs or organizations: Not on file    Relationship status: Not on file  Other Topics Concern  . Not on file  Social History Narrative  . Not on file     Family History: The patient's family history includes Cancer in his father; Heart failure in his father; Hypertension in his father; Leukemia in his sister. ROS:   Please see the history of present illness.    All 14 point review of systems negative except as described per history of present illness  EKGs/Labs/Other Studies Reviewed:      Recent Labs: 02/15/2017: BUN 14; Creatinine, Ser 1.08; Potassium 4.3; Sodium 140  Recent Lipid Panel    Component Value Date/Time   CHOL 81 09/21/2016 0440   TRIG 38 09/21/2016 0440   HDL 34 (L) 09/21/2016 0440   CHOLHDL 2.4 09/21/2016 0440   VLDL 8 09/21/2016 0440   LDLCALC 39 09/21/2016 0440    Physical Exam:    VS:  BP 140/82   Pulse 68   Resp 14   Ht 5\' 8"  (1.727 m)   Wt 166 lb 12.8 oz (75.7 kg)   BMI 25.36 kg/m     Wt Readings from Last 3 Encounters:  01/24/18 166 lb 12.8 oz (75.7 kg)  10/26/17 172 lb (78 kg)  07/25/17 165 lb (74.8 kg)     GEN:  Well nourished, well developed in no acute distress HEENT: Normal NECK: No JVD; No carotid bruits LYMPHATICS: No lymphadenopathy CARDIAC: RRR, no murmurs, no rubs, no gallops RESPIRATORY:  Clear to auscultation without rales, wheezing or rhonchi  ABDOMEN: Soft, non-tender, non-distended MUSCULOSKELETAL:  No edema; No deformity  SKIN: Warm and dry LOWER EXTREMITIES: no swelling NEUROLOGIC:  Alert and oriented x 3 PSYCHIATRIC:  Normal affect   ASSESSMENT:    1. CAD- prior PCIs- cath- moderate CAD 09/22/16   2. Cardiac arrest with ventricular fibrillation ([place])   3. Ischemic  cardiomyopathy   4. PAF (paroxysmal atrial fibrillation) ([place])   5. Atypical chest pain    PLAN:    In order of problems listed above:  1. Coronary artery disease will do stress test to make sure there is no inducible ischemia we will continue present medications. 2. History of cardiac arrest secondary to most likely stress related cardiomyopathy with improvement. 3. Paroxysmal atrial fibrillation anticoagulated which I  will continue. 4. Ischemic cardiomyopathy on beta-blocker as well as Cozaar which I will continue. 5. Again echocardiogram will be done as well as stress test   Medication Adjustments/Labs and Tests Ordered: Current medicines are reviewed at length with the patient today.  Concerns regarding medicines are outlined above.  No orders of the defined types were placed in this encounter.  Medication changes: No orders of the defined types were placed in this encounter.   Signed, Park Liter, MD, [facility] 01/24/2018 1:35 PM    [facility]

## 2018-02-01 ENCOUNTER — Ambulatory Visit (INDEPENDENT_AMBULATORY_CARE_PROVIDER_SITE_OTHER): Payer: PPO

## 2018-02-01 VITALS — Ht 68.0 in | Wt 166.0 lb

## 2018-02-01 DIAGNOSIS — R0789 Other chest pain: Secondary | ICD-10-CM

## 2018-02-01 DIAGNOSIS — I255 Ischemic cardiomyopathy: Secondary | ICD-10-CM | POA: Diagnosis not present

## 2018-02-01 DIAGNOSIS — I25110 Atherosclerotic heart disease of native coronary artery with unstable angina pectoris: Secondary | ICD-10-CM | POA: Diagnosis not present

## 2018-02-01 MED ORDER — TECHNETIUM TC 99M TETROFOSMIN IV KIT
10.1000 | PACK | Freq: Once | INTRAVENOUS | Status: AC | PRN
  Administered 2018-02-01: 10.1 via INTRAVENOUS

## 2018-02-01 MED ORDER — REGADENOSON 0.4 MG/5ML IV SOLN
0.4000 mg | Freq: Once | INTRAVENOUS | Status: AC
  Administered 2018-02-01: 0.4 mg via INTRAVENOUS

## 2018-02-01 MED ORDER — TECHNETIUM TC 99M TETROFOSMIN IV KIT
31.2000 | PACK | Freq: Once | INTRAVENOUS | Status: AC | PRN
  Administered 2018-02-01: 31.2 via INTRAVENOUS

## 2018-02-02 LAB — MYOCARDIAL PERFUSION IMAGING
CHL CUP RESTING HR STRESS: 72 {beats}/min
LV dias vol: 115 mL (ref 62–150)
LVSYSVOL: 62 mL
NUC STRESS TID: 0.94
Peak HR: 108 {beats}/min
SDS: 7
SRS: 10
SSS: 17

## 2018-02-27 ENCOUNTER — Other Ambulatory Visit: Payer: Self-pay | Admitting: Cardiology

## 2018-02-27 ENCOUNTER — Other Ambulatory Visit: Payer: Self-pay

## 2018-02-27 DIAGNOSIS — I25110 Atherosclerotic heart disease of native coronary artery with unstable angina pectoris: Secondary | ICD-10-CM

## 2018-02-27 DIAGNOSIS — I255 Ischemic cardiomyopathy: Secondary | ICD-10-CM

## 2018-02-27 MED ORDER — ATORVASTATIN CALCIUM 40 MG PO TABS: ORAL_TABLET | ORAL | 2 refills | Status: DC

## 2018-03-01 ENCOUNTER — Ambulatory Visit (INDEPENDENT_AMBULATORY_CARE_PROVIDER_SITE_OTHER): Payer: PPO | Admitting: Cardiology

## 2018-03-01 ENCOUNTER — Encounter: Payer: Self-pay | Admitting: Cardiology

## 2018-03-01 VITALS — BP 130/76 | HR 94 | Ht 68.0 in | Wt 172.6 lb

## 2018-03-01 DIAGNOSIS — I255 Ischemic cardiomyopathy: Secondary | ICD-10-CM

## 2018-03-01 DIAGNOSIS — I25110 Atherosclerotic heart disease of native coronary artery with unstable angina pectoris: Secondary | ICD-10-CM

## 2018-03-01 DIAGNOSIS — I480 Paroxysmal atrial fibrillation: Secondary | ICD-10-CM | POA: Diagnosis not present

## 2018-03-01 DIAGNOSIS — E785 Hyperlipidemia, unspecified: Secondary | ICD-10-CM | POA: Diagnosis not present

## 2018-03-01 DIAGNOSIS — R0789 Other chest pain: Secondary | ICD-10-CM | POA: Diagnosis not present

## 2018-03-01 NOTE — Progress Notes (Signed)
Cardiology Office Note:    Date:  03/01/2018   ID:  Lonnie Boyd, DOB 1940/09/09, MRN [id number]  PCP:  [name], MD  Cardiologist:  Jenne Campus, MD    Referring MD: [name], MD   Chief Complaint  Patient presents with  . Follow up on testing  Doing very well  History of Present Illness:    Lonnie Boyd is a 78 y.o. male with past medical history significant for stress induced cardiomyopathy, he did have a cardiac arrest with a significant improvement thereafter.  Last time I seen him started complaining of having atypical chest pain at night.  We elected to do stress test stress test showed ejection fraction 45% old myocardial infarction some significant peri-infarct ischemia I brought him today to the office to talk about this he is doing great he is asymptomatic he walks he has no difficulty doing this he feels good.  There is no signs and symptoms of congestive heart failure.  Overall I think he always downgraded his symptoms.therefore we need to be careful.  Past Medical History:  Diagnosis Date  . CAD (coronary artery disease)   . Cardiomyopathy ([place])   . Hyperlipemia   . Ventral hernia     Past Surgical History:  Procedure Laterality Date  . KIDNEY SURGERY    . LEFT HEART CATH AND CORONARY ANGIOGRAPHY N/A 09/20/2016   Procedure: LEFT HEART CATH AND CORONARY ANGIOGRAPHY;  Surgeon: Belva Crome, MD;  Location: [facility];  Service: Cardiovascular;  Laterality: N/A;    Current Medications: Current Meds  Medication Sig  . albuterol (PROVENTIL) (2.5 MG/3ML) 0.083% nebulizer solution USE 1 VIAL VIA NEBULIZER 4 TIMES DAILY as needed for shortness of breath  . ALPRAZolam (XANAX) 0.25 MG tablet Take 0.25 mg by mouth 2 (two) times daily as needed. for anxiety  . apixaban (ELIQUIS) 5 MG TABS tablet Take 1 tablet (5 mg total) by mouth 2 (two) times daily.  Marland Kitchen atorvastatin (LIPITOR) 40 MG tablet TAKE TWO TABLETS BY MOUTH EVERY DAY AT 6 PM  .  escitalopram (LEXAPRO) 10 MG tablet Take 10 mg by mouth daily.  Marland Kitchen losartan (COZAAR) 50 MG tablet TAKE ONE TABLET BY MOUTH DAILY  . metoprolol succinate (TOPROL-XL) 25 MG 24 hr tablet Take 1 tablet (25 mg total) by mouth daily. Take with or immediately following a meal.  . nitroGLYCERIN (NITROSTAT) 0.4 MG SL tablet Place 1 tablet (0.4 mg total) under the tongue every 5 (five) minutes x 3 doses as needed for chest pain.  . pantoprazole (PROTONIX) 40 MG tablet TAKE ONE TABLET BY MOUTH TWICE DAILY     Allergies:   Patient has no known allergies.   Social History   Socioeconomic History  . Marital status: Married    Spouse name: Not on file  . Number of children: Not on file  . Years of education: Not on file  . Highest education level: Not on file  Occupational History  . Not on file  Social Needs  . Financial resource strain: Not on file  . Food insecurity:    Worry: Not on file    Inability: Not on file  . Transportation needs:    Medical: Not on file    Non-medical: Not on file  Tobacco Use  . Smoking status: Never Smoker  . Smokeless tobacco: Never Used  Substance and Sexual Activity  . Alcohol use: No  . Drug use: No  . Sexual activity: Not on  file  Lifestyle  . Physical activity:    Days per week: Not on file    Minutes per session: Not on file  . Stress: Not on file  Relationships  . Social connections:    Talks on phone: Not on file    Gets together: Not on file    Attends religious service: Not on file    Active member of club or organization: Not on file    Attends meetings of clubs or organizations: Not on file    Relationship status: Not on file  Other Topics Concern  . Not on file  Social History Narrative  . Not on file     Family History: The patient's family history includes Cancer in his father; Heart failure in his father; Hypertension in his father; Leukemia in his sister. ROS:   Please see the history of present illness.    All 14 point review  of systems negative except as described per history of present illness  EKGs/Labs/Other Studies Reviewed:      Recent Labs: No results found for requested labs within last 8760 hours.  Recent Lipid Panel    Component Value Date/Time   CHOL 81 09/21/2016 0440   TRIG 38 09/21/2016 0440   HDL 34 (L) 09/21/2016 0440   CHOLHDL 2.4 09/21/2016 0440   VLDL 8 09/21/2016 0440   LDLCALC 39 09/21/2016 0440    Physical Exam:    VS:  BP 130/76   Pulse 94   Ht 5\' 8"  (1.727 m)   Wt 172 lb 9.6 oz (78.3 kg)   SpO2 90%   BMI 26.24 kg/m     Wt Readings from Last 3 Encounters:  03/01/18 172 lb 9.6 oz (78.3 kg)  02/01/18 166 lb (75.3 kg)  01/24/18 166 lb 12.8 oz (75.7 kg)     GEN:  Well nourished, well developed in no acute distress HEENT: Normal NECK: No JVD; No carotid bruits LYMPHATICS: No lymphadenopathy CARDIAC: RRR, no murmurs, no rubs, no gallops RESPIRATORY:  Clear to auscultation without rales, wheezing or rhonchi  ABDOMEN: Soft, non-tender, non-distended MUSCULOSKELETAL:  No edema; No deformity  SKIN: Warm and dry LOWER EXTREMITIES: no swelling NEUROLOGIC:  Alert and oriented x 3 PSYCHIATRIC:  Normal affect   ASSESSMENT:    1. Ischemic cardiomyopathy   2. CAD- prior PCIs- cath- moderate CAD 09/22/16   3. Atypical chest pain   4. Dyslipidemia   5. PAF (paroxysmal atrial fibrillation) (HCC)    PLAN:    In order of problems listed above:  1. Ischemic cardiomyopathy I will ask him to have repeated echocardiogram to recheck left ventricular ejection fraction the meantime I will continue present medications. 2. Coronary artery disease status post cardiac catheterization in August 2018 now abnormal stress test showing ischemia peri-infarct.  We talked potential about doing cardiac catheterization but he is doing so well he does not want to do it I think there is some value of trying addition of ranolazine which we will do. 3. Atypical chest pain denies having any  lately. 4. Dyslipidemia continue with Lipitor 40. 5. Paroxysmal atrial fibrillation denies having any.  He is taking aspirin which I will continue atrial fibrillation happen only when he gets significant cardiomyopathy.   Medication Adjustments/Labs and Tests Ordered: Current medicines are reviewed at length with the patient today.  Concerns regarding medicines are outlined above.  No orders of the defined types were placed in this encounter.  Medication changes: No orders of the defined types were  placed in this encounter.   Signed, Park Liter, MD, [facility] 03/01/2018 11:00 AM    [place]

## 2018-03-01 NOTE — Addendum Note (Signed)
Addended by: Ashok Norris on: 03/01/2018 11:16 AM   Modules accepted: Orders

## 2018-03-01 NOTE — Addendum Note (Signed)
Addended by: Ashok Norris on: 03/01/2018 11:28 AM   Modules accepted: Orders

## 2018-03-01 NOTE — Patient Instructions (Signed)
Medication Instructions:  Your physician recommends that you continue on your current medications as directed. Please refer to the Current Medication list given to you today.  If you need a refill on your cardiac medications before your next appointment, please call your pharmacy.   Lab work: None.  If you have labs (blood work) drawn today and your tests are completely normal, you will receive your results only by: . MyChart Message (if you have MyChart) OR . A paper copy in the mail If you have any lab test that is abnormal or we need to change your treatment, we will call you to review the results.  Testing/Procedures: Your physician has requested that you have an echocardiogram. Echocardiography is a painless test that uses sound waves to create images of your heart. It provides your doctor with information about the size and shape of your heart and how well your heart's chambers and valves are working. This procedure takes approximately one hour. There are no restrictions for this procedure.    Follow-Up: At [facility], you and your health needs are our priority.  As part of our continuing mission to provide you with exceptional heart care, we have created designated Provider Care Teams.  These Care Teams include your primary Cardiologist (physician) and Advanced Practice Providers (APPs -  Physician Assistants and Nurse Practitioners) who all work together to provide you with the care you need, when you need it. You will need a follow up appointment in 3 months.  Please call our office 2 months in advance to schedule this appointment.  You may see Robert Krasowski, MD or another member of our [facility] Provider Team in [place]: Brian Munley, MD . Rajan Revankar, MD  Any Other Special Instructions Will Be Listed Below (If Applicable).   Echocardiogram An echocardiogram is a procedure that uses painless sound waves (ultrasound) to produce an image of the heart. Images from  an echocardiogram can provide important information about:  Signs of coronary artery disease (CAD).  Aneurysm detection. An aneurysm is a weak or damaged part of an artery wall that bulges out from the normal force of blood pumping through the body.  Heart size and shape. Changes in the size or shape of the heart can be associated with certain conditions, including heart failure, aneurysm, and CAD.  Heart muscle function.  Heart valve function.  Signs of a past heart attack.  Fluid buildup around the heart.  Thickening of the heart muscle.  A tumor or infectious growth around the heart valves. Tell a health care provider about:  Any allergies you have.  All medicines you are taking, including vitamins, herbs, eye drops, creams, and over-the-counter medicines.  Any blood disorders you have.  Any surgeries you have had.  Any medical conditions you have.  Whether you are pregnant or may be pregnant. What are the risks? Generally, this is a safe procedure. However, problems may occur, including:  Allergic reaction to dye (contrast) that may be used during the procedure. What happens before the procedure? No specific preparation is needed. You may eat and drink normally. What happens during the procedure?   An IV tube may be inserted into one of your veins.  You may receive contrast through this tube. A contrast is an injection that improves the quality of the pictures from your heart.  A gel will be applied to your chest.  A wand-like tool (transducer) will be moved over your chest. The gel will help to transmit the sound   waves from the transducer.  The sound waves will harmlessly bounce off of your heart to allow the heart images to be captured in real-time motion. The images will be recorded on a computer. The procedure may vary among health care providers and hospitals. What happens after the procedure?  You may return to your normal, everyday life, including diet,  activities, and medicines, unless your health care provider tells you not to do that. Summary  An echocardiogram is a procedure that uses painless sound waves (ultrasound) to produce an image of the heart.  Images from an echocardiogram can provide important information about the size and shape of your heart, heart muscle function, heart valve function, and fluid buildup around your heart.  You do not need to do anything to prepare before this procedure. You may eat and drink normally.  After the echocardiogram is completed, you may return to your normal, everyday life, unless your health care provider tells you not to do that. This information is not intended to replace advice given to you by your health care provider. Make sure you discuss any questions you have with your health care provider. Document Released: 01/09/2000 Document Revised: 02/14/2016 Document Reviewed: 02/14/2016 Elsevier Interactive Patient Education  2019 [employer].

## 2018-03-03 DIAGNOSIS — J45909 Unspecified asthma, uncomplicated: Secondary | ICD-10-CM | POA: Diagnosis not present

## 2018-03-07 ENCOUNTER — Other Ambulatory Visit: Payer: PPO

## 2018-03-14 DIAGNOSIS — J45909 Unspecified asthma, uncomplicated: Secondary | ICD-10-CM | POA: Diagnosis not present

## 2018-03-14 DIAGNOSIS — N390 Urinary tract infection, site not specified: Secondary | ICD-10-CM | POA: Diagnosis not present

## 2018-03-15 DIAGNOSIS — N390 Urinary tract infection, site not specified: Secondary | ICD-10-CM | POA: Diagnosis not present

## 2018-04-04 ENCOUNTER — Ambulatory Visit (INDEPENDENT_AMBULATORY_CARE_PROVIDER_SITE_OTHER): Payer: PPO

## 2018-04-04 ENCOUNTER — Other Ambulatory Visit: Payer: PPO

## 2018-04-04 DIAGNOSIS — Z0000 Encounter for general adult medical examination without abnormal findings: Secondary | ICD-10-CM | POA: Diagnosis not present

## 2018-04-04 DIAGNOSIS — Z136 Encounter for screening for cardiovascular disorders: Secondary | ICD-10-CM | POA: Diagnosis not present

## 2018-04-04 DIAGNOSIS — I255 Ischemic cardiomyopathy: Secondary | ICD-10-CM

## 2018-04-04 DIAGNOSIS — R0789 Other chest pain: Secondary | ICD-10-CM | POA: Diagnosis not present

## 2018-04-04 DIAGNOSIS — Z125 Encounter for screening for malignant neoplasm of prostate: Secondary | ICD-10-CM | POA: Diagnosis not present

## 2018-04-04 DIAGNOSIS — E785 Hyperlipidemia, unspecified: Secondary | ICD-10-CM | POA: Diagnosis not present

## 2018-04-04 DIAGNOSIS — Z1331 Encounter for screening for depression: Secondary | ICD-10-CM | POA: Diagnosis not present

## 2018-04-04 DIAGNOSIS — Z9181 History of falling: Secondary | ICD-10-CM | POA: Diagnosis not present

## 2018-04-04 DIAGNOSIS — Z23 Encounter for immunization: Secondary | ICD-10-CM | POA: Diagnosis not present

## 2018-04-04 NOTE — Progress Notes (Signed)
Complete echocardiogram has been performed.  Jimmy Johneisha Broaden RDCS, RVT 

## 2018-04-07 DIAGNOSIS — I480 Paroxysmal atrial fibrillation: Secondary | ICD-10-CM | POA: Diagnosis not present

## 2018-04-07 DIAGNOSIS — R972 Elevated prostate specific antigen [PSA]: Secondary | ICD-10-CM | POA: Diagnosis not present

## 2018-04-07 DIAGNOSIS — J4540 Moderate persistent asthma, uncomplicated: Secondary | ICD-10-CM | POA: Diagnosis not present

## 2018-04-07 DIAGNOSIS — Z6827 Body mass index (BMI) 27.0-27.9, adult: Secondary | ICD-10-CM | POA: Diagnosis not present

## 2018-04-07 DIAGNOSIS — Z79899 Other long term (current) drug therapy: Secondary | ICD-10-CM | POA: Diagnosis not present

## 2018-04-07 DIAGNOSIS — I2510 Atherosclerotic heart disease of native coronary artery without angina pectoris: Secondary | ICD-10-CM | POA: Diagnosis not present

## 2018-04-07 DIAGNOSIS — E785 Hyperlipidemia, unspecified: Secondary | ICD-10-CM | POA: Diagnosis not present

## 2018-05-08 DIAGNOSIS — S0100XA Unspecified open wound of scalp, initial encounter: Secondary | ICD-10-CM | POA: Diagnosis not present

## 2018-05-08 DIAGNOSIS — Z01812 Encounter for preprocedural laboratory examination: Secondary | ICD-10-CM | POA: Diagnosis not present

## 2018-05-08 DIAGNOSIS — M25551 Pain in right hip: Secondary | ICD-10-CM | POA: Diagnosis not present

## 2018-05-08 DIAGNOSIS — R0609 Other forms of dyspnea: Secondary | ICD-10-CM | POA: Diagnosis not present

## 2018-05-08 DIAGNOSIS — Z23 Encounter for immunization: Secondary | ICD-10-CM | POA: Diagnosis not present

## 2018-05-09 DIAGNOSIS — M25551 Pain in right hip: Secondary | ICD-10-CM | POA: Diagnosis not present

## 2018-05-11 ENCOUNTER — Telehealth: Payer: Self-pay | Admitting: Cardiology

## 2018-05-11 NOTE — Telephone Encounter (Signed)
Virtual Visit Pre-Appointment Phone Call  Steps For Call:  1. Confirm consent - "In the setting of the current Covid19 crisis, you are scheduled for a (phone or video) visit with your provider on (date) at (time).  Just as we do with many in-office visits, in order for you to participate in this visit, we must obtain consent.  If you'd like, I can send this to your mychart (if signed up) or email for you to review.  Otherwise, I can obtain your verbal consent now.  All virtual visits are billed to your insurance company just like a normal visit would be.  By agreeing to a virtual visit, we'd like you to understand that the technology does not allow for your provider to perform an examination, and thus may limit your provider's ability to fully assess your condition.  Finally, though the technology is pretty good, we cannot assure that it will always work on either your or our end, and in the setting of a video visit, we may have to convert it to a phone-only visit.  In either situation, we cannot ensure that we have a secure connection.  Are you willing to proceed?" STAFF: Did the patient verbally acknowledge consent to telehealth visit? Document YES/NO here: YES  2. Confirm the BEST phone number to call the day of the visit by including in appointment notes  3. Give patient instructions for WebEx/MyChart download to smartphone as below or Doximity/Doxy.me if video visit (depending on what platform provider is using)  4. Advise patient to be prepared with their blood pressure, heart rate, weight, any heart rhythm information, their current medicines, and a piece of paper and pen handy for any instructions they may receive the day of their visit  5. Inform patient they will receive a phone call 15 minutes prior to their appointment time (may be from unknown caller ID) so they should be prepared to answer  6. Confirm that appointment type is correct in Epic appointment notes (VIDEO vs  PHONE)     TELEPHONE CALL NOTE  Lonnie Boyd has been deemed a candidate for a follow-up tele-health visit to limit community exposure during the Covid-19 pandemic. I spoke with the patient via phone to ensure availability of phone/video source, confirm preferred email & phone number, and discuss instructions and expectations.  I reminded Lonnie Boyd to be prepared with any vital sign and/or heart rhythm information that could potentially be obtained via home monitoring, at the time of his visit. I reminded Lonnie Boyd to expect a phone call at the time of his visit if his visit.  Lonnie Boyd 05/11/2018 1:20 PM   INSTRUCTIONS FOR DOWNLOADING THE [place] APP TO SMARTPHONE  - If Apple, ask patient to go to [employer] and type in WebEx in the search bar. [employer], the blue/green circle. If Android, go to [employer] and type in [employer] in the search bar. The app is free but as with any other app downloads, their phone may require them to verify saved payment information or Apple/Android password.  - The patient does NOT have to create an account. - On the day of the visit, the assist will walk the patient through joining the meeting with the meeting number/password.  INSTRUCTIONS FOR DOWNLOADING THE MYCHART APP TO SMARTPHONE  - The patient must first make sure to have activated MyChart and know their login information - If Apple, go to [employer] and type in  MyChart in the search bar and download the app. If Android, ask patient to go to [employer] and type in [place] in the search bar and download the app. The app is free but as with any other app downloads, their phone may require them to verify saved payment information or Apple/Android password.  - The patient will need to then log into the app with their MyChart username and password, and select [facility] as their healthcare provider to link the account. When it is time for your visit, go to  the MyChart app, find appointments, and click Begin Video Visit. Be sure to Select Allow for your device to access the Microphone and Camera for your visit. You will then be connected, and your provider will be with you shortly.  **If they have any issues connecting, or need assistance please contact MyChart service desk [phone])**  **If using a computer, in order to ensure the best quality for their visit they will need to use either of the following Internet Browsers: [employer], or Google Chrome**  IF USING DOXIMITY or DOXY.ME - The patient will receive a link just prior to their visit, either by text or email (to be determined day of appointment depending on if it's doxy.me or Doximity).     FULL LENGTH CONSENT FOR TELE-HEALTH VISIT   I hereby voluntarily request, consent and authorize [facility] and its employed or contracted physicians, physician assistants, nurse practitioners or other licensed health care professionals (the Practitioner), to provide me with telemedicine health care services (the Services") as deemed necessary by the treating Practitioner. I acknowledge and consent to receive the Services by the Practitioner via telemedicine. I understand that the telemedicine visit will involve communicating with the Practitioner through live audiovisual communication technology and the disclosure of certain medical information by electronic transmission. I acknowledge that I have been given the opportunity to request an in-person assessment or other available alternative prior to the telemedicine visit and am voluntarily participating in the telemedicine visit.  I understand that I have the right to withhold or withdraw my consent to the use of telemedicine in the course of my care at any time, without affecting my right to future care or treatment, and that the Practitioner or I may terminate the telemedicine visit at any time. I understand that I have the right to  inspect all information obtained and/or recorded in the course of the telemedicine visit and may receive copies of available information for a reasonable fee.  I understand that some of the potential risks of receiving the Services via telemedicine include:   Delay or interruption in medical evaluation due to technological equipment failure or disruption;  Information transmitted may not be sufficient (e.g. poor resolution of images) to allow for appropriate medical decision making by the Practitioner; and/or   In rare instances, security protocols could fail, causing a breach of personal health information.  Furthermore, I acknowledge that it is my responsibility to provide information about my medical history, conditions and care that is complete and accurate to the best of my ability. I acknowledge that Practitioner's advice, recommendations, and/or decision may be based on factors not within their control, such as incomplete or inaccurate data provided by me or distortions of diagnostic images or specimens that may result from electronic transmissions. I understand that the practice of medicine is not an exact science and that Practitioner makes no warranties or guarantees regarding treatment outcomes. I acknowledge that I will receive a copy  of this consent concurrently upon execution via email to the email address I last provided but may also request a printed copy by calling the office of [facility].    I understand that my insurance will be billed for this visit.   I have read or had this consent read to me.  I understand the contents of this consent, which adequately explains the benefits and risks of the Services being provided via telemedicine.   I have been provided ample opportunity to ask questions regarding this consent and the Services and have had my questions answered to my satisfaction.  I give my informed consent for the services to be provided through the use of  telemedicine in my medical care  By participating in this telemedicine visit I agree to the above.

## 2018-05-25 DIAGNOSIS — L821 Other seborrheic keratosis: Secondary | ICD-10-CM | POA: Diagnosis not present

## 2018-05-25 DIAGNOSIS — L570 Actinic keratosis: Secondary | ICD-10-CM | POA: Diagnosis not present

## 2018-05-30 ENCOUNTER — Other Ambulatory Visit: Payer: Self-pay | Admitting: Cardiology

## 2018-06-20 DIAGNOSIS — H1033 Unspecified acute conjunctivitis, bilateral: Secondary | ICD-10-CM | POA: Diagnosis not present

## 2018-06-23 ENCOUNTER — Telehealth (INDEPENDENT_AMBULATORY_CARE_PROVIDER_SITE_OTHER): Payer: PPO | Admitting: Cardiology

## 2018-06-23 ENCOUNTER — Other Ambulatory Visit: Payer: Self-pay

## 2018-06-23 ENCOUNTER — Encounter: Payer: Self-pay | Admitting: Cardiology

## 2018-06-23 DIAGNOSIS — I255 Ischemic cardiomyopathy: Secondary | ICD-10-CM | POA: Diagnosis not present

## 2018-06-23 DIAGNOSIS — I480 Paroxysmal atrial fibrillation: Secondary | ICD-10-CM

## 2018-06-23 DIAGNOSIS — I25110 Atherosclerotic heart disease of native coronary artery with unstable angina pectoris: Secondary | ICD-10-CM

## 2018-06-23 NOTE — Progress Notes (Signed)
Virtual Visit via Telephone Note   This visit type was conducted due to national recommendations for restrictions regarding the COVID-19 Pandemic (e.g. social distancing) in an effort to limit this patient's exposure and mitigate transmission in our community.  Due to his co-morbid illnesses, this patient is at least at moderate risk for complications without adequate follow up.  This format is felt to be most appropriate for this patient at this time.  The patient did not have access to video technology/had technical difficulties with video requiring transitioning to audio format only (telephone).  All issues noted in this document were discussed and addressed.  No physical exam could be performed with this format.  Please refer to the patient's chart for his  consent to telehealth for [facility].  Evaluation Performed:  Follow-up visit  This visit type was conducted due to national recommendations for restrictions regarding the COVID-19 Pandemic (e.g. social distancing).  This format is felt to be most appropriate for this patient at this time.  All issues noted in this document were discussed and addressed.  No physical exam was performed (except for noted visual exam findings with Video Visits).  Please refer to the patient's chart (MyChart message for video visits and phone note for telephone visits) for the patient's consent to telehealth for [facility].  Date:  06/23/2018  ID: Lonnie Boyd, DOB 08-13-1940, MRN [id number]   Patient Location: [place] [address]   Provider location:   [place] Office  PCP:  [name], MD  Cardiologist:  Jenne Campus, MD     Chief Complaint: Doing well  History of Present Illness:    Lonnie Boyd is a 78 y.o. male  who presents via audio/video conferencing for a telehealth visit today.  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  He eventually ended up having cardiac arrest however overall improved.  Now he is feeling well denies having any chest pain tightness squeezing pressure burning chest no shortness of breath he described however lack of energy.  He start taking vitamin B12 as well as B6 and he said he is feeling better.  He is very busy being taking care of his household.  He also tell me that 3 times a day he goes for a walk.  Stress test was analyzed with him showed apical MI with some peri-infarct ischemia.  Also echocardiogram being reviewed which showed apical hypokinesis.  He is already on beta-blocker and ARB which I will continue and he is asymptomatic and stable   The patient does not have symptoms concerning for COVID-19 infection (fever, chills, cough, or new SHORTNESS OF BREATH).    Prior CV studies:   The following studies were reviewed today:  Stress test done in January 2020 showed:  The left ventricular ejection fraction is mildly decreased (45-54%).  Nuclear stress EF: 46%.  Blood pressure demonstrated a normal response to exercise.  There was no ST segment deviation noted during stress.  Defect 1: There is a large defect of moderate severity present in the mid anterolateral and apex location.  Findings consistent with prior myocardial infarction with peri-infarct ischemia.  This is an intermediate risk study.  Scar involving antero - lateral wall indicating old MI with moderate degree of periinfarct ischemia located in basal portion of the antero - lateral wall. EF 46%.       Echocardiogram done on April 04, 2018 showed:  1. RWM: Apical lateral segment, apical anterior segment, and apex are abnormal.  2. The left ventricle has mild-moderately reduced systolic function, with an ejection fraction of 40-45%. The cavity size was normal. Left ventricular diastolic function could not be evaluated secondary to atrial fibrillation.  3. The right  ventricle has normal systolic function. The cavity was normal. There is no increase in right ventricular wall thickness.  4. Left atrial size was moderately dilated.  5. Mitral valve regurgitation is moderate by color flow Doppler. No evidence of mitral valve stenosis.  6. The aortic valve is tricuspid Mild thickening of the aortic valve Aortic valve regurgitation is mild by color flow Doppler. no stenosis of the aortic valve.  Past Medical History:  Diagnosis Date  . CAD (coronary artery disease)   . Cardiomyopathy ([place])   . Hyperlipemia   . Ventral hernia     Past Surgical History:  Procedure Laterality Date  . KIDNEY SURGERY    . LEFT HEART CATH AND CORONARY ANGIOGRAPHY N/A 09/20/2016   Procedure: LEFT HEART CATH AND CORONARY ANGIOGRAPHY;  Surgeon: Belva Crome, MD;  Location: [place] [facility];  Service: Cardiovascular;  Laterality: N/A;     Current Meds  Medication Sig  . albuterol (PROVENTIL) (2.5 MG/3ML) 0.083% nebulizer solution USE 1 VIAL VIA NEBULIZER 4 TIMES DAILY as needed for shortness of breath  . ALPRAZolam (XANAX) 0.25 MG tablet Take 0.25 mg by mouth 2 (two) times daily as needed. for anxiety  . apixaban (ELIQUIS) 5 MG TABS tablet Take 1 tablet (5 mg total) by mouth 2 (two) times daily.  Marland Kitchen atorvastatin (LIPITOR) 40 MG tablet TAKE TWO TABLETS BY MOUTH EVERY DAY AT 6 PM  . escitalopram (LEXAPRO) 10 MG tablet Take 10 mg by mouth daily.  Marland Kitchen losartan (COZAAR) 50 MG tablet TAKE ONE TABLET BY MOUTH DAILY  . metoprolol succinate (TOPROL-XL) 25 MG 24 hr tablet Take 1 tablet (25 mg total) by mouth daily. Take with or immediately following a meal.  . nitroGLYCERIN (NITROSTAT) 0.4 MG SL tablet Place 1 tablet (0.4 mg total) under the tongue every 5 (five) minutes x 3 doses as needed for chest pain.      Family History: The patient's family history includes Cancer in his father; Heart failure in his father; Hypertension in his father; Leukemia in his sister.   ROS:   Please  see the history of present illness.     All other systems reviewed and are negative.   Labs/Other Tests and Data Reviewed:     Recent Labs: No results found for requested labs within last 8760 hours.  Recent Lipid Panel    Component Value Date/Time   CHOL 81 09/21/2016 0440   TRIG 38 09/21/2016 0440   HDL 34 (L) 09/21/2016 0440   CHOLHDL 2.4 09/21/2016 0440   VLDL 8 09/21/2016 0440   LDLCALC 39 09/21/2016 0440      Exam:    Vital Signs:  There were no vitals taken for this visit.    Wt Readings from Last 3 Encounters:  03/01/18 172 lb 9.6 oz (78.3 kg)  02/01/18 166 lb (75.3 kg)  01/24/18 166 lb 12.8 oz (75.7 kg)     Well nourished, well developed in no acute distress. Alert awake oriented x3.  He had no technical ability to establish video link therefore we took only over the phone he is doing well happy to be able to talk to me without any distress.  Diagnosis for  this visit:   1. Ischemic cardiomyopathy   2. PAF (paroxysmal atrial fibrillation) ([place])   3. CAD- prior PCIs- cath- moderate CAD 09/22/16      ASSESSMENT & PLAN:    1.  Ischemic cardiomyopathy with diminished ejection fraction on beta-blocker as well as ARB which I will continue.  We initiated conversation about potentially upgrading this to [facility] however he does not want to change anything. 2.  Proximal atrial fibrillation denies having an episode.  Anticoagulated which I will continue. 3.  Coronary artery disease.  Old myocardial infarction with minimal area of peri-infarct ischemia now patient is asymptomatic, therefore, we will continue present management.  COVID-19 Education: The signs and symptoms of COVID-19 were discussed with the patient and how to seek care for testing (follow up with PCP or arrange E-visit).  The importance of social distancing was discussed today.  Patient Risk:   After full review of this patients clinical status, I feel that they are at least moderate risk at this  time.  Time:   Today, I have spent 18 minutes with the patient with telehealth technology discussing pt health issues.  I spent 5 minutes reviewing her chart before the visit.  Visit was finished at 9:50 AM.    Medication Adjustments/Labs and Tests Ordered: Current medicines are reviewed at length with the patient today.  Concerns regarding medicines are outlined above.  No orders of the defined types were placed in this encounter.  Medication changes: No orders of the defined types were placed in this encounter.    Disposition: Follow-up 3 months  Signed, [place] Liter, MD, [facility] 06/23/2018 9:48 AM    [place]

## 2018-06-23 NOTE — Patient Instructions (Signed)
Medication Instructions:  Your physician recommends that you continue on your current medications as directed. Please refer to the Current Medication list given to you today.  If you need a refill on your cardiac medications before your next appointment, please call your pharmacy.   Lab work: None.  If you have labs (blood work) drawn today and your tests are completely normal, you will receive your results only by: . MyChart Message (if you have MyChart) OR . A paper copy in the mail If you have any lab test that is abnormal or we need to change your treatment, we will call you to review the results.  Testing/Procedures: None.   Follow-Up: At [facility], you and your health needs are our priority.  As part of our continuing mission to provide you with exceptional heart care, we have created designated Provider Care Teams.  These Care Teams include your primary Cardiologist (physician) and Advanced Practice Providers (APPs -  Physician Assistants and Nurse Practitioners) who all work together to provide you with the care you need, when you need it. You will need a follow up appointment in 3 months.  Please call our office 2 months in advance to schedule this appointment.  You may see Robert Krasowski, MD or another member of our [facility] Provider Team in [place]: Brian Munley, MD . Rajan Revankar, MD  Any Other Special Instructions Will Be Listed Below (If Applicable).     

## 2018-07-10 DIAGNOSIS — R52 Pain, unspecified: Secondary | ICD-10-CM | POA: Diagnosis not present

## 2018-07-10 DIAGNOSIS — R0902 Hypoxemia: Secondary | ICD-10-CM | POA: Diagnosis not present

## 2018-07-10 DIAGNOSIS — W19XXXA Unspecified fall, initial encounter: Secondary | ICD-10-CM | POA: Diagnosis not present

## 2018-07-10 DIAGNOSIS — S0990XA Unspecified injury of head, initial encounter: Secondary | ICD-10-CM | POA: Diagnosis not present

## 2018-07-10 DIAGNOSIS — S199XXA Unspecified injury of neck, initial encounter: Secondary | ICD-10-CM | POA: Diagnosis not present

## 2018-07-10 DIAGNOSIS — S0101XA Laceration without foreign body of scalp, initial encounter: Secondary | ICD-10-CM | POA: Diagnosis not present

## 2018-07-10 DIAGNOSIS — R51 Headache: Secondary | ICD-10-CM | POA: Diagnosis not present

## 2018-07-11 DIAGNOSIS — I480 Paroxysmal atrial fibrillation: Secondary | ICD-10-CM | POA: Diagnosis not present

## 2018-07-11 DIAGNOSIS — S0101XA Laceration without foreign body of scalp, initial encounter: Secondary | ICD-10-CM | POA: Diagnosis not present

## 2018-08-28 ENCOUNTER — Other Ambulatory Visit: Payer: Self-pay | Admitting: Cardiology

## 2018-09-12 ENCOUNTER — Telehealth: Payer: PPO | Admitting: Cardiology

## 2018-09-19 DIAGNOSIS — M170 Bilateral primary osteoarthritis of knee: Secondary | ICD-10-CM | POA: Diagnosis not present

## 2018-09-19 DIAGNOSIS — H9193 Unspecified hearing loss, bilateral: Secondary | ICD-10-CM | POA: Diagnosis not present

## 2018-09-19 DIAGNOSIS — H6123 Impacted cerumen, bilateral: Secondary | ICD-10-CM | POA: Diagnosis not present

## 2018-10-03 DIAGNOSIS — L570 Actinic keratosis: Secondary | ICD-10-CM | POA: Diagnosis not present

## 2018-10-11 DIAGNOSIS — H353131 Nonexudative age-related macular degeneration, bilateral, early dry stage: Secondary | ICD-10-CM | POA: Diagnosis not present

## 2018-10-25 ENCOUNTER — Encounter: Payer: Self-pay | Admitting: Cardiology

## 2018-10-25 ENCOUNTER — Ambulatory Visit (INDEPENDENT_AMBULATORY_CARE_PROVIDER_SITE_OTHER): Payer: PPO | Admitting: Cardiology

## 2018-10-25 ENCOUNTER — Other Ambulatory Visit: Payer: Self-pay

## 2018-10-25 VITALS — BP 124/76 | HR 73 | Ht 68.0 in | Wt 167.0 lb

## 2018-10-25 DIAGNOSIS — I25110 Atherosclerotic heart disease of native coronary artery with unstable angina pectoris: Secondary | ICD-10-CM

## 2018-10-25 DIAGNOSIS — E785 Hyperlipidemia, unspecified: Secondary | ICD-10-CM | POA: Diagnosis not present

## 2018-10-25 DIAGNOSIS — I480 Paroxysmal atrial fibrillation: Secondary | ICD-10-CM | POA: Diagnosis not present

## 2018-10-25 DIAGNOSIS — I255 Ischemic cardiomyopathy: Secondary | ICD-10-CM

## 2018-10-25 NOTE — Progress Notes (Signed)
Cardiology Office Note:    Date:  10/25/2018   ID:  Lonnie Boyd, DOB 04-19-1940, MRN [id number]  PCP:  [name], MD  Cardiologist:  Jenne Campus, MD    Referring MD: [name], MD   Chief Complaint  Patient presents with  . Follow-up  Doing well  History of Present Illness:    Lonnie Boyd is a 78 y.o. male history of stress-induced cardiomyopathy, coronary artery disease, dyslipidemia office follow-up overall doing well denies have any chest pain tightness squeezing pressure burning chest.  Described to have some shortness of breath with exertion but otherwise seems to be doing well.  Past Medical History:  Diagnosis Date  . CAD (coronary artery disease)   . Cardiomyopathy ([place])   . Hyperlipemia   . Ventral hernia     Past Surgical History:  Procedure Laterality Date  . KIDNEY SURGERY    . LEFT HEART CATH AND CORONARY ANGIOGRAPHY N/A 09/20/2016   Procedure: LEFT HEART CATH AND CORONARY ANGIOGRAPHY;  Surgeon: Belva Crome, MD;  Location: [place] [facility];  Service: Cardiovascular;  Laterality: N/A;    Current Medications: Current Meds  Medication Sig  . albuterol (PROVENTIL) (2.5 MG/3ML) 0.083% nebulizer solution USE 1 VIAL VIA NEBULIZER 4 TIMES DAILY as needed for shortness of breath  . ALPRAZolam (XANAX) 0.25 MG tablet Take 0.25 mg by mouth 2 (two) times daily as needed. for anxiety  . atorvastatin (LIPITOR) 40 MG tablet TAKE TWO TABLETS BY MOUTH EVERY DAY AT 6 PM  . ELIQUIS 5 MG TABS tablet TAKE ONE TABLET BY MOUTH TWICE DAILY  . losartan (COZAAR) 50 MG tablet TAKE ONE TABLET BY MOUTH DAILY  . metoprolol succinate (TOPROL-XL) 25 MG 24 hr tablet Take 1 tablet (25 mg total) by mouth daily. Take with or immediately following a meal.  . nitroGLYCERIN (NITROSTAT) 0.4 MG SL tablet Place 1 tablet (0.4 mg total) under the tongue every 5 (five) minutes x 3 doses as needed for chest pain.     Allergies:   Patient has no known allergies.    Social History   Socioeconomic History  . Marital status: Married    Spouse name: Not on file  . Number of children: Not on file  . Years of education: Not on file  . Highest education level: Not on file  Occupational History  . Not on file  Social Needs  . Financial resource strain: Not on file  . Food insecurity    Worry: Not on file    Inability: Not on file  . Transportation needs    Medical: Not on file    Non-medical: Not on file  Tobacco Use  . Smoking status: Never Smoker  . Smokeless tobacco: Never Used  Substance and Sexual Activity  . Alcohol use: No  . Drug use: No  . Sexual activity: Not on file  Lifestyle  . Physical activity    Days per week: Not on file    Minutes per session: Not on file  . Stress: Not on file  Relationships  . Social Herbalist on phone: Not on file    Gets together: Not on file    Attends religious service: Not on file    Active member of club or organization: Not on file    Attends meetings of clubs or organizations: Not on file    Relationship status: Not on file  Other Topics Concern  . Not on file  Social  History Narrative  . Not on file     Family History: The patient's family history includes Cancer in his father; Heart failure in his father; Hypertension in his father; Leukemia in his sister. ROS:   Please see the history of present illness.    All 14 point review of systems negative except as described per history of present illness  EKGs/Labs/Other Studies Reviewed:      Recent Labs: No results found for requested labs within last 8760 hours.  Recent Lipid Panel    Component Value Date/Time   CHOL 81 09/21/2016 0440   TRIG 38 09/21/2016 0440   HDL 34 (L) 09/21/2016 0440   CHOLHDL 2.4 09/21/2016 0440   VLDL 8 09/21/2016 0440   LDLCALC 39 09/21/2016 0440    Physical Exam:    VS:  BP 124/76   Pulse 73   Ht 5\' 8"  (1.727 m)   Wt 167 lb (75.8 kg)   SpO2 92%   BMI 25.39 kg/m     Wt Readings  from Last 3 Encounters:  10/25/18 167 lb (75.8 kg)  03/01/18 172 lb 9.6 oz (78.3 kg)  02/01/18 166 lb (75.3 kg)     GEN:  Well nourished, well developed in no acute distress HEENT: Normal NECK: No JVD; No carotid bruits LYMPHATICS: No lymphadenopathy CARDIAC: RRR, no murmurs, no rubs, no gallops RESPIRATORY:  Clear to auscultation without rales, wheezing or rhonchi  ABDOMEN: Soft, non-tender, non-distended MUSCULOSKELETAL:  No edema; No deformity  SKIN: Warm and dry LOWER EXTREMITIES: no swelling NEUROLOGIC:  Alert and oriented x 3 PSYCHIATRIC:  Normal affect   ASSESSMENT:    1. PAF (paroxysmal atrial fibrillation) ([place])   2. Ischemic cardiomyopathy   3. CAD- prior PCIs- cath- moderate CAD 09/22/16   4. Dyslipidemia    PLAN:    In order of problems listed above:  1. Paroxysmal atrial fibrillation.  Anticoagulated with Eliquis which I will continue. 2. Ischemic cardiomyopathy on Cozaar as well as Toprol-XL 25 which I will continue.  I cannot increase beta-blocker because of bradycardia.  Will check his echocardiogram for ejection fraction. 3. Coronary artery disease stable denies have any issues 4. Dyslipidemia he is on Lipitor 40.  We will schedule him to have fasting lipid profile done.   Medication Adjustments/Labs and Tests Ordered: Current medicines are reviewed at length with the patient today.  Concerns regarding medicines are outlined above.  No orders of the defined types were placed in this encounter.  Medication changes: No orders of the defined types were placed in this encounter.   Signed, [place] Liter, MD, [facility] 10/25/2018 2:06 PM    [facility]

## 2018-10-25 NOTE — Patient Instructions (Addendum)
Medication Instructions:  Your physician recommends that you continue on your current medications as directed. Please refer to the Current Medication list given to you today.  If you need a refill on your cardiac medications before your next appointment, please call your pharmacy.   Lab work: Your physician recommends that you return for lab work when you come for your echocadiogram- Fasting Lipid Profile   If you have labs (blood work) drawn today and your tests are completely normal, you will receive your results only by: Marland Kitchen MyChart Message (if you have MyChart) OR . A paper copy in the mail If you have any lab test that is abnormal or we need to change your treatment, we will call you to review the results.  Testing/Procedures: Your physician has requested that you have an echocardiogram. Echocardiography is a painless test that uses sound waves to create images of your heart. It provides your doctor with information about the size and shape of your heart and how well your heart's chambers and valves are working. This procedure takes approximately one hour. There are no restrictions for this procedure.    Follow-Up: At [facility], you and your health needs are our priority.  As part of our continuing mission to provide you with exceptional heart care, we have created designated Provider Care Teams.  These Care Teams include your primary Cardiologist (physician) and Advanced Practice Providers (APPs -  Physician Assistants and Nurse Practitioners) who all work together to provide you with the care you need, when you need it.  Follow-up with Dr.Krasowski in 5 months

## 2018-11-29 ENCOUNTER — Other Ambulatory Visit: Payer: Self-pay

## 2018-11-29 ENCOUNTER — Ambulatory Visit (INDEPENDENT_AMBULATORY_CARE_PROVIDER_SITE_OTHER): Payer: PPO

## 2018-11-29 DIAGNOSIS — I255 Ischemic cardiomyopathy: Secondary | ICD-10-CM

## 2018-11-29 NOTE — Progress Notes (Signed)
Complete echocardiogram has been performed.  Jimmy Clara Smolen RDCS, RVT 

## 2018-12-01 DIAGNOSIS — R6889 Other general symptoms and signs: Secondary | ICD-10-CM | POA: Diagnosis not present

## 2018-12-01 DIAGNOSIS — Z20828 Contact with and (suspected) exposure to other viral communicable diseases: Secondary | ICD-10-CM | POA: Diagnosis not present

## 2018-12-05 ENCOUNTER — Telehealth: Payer: Self-pay | Admitting: Emergency Medicine

## 2018-12-05 NOTE — Telephone Encounter (Signed)
Left message for patient to return call regarding results  

## 2019-02-08 DIAGNOSIS — L300 Nummular dermatitis: Secondary | ICD-10-CM | POA: Diagnosis not present

## 2019-02-08 DIAGNOSIS — C4442 Squamous cell carcinoma of skin of scalp and neck: Secondary | ICD-10-CM | POA: Diagnosis not present

## 2019-02-08 DIAGNOSIS — L570 Actinic keratosis: Secondary | ICD-10-CM | POA: Diagnosis not present

## 2019-02-08 DIAGNOSIS — L299 Pruritus, unspecified: Secondary | ICD-10-CM | POA: Diagnosis not present

## 2019-03-01 ENCOUNTER — Other Ambulatory Visit: Payer: Self-pay

## 2019-03-01 ENCOUNTER — Ambulatory Visit (INDEPENDENT_AMBULATORY_CARE_PROVIDER_SITE_OTHER): Payer: PPO | Admitting: Cardiology

## 2019-03-01 ENCOUNTER — Encounter: Payer: Self-pay | Admitting: Cardiology

## 2019-03-01 VITALS — BP 136/88 | HR 75 | Ht 68.0 in | Wt 162.8 lb

## 2019-03-01 DIAGNOSIS — R0789 Other chest pain: Secondary | ICD-10-CM

## 2019-03-01 DIAGNOSIS — I255 Ischemic cardiomyopathy: Secondary | ICD-10-CM

## 2019-03-01 DIAGNOSIS — I25110 Atherosclerotic heart disease of native coronary artery with unstable angina pectoris: Secondary | ICD-10-CM | POA: Diagnosis not present

## 2019-03-01 DIAGNOSIS — I4821 Permanent atrial fibrillation: Secondary | ICD-10-CM

## 2019-03-01 HISTORY — DX: Permanent atrial fibrillation: I48.21

## 2019-03-01 NOTE — Progress Notes (Signed)
Cardiology Office Note:    Date:  03/01/2019   ID:  Lonnie Boyd, DOB 08/07/1940, MRN [id number]  PCP:  [name], MD  Cardiologist:  Jenne Campus, MD    Referring MD: [name], MD   Chief Complaint  Patient presents with  . Follow-up    5 MO FU     History of Present Illness:    Lonnie Boyd is a 79 y.o. male with complex past medical history significant for stress-induced cardiomyopathy, coronary artery disease, dyslipidemia comes today to my office for follow-up.  Also history of atrial fibrillation which appears to be permanent.  He is doing well denies have any chest pain, tightness, squeezing, pressure burning chest.  He does what he wants to have no difficulty doing it.  Past Medical History:  Diagnosis Date  . CAD (coronary artery disease)   . Cardiomyopathy ([place])   . Hyperlipemia   . Ventral hernia     Past Surgical History:  Procedure Laterality Date  . KIDNEY SURGERY    . LEFT HEART CATH AND CORONARY ANGIOGRAPHY N/A 09/20/2016   Procedure: LEFT HEART CATH AND CORONARY ANGIOGRAPHY;  Surgeon: Belva Crome, MD;  Location: [place] [facility];  Service: Cardiovascular;  Laterality: N/A;    Current Medications: Current Meds  Medication Sig  . albuterol (PROVENTIL) (2.5 MG/3ML) 0.083% nebulizer solution USE 1 VIAL VIA NEBULIZER 4 TIMES DAILY as needed for shortness of breath  . ALPRAZolam (XANAX) 0.25 MG tablet Take 0.25 mg by mouth 2 (two) times daily as needed. for anxiety  . atorvastatin (LIPITOR) 40 MG tablet TAKE TWO TABLETS BY MOUTH EVERY DAY AT 6 PM  . ELIQUIS 5 MG TABS tablet TAKE ONE TABLET BY MOUTH TWICE DAILY  . losartan (COZAAR) 50 MG tablet TAKE ONE TABLET BY MOUTH DAILY  . metoprolol succinate (TOPROL-XL) 25 MG 24 hr tablet Take 1 tablet (25 mg total) by mouth daily. Take with or immediately following a meal.  . nitroGLYCERIN (NITROSTAT) 0.4 MG SL tablet Place 1 tablet (0.4 mg total) under the tongue every 5 (five) minutes x 3  doses as needed for chest pain.     Allergies:   Patient has no known allergies.   Social History   Socioeconomic History  . Marital status: Married    Spouse name: Not on file  . Number of children: Not on file  . Years of education: Not on file  . Highest education level: Not on file  Occupational History  . Not on file  Tobacco Use  . Smoking status: Never Smoker  . Smokeless tobacco: Never Used  Substance and Sexual Activity  . Alcohol use: No  . Drug use: No  . Sexual activity: Not on file  Other Topics Concern  . Not on file  Social History Narrative  . Not on file   Social Determinants of Health   Financial Resource Strain:   . Difficulty of Paying Living Expenses: Not on file  Food Insecurity:   . Worried About Charity fundraiser in the Last Year: Not on file  . Ran Out of Food in the Last Year: Not on file  Transportation Needs:   . Lack of Transportation (Medical): Not on file  . Lack of Transportation (Non-Medical): Not on file  Physical Activity:   . Days of Exercise per Week: Not on file  . Minutes of Exercise per Session: Not on file  Stress:   . Feeling of Stress : Not  on file  Social Connections:   . Frequency of Communication with Friends and Family: Not on file  . Frequency of Social Gatherings with Friends and Family: Not on file  . Attends Religious Services: Not on file  . Active Member of Clubs or Organizations: Not on file  . Attends Archivist Meetings: Not on file  . Marital Status: Not on file     Family History: The patient's family history includes Cancer in his father; Heart failure in his father; Hypertension in his father; Leukemia in his sister. ROS:   Please see the history of present illness.    All 14 point review of systems negative except as described per history of present illness  EKGs/Labs/Other Studies Reviewed:      Recent Labs: No results found for requested labs within last 8760 hours.  Recent Lipid  Panel    Component Value Date/Time   CHOL 81 09/21/2016 0440   TRIG 38 09/21/2016 0440   HDL 34 (L) 09/21/2016 0440   CHOLHDL 2.4 09/21/2016 0440   VLDL 8 09/21/2016 0440   LDLCALC 39 09/21/2016 0440    Physical Exam:    VS:  BP 136/88   Pulse 75   Ht 5\' 8"  (1.727 m)   Wt 162 lb 12.8 oz (73.8 kg)   SpO2 99%   BMI 24.75 kg/m     Wt Readings from Last 3 Encounters:  03/01/19 162 lb 12.8 oz (73.8 kg)  10/25/18 167 lb (75.8 kg)  03/01/18 172 lb 9.6 oz (78.3 kg)     GEN:  Well nourished, well developed in no acute distress HEENT: Normal NECK: No JVD; No carotid bruits LYMPHATICS: No lymphadenopathy CARDIAC: Irregularly irregular, no murmurs, no rubs, no gallops RESPIRATORY:  Clear to auscultation without rales, wheezing or rhonchi  ABDOMEN: Soft, non-tender, non-distended MUSCULOSKELETAL:  No edema; No deformity  SKIN: Warm and dry LOWER EXTREMITIES: no swelling NEUROLOGIC:  Alert and oriented x 3 PSYCHIATRIC:  Normal affect   ASSESSMENT:    1. CAD- prior PCIs- cath- moderate CAD 09/22/16   2. Ischemic cardiomyopathy   3. Atypical chest pain   4. Permanent atrial fibrillation ([facility])    PLAN:    In order of problems listed above:  1. Coronary artery disease doing well from that point is asymptomatic 2. Ischemic cardiomyopathy with latest ejection fraction 45%.  Continue present medications 3. Permanent atrial fibrillation: Rate controlled anticoagulated which I will continue 4. Atypical chest pain denies having any   Medication Adjustments/Labs and Tests Ordered: Current medicines are reviewed at length with the patient today.  Concerns regarding medicines are outlined above.  No orders of the defined types were placed in this encounter.  Medication changes: No orders of the defined types were placed in this encounter.   Signed, [place] Liter, MD, [facility] 03/01/2019 3:46 PM    [place]

## 2019-03-01 NOTE — Patient Instructions (Signed)
Medication Instructions:  .isntcu  *If you need a refill on your cardiac medications before your next appointment, please call your pharmacy*  Lab Work: None.  If you have labs (blood work) drawn today and your tests are completely normal, you will receive your results only by: Marland Kitchen MyChart Message (if you have MyChart) OR . A paper copy in the mail If you have any lab test that is abnormal or we need to change your treatment, we will call you to review the results.  Testing/Procedures: None.   Follow-Up: At [facility], you and your health needs are our priority.  As part of our continuing mission to provide you with exceptional heart care, we have created designated Provider Care Teams.  These Care Teams include your primary Cardiologist (physician) and Advanced Practice Providers (APPs -  Physician Assistants and Nurse Practitioners) who all work together to provide you with the care you need, when you need it.  Your next appointment:   6 month(s)  The format for your next appointment:   In Person  Provider:   Jenne Campus, MD  Other Instructions

## 2019-04-05 DIAGNOSIS — Z9181 History of falling: Secondary | ICD-10-CM | POA: Diagnosis not present

## 2019-04-05 DIAGNOSIS — Z0000 Encounter for general adult medical examination without abnormal findings: Secondary | ICD-10-CM | POA: Diagnosis not present

## 2019-04-05 DIAGNOSIS — G4733 Obstructive sleep apnea (adult) (pediatric): Secondary | ICD-10-CM | POA: Diagnosis not present

## 2019-04-05 DIAGNOSIS — Z139 Encounter for screening, unspecified: Secondary | ICD-10-CM | POA: Diagnosis not present

## 2019-04-05 DIAGNOSIS — E785 Hyperlipidemia, unspecified: Secondary | ICD-10-CM | POA: Diagnosis not present

## 2019-04-05 DIAGNOSIS — Z1331 Encounter for screening for depression: Secondary | ICD-10-CM | POA: Diagnosis not present

## 2019-05-04 ENCOUNTER — Other Ambulatory Visit: Payer: Self-pay

## 2019-05-04 ENCOUNTER — Ambulatory Visit (INDEPENDENT_AMBULATORY_CARE_PROVIDER_SITE_OTHER): Payer: PPO | Admitting: Pulmonary Disease

## 2019-05-04 ENCOUNTER — Encounter: Payer: Self-pay | Admitting: Pulmonary Disease

## 2019-05-04 DIAGNOSIS — G4733 Obstructive sleep apnea (adult) (pediatric): Secondary | ICD-10-CM

## 2019-05-04 HISTORY — DX: Obstructive sleep apnea (adult) (pediatric): G47.33

## 2019-05-04 NOTE — Patient Instructions (Signed)
Schedule home sleep test 

## 2019-05-04 NOTE — Assessment & Plan Note (Signed)
Given excessive daytime somnolence, narrow pharyngeal exam, witnessed apneas & loud snoring, obstructive sleep apnea is very likely & an overnight polysomnogram will be scheduled as a home study. The pathophysiology of obstructive sleep apnea , it's cardiovascular consequences & modes of treatment including CPAP were discused with the patient in detail & they evidenced understanding.  He would be willing to use a CPAP if needed.  If home sleep test is negative, then will proceed with brain imaging and consider attended sleep study His son Lonnie Boyd seems to be convinced that he has OSA

## 2019-05-04 NOTE — Progress Notes (Signed)
Subjective:    Patient ID: Lonnie Boyd, male    DOB: 11-11-1940, 79 y.o.   MRN: [id number]  HPI  79 year old man presents for evaluation of sleep disordered breathing. He is accompanied by his son Lonnie Boyd who has OSA and is using a CPAP machine. 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 He seems to think that if he gets his 8 to 10 hours of sleep he is fine.  He feels that he has brought on his somnolence problems by himself by sleeping late.  Bedtime can be as late as 1 or 2 in the morning.  Son reports that he has had problems driving, he falls asleep in church. Sleep latency can be 35 minutes, he sleeps on his side with 1 pillow, reports 1 nocturnal awakening but otherwise sleeps soundly until he gets out of bed by 9:52 AM.  He feels sleepy if he wakes up early at 8 AM and feels well rested if he is able to stay in bed till 10 AM, denies dryness of mouth or headaches. There has been no recent weight changes There is no history suggestive of cataplexy, sleep paralysis or parasomnias  He does report falls His son and family stay with him, son Lonnie Boyd corroborates his history today  He takes an anxiety pill at bedtime other than Xanax  Past Medical History:  Diagnosis Date  . CAD (coronary artery disease)   . Cardiomyopathy ([place])   . Hyperlipemia   . Ventral hernia     Past Surgical History:  Procedure Laterality Date  . KIDNEY SURGERY    . LEFT HEART CATH AND CORONARY ANGIOGRAPHY N/A 09/20/2016   Procedure: LEFT HEART CATH AND CORONARY ANGIOGRAPHY;  Surgeon: Belva Crome, MD;  Location: [facility];  Service: Cardiovascular;  Laterality: N/A;    No Known Allergies  Social History    Socioeconomic History  . Marital status: Married    Spouse name: Not on file  . Number of children: Not on file  . Years of education: Not on file  . Highest education level: Not on file  Occupational History  . Not on file  Tobacco Use  . Smoking status: Never Smoker  . Smokeless tobacco: Never Used  Substance and Sexual Activity  . Alcohol use: No  . Drug use: No  . Sexual activity: Not on file  Other Topics Concern  . Not on file  Social History Narrative  . Not on file   Social Determinants of Health   Financial Resource Strain:   . Difficulty of Paying Living Expenses:   Food Insecurity:   . Worried About Charity fundraiser in the Last Year:   . Arboriculturist in the Last Year:   Transportation Needs:   . Film/video editor (Medical):   Marland Kitchen Lack of Transportation (Non-Medical):   Physical Activity:   . Days of Exercise per Week:   . Minutes of Exercise per Session:   Stress:   . Feeling of Stress :   Social Connections:   . Frequency of Communication with Friends and Family:   . Frequency of Social Gatherings with Friends and Family:   . Attends Religious Services:   .  Active Member of Clubs or Organizations:   . Attends Archivist Meetings:   Marland Kitchen Marital Status:   Intimate Partner Violence:   . Fear of Current or Ex-Partner:   . Emotionally Abused:   Marland Kitchen Physically Abused:   . Sexually Abused:        Family History  Problem Relation Age of Onset  . Cancer Father   . Hypertension Father   . Heart failure Father   . Leukemia Sister      Review of Systems Constitutional: negative for anorexia, fevers and sweats  Eyes: negative for irritation, redness and visual disturbance  Ears, nose, mouth, throat, and face: negative for earaches, epistaxis, nasal congestion and sore throat  Respiratory: negative for cough, dyspnea on exertion, sputum and wheezing  Cardiovascular: negative for chest pain, dyspnea, lower extremity edema, orthopnea,  palpitations and syncope  Gastrointestinal: negative for abdominal pain, constipation, diarrhea, melena, nausea and vomiting  Genitourinary:negative for dysuria, frequency and hematuria  Hematologic/lymphatic: negative for bleeding, easy bruising and lymphadenopathy  Musculoskeletal:negative for arthralgias, muscle weakness and stiff joints  Neurological: negative for coordination problems, gait problems, headaches and weakness  Endocrine: negative for diabetic symptoms including polydipsia, polyuria and weight loss     Objective:   Physical Exam  Gen. Pleasant,, well-nourished, in no distress, normal affect ENT - no pallor,icterus, no post nasal drip Neck: No JVD, no thyromegaly, no carotid bruits Lungs: no use of accessory muscles, no dullness to percussion, clear without rales or rhonchi  Cardiovascular: Rhythm regular, heart sounds  normal, no murmurs or gallops, no peripheral edema Abdomen: soft and non-tender, no hepatosplenomegaly, BS normal. Musculoskeletal: No deformities, no cyanosis or clubbing Neuro:  alert, non focal       Assessment & Plan:

## 2019-05-09 ENCOUNTER — Other Ambulatory Visit: Payer: Self-pay | Admitting: Cardiology

## 2019-05-22 ENCOUNTER — Other Ambulatory Visit: Payer: Self-pay

## 2019-05-22 ENCOUNTER — Ambulatory Visit: Payer: PPO

## 2019-05-22 DIAGNOSIS — G4733 Obstructive sleep apnea (adult) (pediatric): Secondary | ICD-10-CM

## 2019-05-29 ENCOUNTER — Telehealth: Payer: Self-pay | Admitting: Pulmonary Disease

## 2019-05-29 DIAGNOSIS — G4733 Obstructive sleep apnea (adult) (pediatric): Secondary | ICD-10-CM | POA: Diagnosis not present

## 2019-05-29 NOTE — Telephone Encounter (Signed)
HST showed mod OSA with AHI 28/ hr Suggest CPAP titration study - this will give him a chance to adjust to CPAP better   Alternatively, can start autoCPAP  5-15 cm, mask of choice OV with me/APP in 6 wks

## 2019-05-30 NOTE — Telephone Encounter (Signed)
Called pt and advised message from the provider. Pt understood and verbalized understanding. Nothing further is needed.   Cpap titration ordered.

## 2019-06-11 ENCOUNTER — Telehealth: Payer: Self-pay | Admitting: Pulmonary Disease

## 2019-06-11 NOTE — Telephone Encounter (Signed)
Pt has covid test sched for 6/5 @ 11:45 & cpap titration sched for 6/8 @ 8 PM.  Provided appt info.  Nothing further needed at this time.

## 2019-06-11 NOTE — Telephone Encounter (Signed)
lmtcb Lonnie Boyd ° °

## 2019-06-13 ENCOUNTER — Other Ambulatory Visit: Payer: Self-pay | Admitting: Cardiology

## 2019-06-20 ENCOUNTER — Other Ambulatory Visit: Payer: Self-pay | Admitting: Cardiology

## 2019-06-20 ENCOUNTER — Telehealth: Payer: Self-pay | Admitting: Cardiology

## 2019-06-20 NOTE — Telephone Encounter (Signed)
Called patient explained why he needs to be on eliquis with a history of atrial fibrillation patient verbally understood and thanked me for the call.

## 2019-06-20 NOTE — Telephone Encounter (Signed)
New message   Patient would like to know what the Eliquis that he is taking if for. Please advise.

## 2019-06-30 ENCOUNTER — Other Ambulatory Visit (HOSPITAL_COMMUNITY)
Admission: RE | Admit: 2019-06-30 | Discharge: 2019-06-30 | Disposition: A | Discharge status: Home / Self Care | Payer: PPO | Source: Ambulatory Visit | Attending: Pulmonary Disease | Admitting: Pulmonary Disease

## 2019-06-30 DIAGNOSIS — Z20822 Contact with and (suspected) exposure to covid-19: Secondary | ICD-10-CM | POA: Diagnosis not present

## 2019-06-30 DIAGNOSIS — Z01812 Encounter for preprocedural laboratory examination: Secondary | ICD-10-CM | POA: Insufficient documentation

## 2019-06-30 LAB — SARS CORONAVIRUS 2 (TAT 6-24 HRS): SARS Coronavirus 2: NEGATIVE

## 2019-07-03 ENCOUNTER — Other Ambulatory Visit: Payer: Self-pay

## 2019-07-03 ENCOUNTER — Ambulatory Visit (HOSPITAL_BASED_OUTPATIENT_CLINIC_OR_DEPARTMENT_OTHER): Payer: PPO | Attending: Pulmonary Disease | Admitting: Pulmonary Disease

## 2019-07-03 DIAGNOSIS — G4733 Obstructive sleep apnea (adult) (pediatric): Secondary | ICD-10-CM | POA: Diagnosis not present

## 2019-07-12 ENCOUNTER — Telehealth: Payer: Self-pay | Admitting: Pulmonary Disease

## 2019-07-12 DIAGNOSIS — G4733 Obstructive sleep apnea (adult) (pediatric): Secondary | ICD-10-CM | POA: Diagnosis not present

## 2019-07-12 NOTE — Telephone Encounter (Signed)
Based on titration study, pl send Rx for   CPAP 14 cm H2O with a Small size Fisher&Paykel Full Face Mask Simplus mask and heated humidification. He can call back if he feels pressure too high  OV with APP in 6 wks

## 2019-07-12 NOTE — Telephone Encounter (Signed)
Called pt and advised message from the provider. Pt understood and verbalized understanding. Nothing further is needed.   Rx for CPAP sent .

## 2019-07-12 NOTE — Procedures (Signed)
Patient Name: Lonnie Boyd, Lonnie Boyd Date: 07/03/2019 Gender: Male D.O.B: 1940/04/03 Age (years): 20 Referring Provider: Kara Mead MD, ABSM Height (inches): 68 Interpreting Physician: Kara Mead MD, ABSM Weight (lbs): 170 RPSGT: Carolin Coy BMI: 26 MRN: [id number] Neck Size: 15.50 <br> <br> CLINICAL INFORMATION The patient is referred for a CPAP titration to treat sleep apnea.    Date of  HST: 05/2019 showed mod OSA with AHI 28/ hr  SLEEP STUDY TECHNIQUE As per the AASM Manual for the Scoring of Sleep and Associated Events v2.3 (April 2016) with a hypopnea requiring 4% desaturations.  The channels recorded and monitored were frontal, central and occipital EEG, electrooculogram (EOG), submentalis EMG (chin), nasal and oral airflow, thoracic and abdominal wall motion, anterior tibialis EMG, snore microphone, electrocardiogram, and pulse oximetry. Continuous positive airway pressure (CPAP) was initiated at the beginning of the study and titrated to treat sleep-disordered breathing.  MEDICATIONS Medications self-administered by patient taken the night of the study : XANAX, ESCITALOPRAM OXALATE, eliquis  TECHNICIAN COMMENTS Comments added by technician: PATIENT WAS ORDERED AS A CPAP TITRATION. Patient was restless all through the night. Comments added by scorer: N/A   RESPIRATORY PARAMETERS Optimal PAP Pressure (cm): 14 AHI at Optimal Pressure (/hr): 0.0 Overall Minimal O2 (%): 90.0 Supine % at Optimal Pressure (%): 0 Minimal O2 at Optimal Pressure (%): 93.0   SLEEP ARCHITECTURE The study was initiated at 10:20:32 PM and ended at 4:32:40 AM.  Sleep onset time was 0.7 minutes and the sleep efficiency was 55.7%%. The total sleep time was 207.5 minutes.  The patient spent 27.0%% of the night in stage N1 sleep, 73.0%% in stage N2 sleep, 0.0%% in stage N3 and 0% in REM.Stage REM latency was N/A minutes  Wake after sleep onset was 164.0. Alpha intrusion was absent. Supine sleep  was 30.39%.  CARDIAC DATA The 2 lead EKG demonstrated atrial fibrillation. The mean heart rate was 65.9 beats per minute. Other EKG findings include: PVCs.   LEG MOVEMENT DATA The total Periodic Limb Movements of Sleep (PLMS) were 0. The PLMS index was 0.0. A PLMS index of <15 is considered normal in adults.  IMPRESSIONS - The optimal PAP pressure was 14 cm of water. - Central sleep apnea was not noted during this titration (CAI = 0.0/h). - Significant oxygen desaturations were not observed during this titration (min O2 = 90.0%). - The patient snored with soft snoring volume during this titration study. - 2-lead EKG demonstrated: PVCs - Clinically significant periodic limb movements were not noted during this study. Arousals associated with LMs were significant.   DIAGNOSIS - Obstructive Sleep Apnea (327.23 [G47.33 ICD-10]) - Significant Limb Movement During Sleep (327.51 [G47.61 ICD-10])   RECOMMENDATIONS - Trial of CPAP therapy on 14 cm H2O with a Small size Fisher&Paykel Full Face Mask Simplus mask and heated humidification. - Avoid alcohol, sedatives and other CNS depressants that may worsen sleep apnea and disrupt normal sleep architecture. - Sleep hygiene should be reviewed to assess factors that may improve sleep quality. - Weight management and regular exercise should be initiated or continued. - Return to [facility] Center for re-evaluation after 4 weeks of therapy   Kara Mead MD Board Certified in [place]

## 2019-07-23 DIAGNOSIS — S61213A Laceration without foreign body of left middle finger without damage to nail, initial encounter: Secondary | ICD-10-CM | POA: Diagnosis not present

## 2019-07-23 DIAGNOSIS — S61412A Laceration without foreign body of left hand, initial encounter: Secondary | ICD-10-CM | POA: Diagnosis not present

## 2019-07-31 DIAGNOSIS — G4733 Obstructive sleep apnea (adult) (pediatric): Secondary | ICD-10-CM | POA: Diagnosis not present

## 2019-08-24 DIAGNOSIS — H6123 Impacted cerumen, bilateral: Secondary | ICD-10-CM | POA: Diagnosis not present

## 2019-08-24 DIAGNOSIS — E538 Deficiency of other specified B group vitamins: Secondary | ICD-10-CM | POA: Diagnosis not present

## 2019-08-24 DIAGNOSIS — R413 Other amnesia: Secondary | ICD-10-CM | POA: Diagnosis not present

## 2019-08-30 DIAGNOSIS — G4733 Obstructive sleep apnea (adult) (pediatric): Secondary | ICD-10-CM | POA: Diagnosis not present

## 2019-08-31 DIAGNOSIS — G4733 Obstructive sleep apnea (adult) (pediatric): Secondary | ICD-10-CM | POA: Diagnosis not present

## 2019-09-04 ENCOUNTER — Ambulatory Visit (INDEPENDENT_AMBULATORY_CARE_PROVIDER_SITE_OTHER): Payer: PPO | Admitting: Cardiology

## 2019-09-04 ENCOUNTER — Encounter: Payer: Self-pay | Admitting: Cardiology

## 2019-09-04 ENCOUNTER — Other Ambulatory Visit: Payer: Self-pay

## 2019-09-04 VITALS — BP 116/74 | HR 76 | Ht 68.0 in | Wt 161.8 lb

## 2019-09-04 DIAGNOSIS — F0390 Unspecified dementia without behavioral disturbance: Secondary | ICD-10-CM | POA: Insufficient documentation

## 2019-09-04 DIAGNOSIS — G301 Alzheimer's disease with late onset: Secondary | ICD-10-CM | POA: Diagnosis not present

## 2019-09-04 DIAGNOSIS — I255 Ischemic cardiomyopathy: Secondary | ICD-10-CM | POA: Diagnosis not present

## 2019-09-04 DIAGNOSIS — F0280 Dementia in other diseases classified elsewhere without behavioral disturbance: Secondary | ICD-10-CM

## 2019-09-04 DIAGNOSIS — I25110 Atherosclerotic heart disease of native coronary artery with unstable angina pectoris: Secondary | ICD-10-CM

## 2019-09-04 DIAGNOSIS — E785 Hyperlipidemia, unspecified: Secondary | ICD-10-CM | POA: Diagnosis not present

## 2019-09-04 HISTORY — DX: Unspecified dementia, unspecified severity, without behavioral disturbance, psychotic disturbance, mood disturbance, and anxiety: F03.90

## 2019-09-04 NOTE — Patient Instructions (Signed)
Medication Instructions:  °Your physician recommends that you continue on your current medications as directed. Please refer to the Current Medication list given to you today. ° °*If you need a refill on your cardiac medications before your next appointment, please call your pharmacy* ° ° °Lab Work: °None. ° °If you have labs (blood work) drawn today and your tests are completely normal, you will receive your results only by: °• MyChart Message (if you have MyChart) OR °• A paper copy in the mail °If you have any lab test that is abnormal or we need to change your treatment, we will call you to review the results. ° ° °Testing/Procedures: °Your physician has requested that you have an echocardiogram. Echocardiography is a painless test that uses sound waves to create images of your heart. It provides your doctor with information about the size and shape of your heart and how well your heart’s chambers and valves are working. This procedure takes approximately one hour. There are no restrictions for this procedure. ° ° ° ° °Follow-Up: °At [facility], you and your health needs are our priority.  As part of our continuing mission to provide you with exceptional heart care, we have created designated Provider Care Teams.  These Care Teams include your primary Cardiologist (physician) and Advanced Practice Providers (APPs -  Physician Assistants and Nurse Practitioners) who all work together to provide you with the care you need, when you need it. ° °We recommend signing up for the patient portal called "MyChart".  Sign up information is provided on this After Visit Summary.  MyChart is used to connect with patients for Virtual Visits (Telemedicine).  Patients are able to view lab/test results, encounter notes, upcoming appointments, etc.  Non-urgent messages can be sent to your provider as well.   °To learn more about what you can do with MyChart, go to [email or web address].   ° °Your next appointment:   °6  month(s) ° °The format for your next appointment:   °In Person ° °Provider:   °Robert Krasowski, MD ° ° °Other Instructions ° ° °Echocardiogram °An echocardiogram is a procedure that uses painless sound waves (ultrasound) to produce an image of the heart. Images from an echocardiogram can provide important information about: °· Signs of coronary artery disease (CAD). °· Aneurysm detection. An aneurysm is a weak or damaged part of an artery wall that bulges out from the normal force of blood pumping through the body. °· Heart size and shape. Changes in the size or shape of the heart can be associated with certain conditions, including heart failure, aneurysm, and CAD. °· Heart muscle function. °· Heart valve function. °· Signs of a past heart attack. °· Fluid buildup around the heart. °· Thickening of the heart muscle. °· A tumor or infectious growth around the heart valves. °Tell a health care provider about: °· Any allergies you have. °· All medicines you are taking, including vitamins, herbs, eye drops, creams, and over-the-counter medicines. °· Any blood disorders you have. °· Any surgeries you have had. °· Any medical conditions you have. °· Whether you are pregnant or may be pregnant. °What are the risks? °Generally, this is a safe procedure. However, problems may occur, including: °· Allergic reaction to dye (contrast) that may be used during the procedure. °What happens before the procedure? °No specific preparation is needed. You may eat and drink normally. °What happens during the procedure? ° °· An IV tube may be inserted into one of your veins. °· You may   receive contrast through this tube. A contrast is an injection that improves the quality of the pictures from your heart. °· A gel will be applied to your chest. °· A wand-like tool (transducer) will be moved over your chest. The gel will help to transmit the sound waves from the transducer. °· The sound waves will harmlessly bounce off of your heart to  allow the heart images to be captured in real-time motion. The images will be recorded on a computer. °The procedure may vary among health care providers and hospitals. °What happens after the procedure? °· You may return to your normal, everyday life, including diet, activities, and medicines, unless your health care provider tells you not to do that. °Summary °· An echocardiogram is a procedure that uses painless sound waves (ultrasound) to produce an image of the heart. °· Images from an echocardiogram can provide important information about the size and shape of your heart, heart muscle function, heart valve function, and fluid buildup around your heart. °· You do not need to do anything to prepare before this procedure. You may eat and drink normally. °· After the echocardiogram is completed, you may return to your normal, everyday life, unless your health care provider tells you not to do that. °This information is not intended to replace advice given to you by your health care provider. Make sure you discuss any questions you have with your health care provider. °Document Revised: 05/04/2018 Document Reviewed: 02/14/2016 °Elsevier Patient Education © 2020 Elsevier Inc. ° ° °

## 2019-09-04 NOTE — Progress Notes (Signed)
Cardiology Office Note:    Date:  09/04/2019   ID:  Lonnie Boyd, DOB 12/18/1940, MRN [id number]  PCP:  [name], MD  Cardiologist:  Jenne Campus, MD    Referring MD: [name], MD   No chief complaint on file. Doing fine  History of Present Illness:    Lonnie Boyd is a 79 y.o. male with past medical history significant for stress-induced cardiomyopathy, cardiac arrest, coronary artery disease which is moderate by cardiac cath in 2018, dyslipidemia.  He comes today 2 months of follow-up overall doing well.  He denies have any chest pain tightness squeezing pressure burning chest no shortness of breath.  He said that he is developing Alzheimer dementia.  When asking what are the signs and symptoms of this he said he is very easy for him to forget things and sometimes he does not know how to get to certain places when he drives.  Past Medical History:  Diagnosis Date  . CAD (coronary artery disease)   . Cardiomyopathy ([place])   . Hyperlipemia   . Ventral hernia     Past Surgical History:  Procedure Laterality Date  . KIDNEY SURGERY    . LEFT HEART CATH AND CORONARY ANGIOGRAPHY N/A 09/20/2016   Procedure: LEFT HEART CATH AND CORONARY ANGIOGRAPHY;  Surgeon: Belva Crome, MD;  Location: [facility];  Service: Cardiovascular;  Laterality: N/A;    Current Medications: Current Meds  Medication Sig  . albuterol (PROVENTIL) (2.5 MG/3ML) 0.083% nebulizer solution USE 1 VIAL VIA NEBULIZER 4 TIMES DAILY as needed for shortness of breath  . ALPRAZolam (XANAX) 0.25 MG tablet Take 0.25 mg by mouth 2 (two) times daily as needed. for anxiety  . atorvastatin (LIPITOR) 40 MG tablet TAKE TWO TABLETS BY MOUTH EVERY DAY AT 6 PM  . donepezil (ARICEPT) 5 MG tablet Take 5 mg by mouth at bedtime.  Marland Kitchen ELIQUIS 5 MG TABS tablet TAKE ONE TABLET BY MOUTH TWICE DAILY  . escitalopram (LEXAPRO) 10 MG tablet Take 10 mg by mouth daily.  Marland Kitchen losartan (COZAAR) 50 MG tablet TAKE ONE  TABLET BY MOUTH DAILY  . metoprolol succinate (TOPROL-XL) 25 MG 24 hr tablet Take 1 tablet (25 mg total) by mouth daily. Take with or immediately following a meal.  . nitroGLYCERIN (NITROSTAT) 0.4 MG SL tablet Place 1 tablet (0.4 mg total) under the tongue every 5 (five) minutes x 3 doses as needed for chest pain.     Allergies:   Patient has no known allergies.   Social History   Socioeconomic History  . Marital status: Married    Spouse name: Not on file  . Number of children: Not on file  . Years of education: Not on file  . Highest education level: Not on file  Occupational History  . Not on file  Tobacco Use  . Smoking status: Never Smoker  . Smokeless tobacco: Never Used  Vaping Use  . Vaping Use: Never used  Substance and Sexual Activity  . Alcohol use: No  . Drug use: No  . Sexual activity: Not on file  Other Topics Concern  . Not on file  Social History Narrative  . Not on file   Social Determinants of Health   Financial Resource Strain:   . Difficulty of Paying Living Expenses:   Food Insecurity:   . Worried About Charity fundraiser in the Last Year:   . [place] in the Last Year:  Transportation Needs:   . Film/video editor (Medical):   Marland Kitchen Lack of Transportation (Non-Medical):   Physical Activity:   . Days of Exercise per Week:   . Minutes of Exercise per Session:   Stress:   . Feeling of Stress :   Social Connections:   . Frequency of Communication with Friends and Family:   . Frequency of Social Gatherings with Friends and Family:   . Attends Religious Services:   . Active Member of Clubs or Organizations:   . Attends Archivist Meetings:   Marland Kitchen Marital Status:      Family History: The patient's family history includes Cancer in his father; Heart failure in his father; Hypertension in his father; Leukemia in his sister. ROS:   Please see the history of present illness.    All 14 point review of systems negative except as  described per history of present illness  EKGs/Labs/Other Studies Reviewed:      Recent Labs: No results found for requested labs within last 8760 hours.  Recent Lipid Panel    Component Value Date/Time   CHOL 81 09/21/2016 0440   TRIG 38 09/21/2016 0440   HDL 34 (L) 09/21/2016 0440   CHOLHDL 2.4 09/21/2016 0440   VLDL 8 09/21/2016 0440   LDLCALC 39 09/21/2016 0440    Physical Exam:    VS:  BP 116/74   Pulse 76   Ht 5\' 8"  (1.727 m)   Wt 161 lb 12.8 oz (73.4 kg)   SpO2 95%   BMI 24.60 kg/m     Wt Readings from Last 3 Encounters:  09/04/19 161 lb 12.8 oz (73.4 kg)  07/03/19 170 lb (77.1 kg)  05/04/19 167 lb 9.6 oz (76 kg)     GEN:  Well nourished, well developed in no acute distress HEENT: Normal NECK: No JVD; No carotid bruits LYMPHATICS: No lymphadenopathy CARDIAC: RRR, no murmurs, no rubs, no gallops RESPIRATORY:  Clear to auscultation without rales, wheezing or rhonchi  ABDOMEN: Soft, non-tender, non-distended MUSCULOSKELETAL:  No edema; No deformity  SKIN: Warm and dry LOWER EXTREMITIES: no swelling NEUROLOGIC:  Alert and oriented x 3 PSYCHIATRIC:  Normal affect   ASSESSMENT:    1. Ischemic cardiomyopathy   2. CAD- prior PCIs- cath- moderate CAD 09/22/16   3. Dyslipidemia   4. Late onset 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 ([place])    PLAN:    In order of problems listed above:  1. Ischemic cardiomyopathy.  He is on beta-blocker as well as ARB which I will continue.  I will recheck his left ventricle ejection fraction.  He does have history of stress related cardiomyopathy however improvement to almost normal ejection.  He did not have critical coronary artery disease. 2. Coronary disease stable, denies have any chest pain tightness squeezing pressure burning chest. 3. Paroxysmal atrial fibrillation: On Eliquis which I will continue.  We will continue beta-blocker. 4. Dementia.  Likely mild. 5. Dyslipidemia: He is taking Lipitor 40 which I  will continue.  I did review his K PN however from April 07, 2018 showing LDL of 166.  I will ask him to have another fasting lipid profile done.   Medication Adjustments/Labs and Tests Ordered: Current medicines are reviewed at length with the patient today.  Concerns regarding medicines are outlined above.  No orders of the defined types were placed in this encounter.  Medication changes: No orders of the defined types were placed in this encounter.   Signed, Park Liter, MD,  [facility] 09/04/2019 2:58 PM    [facility]

## 2019-09-24 DIAGNOSIS — R413 Other amnesia: Secondary | ICD-10-CM | POA: Diagnosis not present

## 2019-10-01 DIAGNOSIS — G4733 Obstructive sleep apnea (adult) (pediatric): Secondary | ICD-10-CM | POA: Diagnosis not present

## 2019-10-02 ENCOUNTER — Other Ambulatory Visit: Payer: Self-pay | Admitting: Cardiology

## 2019-10-08 DIAGNOSIS — G4733 Obstructive sleep apnea (adult) (pediatric): Secondary | ICD-10-CM | POA: Diagnosis not present

## 2019-10-24 DIAGNOSIS — H353131 Nonexudative age-related macular degeneration, bilateral, early dry stage: Secondary | ICD-10-CM | POA: Diagnosis not present

## 2019-10-24 DIAGNOSIS — H04123 Dry eye syndrome of bilateral lacrimal glands: Secondary | ICD-10-CM | POA: Diagnosis not present

## 2019-10-25 DIAGNOSIS — Z23 Encounter for immunization: Secondary | ICD-10-CM | POA: Diagnosis not present

## 2019-10-25 DIAGNOSIS — R413 Other amnesia: Secondary | ICD-10-CM | POA: Diagnosis not present

## 2019-11-26 DEATH — deceased

## 2019-12-05 ENCOUNTER — Other Ambulatory Visit: Payer: Self-pay

## 2019-12-05 ENCOUNTER — Ambulatory Visit (INDEPENDENT_AMBULATORY_CARE_PROVIDER_SITE_OTHER): Payer: PPO

## 2019-12-05 DIAGNOSIS — G301 Alzheimer's disease with late onset: Secondary | ICD-10-CM | POA: Diagnosis not present

## 2019-12-05 DIAGNOSIS — F0280 Dementia in other diseases classified elsewhere without behavioral disturbance: Secondary | ICD-10-CM | POA: Diagnosis not present

## 2019-12-05 DIAGNOSIS — I25110 Atherosclerotic heart disease of native coronary artery with unstable angina pectoris: Secondary | ICD-10-CM

## 2019-12-05 LAB — ECHOCARDIOGRAM COMPLETE
Area-P 1/2: 3.12 cm2
Calc EF: 44.4 %
S' Lateral: 5 cm
Single Plane A2C EF: 47.9 %
Single Plane A4C EF: 44.9 %

## 2019-12-05 NOTE — Progress Notes (Signed)
Complete echocardiogram performed.  Jimmy Loise Esguerra RDCS, RVT  

## 2020-02-06 DIAGNOSIS — I4891 Unspecified atrial fibrillation: Secondary | ICD-10-CM | POA: Diagnosis not present

## 2020-02-06 DIAGNOSIS — N182 Chronic kidney disease, stage 2 (mild): Secondary | ICD-10-CM | POA: Diagnosis not present

## 2020-02-06 DIAGNOSIS — I252 Old myocardial infarction: Secondary | ICD-10-CM | POA: Diagnosis not present

## 2020-02-06 DIAGNOSIS — I129 Hypertensive chronic kidney disease with stage 1 through stage 4 chronic kidney disease, or unspecified chronic kidney disease: Secondary | ICD-10-CM | POA: Diagnosis not present

## 2020-02-06 DIAGNOSIS — J449 Chronic obstructive pulmonary disease, unspecified: Secondary | ICD-10-CM | POA: Diagnosis not present

## 2020-02-06 DIAGNOSIS — R0602 Shortness of breath: Secondary | ICD-10-CM | POA: Diagnosis not present

## 2020-02-06 DIAGNOSIS — R918 Other nonspecific abnormal finding of lung field: Secondary | ICD-10-CM | POA: Diagnosis not present

## 2020-02-06 DIAGNOSIS — R0902 Hypoxemia: Secondary | ICD-10-CM | POA: Diagnosis not present

## 2020-02-06 DIAGNOSIS — U071 COVID-19: Secondary | ICD-10-CM | POA: Diagnosis not present

## 2020-02-06 DIAGNOSIS — I2510 Atherosclerotic heart disease of native coronary artery without angina pectoris: Secondary | ICD-10-CM | POA: Diagnosis not present

## 2020-03-04 ENCOUNTER — Other Ambulatory Visit: Payer: Self-pay

## 2020-03-04 DIAGNOSIS — I2510 Atherosclerotic heart disease of native coronary artery without angina pectoris: Secondary | ICD-10-CM | POA: Insufficient documentation

## 2020-03-04 DIAGNOSIS — E785 Hyperlipidemia, unspecified: Secondary | ICD-10-CM | POA: Insufficient documentation

## 2020-03-04 DIAGNOSIS — K439 Ventral hernia without obstruction or gangrene: Secondary | ICD-10-CM | POA: Insufficient documentation

## 2020-03-12 ENCOUNTER — Ambulatory Visit: Payer: PPO | Admitting: Cardiology

## 2020-03-12 ENCOUNTER — Other Ambulatory Visit: Payer: Self-pay

## 2020-03-12 ENCOUNTER — Encounter: Payer: Self-pay | Admitting: Cardiology

## 2020-03-12 VITALS — BP 144/80 | HR 75 | Ht 68.0 in | Wt 166.0 lb

## 2020-03-12 DIAGNOSIS — I4821 Permanent atrial fibrillation: Secondary | ICD-10-CM

## 2020-03-12 DIAGNOSIS — I25110 Atherosclerotic heart disease of native coronary artery with unstable angina pectoris: Secondary | ICD-10-CM

## 2020-03-12 DIAGNOSIS — I252 Old myocardial infarction: Secondary | ICD-10-CM | POA: Diagnosis not present

## 2020-03-12 DIAGNOSIS — J4540 Moderate persistent asthma, uncomplicated: Secondary | ICD-10-CM | POA: Diagnosis not present

## 2020-03-12 DIAGNOSIS — I255 Ischemic cardiomyopathy: Secondary | ICD-10-CM

## 2020-03-12 DIAGNOSIS — I2510 Atherosclerotic heart disease of native coronary artery without angina pectoris: Secondary | ICD-10-CM | POA: Diagnosis not present

## 2020-03-12 DIAGNOSIS — Z79899 Other long term (current) drug therapy: Secondary | ICD-10-CM | POA: Diagnosis not present

## 2020-03-12 DIAGNOSIS — G301 Alzheimer's disease with late onset: Secondary | ICD-10-CM | POA: Diagnosis not present

## 2020-03-12 DIAGNOSIS — E785 Hyperlipidemia, unspecified: Secondary | ICD-10-CM | POA: Diagnosis not present

## 2020-03-12 DIAGNOSIS — I480 Paroxysmal atrial fibrillation: Secondary | ICD-10-CM | POA: Diagnosis not present

## 2020-03-12 DIAGNOSIS — F0280 Dementia in other diseases classified elsewhere without behavioral disturbance: Secondary | ICD-10-CM | POA: Diagnosis not present

## 2020-03-12 NOTE — Progress Notes (Signed)
Cardiology Office Note:    Date:  03/12/2020   ID:  Lonnie Boyd, DOB [birth year], MRN [id number]  PCP:  [name], MD  Cardiologist:  Jenne Campus, MD    Referring MD: [name], MD   Chief Complaint  Patient presents with  . Follow-up  I am doing fine  History of Present Illness:    Lonnie Boyd is a 80 y.o. male with past medical history significant for cardiomyopathy initially stress-induced coronary arteries did not show significant stenosis based on cardiac cath from 2018, dyslipidemia.  He is a stress-induced cardiomyopathy was secondary to passing of her his wife because of dementia, he took this very hard.  Luckily his ejection fraction recovered.  Last echocardiogram showed ejection fraction 45 to 50%.  With complicated situation quite significantly is the 5 that he is developing dementia.  He is a son with his children moved into his house and taking care of him.  He denies have any symptoms there is no chest pain tightness squeezing pressure burning chest no dizziness he said I am doing fine.  Past Medical History:  Diagnosis Date  . Acute blood loss anemia   . Anemia-HEME positive   . CAD (coronary artery disease)   . CAD- prior PCIs- cath- moderate CAD 09/22/16   . Cardiac arrest with ventricular fibrillation ([place])   . Cardiomyopathy ([place])   . Dementia (Lonnie Boyd) 09/04/2019  . Dizziness 06/23/2017  . Dyslipidemia 05/15/2015  . Falls 05/24/2017  . Gastrointestinal hemorrhage   . Hyperlipemia   . Ischemic cardiomyopathy   . Multiple closed fractures of ribs of both sides   . NSTEMI (non-ST elevated myocardial infarction) (Lonnie Boyd) 09/20/2016  . OSA (obstructive sleep apnea) 05/04/2019  . PAF (paroxysmal atrial fibrillation) (Lonnie Boyd) 09/25/2016  . Permanent atrial fibrillation ([place]) 03/01/2019  . Torsades de pointes (Lonnie Boyd)   . Ventral hernia     Past Surgical History:  Procedure Laterality Date  . KIDNEY SURGERY    . LEFT HEART CATH AND CORONARY ANGIOGRAPHY  N/A 09/20/2016   Procedure: LEFT HEART CATH AND CORONARY ANGIOGRAPHY;  Surgeon: Belva Crome, MD;  Location: [facility];  Service: Cardiovascular;  Laterality: N/A;    Current Medications: Current Meds  Medication Sig  . albuterol (PROVENTIL) (2.5 MG/3ML) 0.083% nebulizer solution USE 1 VIAL VIA NEBULIZER 4 TIMES DAILY as needed for shortness of breath  . ALPRAZolam (XANAX) 0.25 MG tablet Take 0.25 mg by mouth 2 (two) times daily as needed. for anxiety  . atorvastatin (LIPITOR) 40 MG tablet TAKE TWO TABLETS BY MOUTH EVERY DAY AT 6 PM  . donepezil (ARICEPT) 5 MG tablet Take 5 mg by mouth at bedtime.  Marland Kitchen ELIQUIS 5 MG TABS tablet TAKE ONE TABLET BY MOUTH TWICE DAILY  . escitalopram (LEXAPRO) 10 MG tablet Take 10 mg by mouth daily.  Marland Kitchen losartan (COZAAR) 50 MG tablet TAKE ONE TABLET BY MOUTH DAILY  . memantine (NAMENDA) 5 MG tablet Take 5 mg by mouth 2 (two) times daily.  . metoprolol succinate (TOPROL-XL) 25 MG 24 hr tablet Take 1 tablet (25 mg total) by mouth daily. Take with or immediately following a meal.  . nitroGLYCERIN (NITROSTAT) 0.4 MG SL tablet Place 1 tablet (0.4 mg total) under the tongue every 5 (five) minutes x 3 doses as needed for chest pain.     Allergies:   Patient has no known allergies.   Social History   Socioeconomic History  . Marital status: Married  Spouse name: Not on file  . Number of children: Not on file  . Years of education: Not on file  . Highest education level: Not on file  Occupational History  . Not on file  Tobacco Use  . Smoking status: Never Smoker  . Smokeless tobacco: Never Used  Vaping Use  . Vaping Use: Never used  Substance and Sexual Activity  . Alcohol use: No  . Drug use: No  . Sexual activity: Not on file  Other Topics Concern  . Not on file  Social History Narrative  . Not on file   Social Determinants of Health   Financial Resource Strain: Not on file  Food Insecurity: Not on file  Transportation Needs: Not on  file  Physical Activity: Not on file  Stress: Not on file  Social Connections: Not on file     Family History: The patient's family history includes Cancer in his father; Heart failure in his father; Hypertension in his father; Leukemia in his sister. ROS:   Please see the history of present illness.    All 14 point review of systems negative except as described per history of present illness  EKGs/Labs/Other Studies Reviewed:      Recent Labs: No results found for requested labs within last 8760 hours.  Recent Lipid Panel    Component Value Date/Time   CHOL 81 09/21/2016 0440   TRIG 38 09/21/2016 0440   HDL 34 (L) 09/21/2016 0440   CHOLHDL 2.4 09/21/2016 0440   VLDL 8 09/21/2016 0440   LDLCALC 39 09/21/2016 0440    Physical Exam:    VS:  BP (!) 144/80 (BP Location: Right Arm, Patient Position: Sitting)   Pulse 75   Ht 5\' 8"  (1.727 m)   Wt 166 lb (75.3 kg)   SpO2 95%   BMI 25.24 kg/m     Wt Readings from Last 3 Encounters:  03/12/20 166 lb (75.3 kg)  09/04/19 161 lb 12.8 oz (73.4 kg)  07/03/19 170 lb (77.1 kg)     GEN:  Well nourished, well developed in no acute distress HEENT: Normal NECK: No JVD; No carotid bruits LYMPHATICS: No lymphadenopathy CARDIAC: RRR, no murmurs, no rubs, no gallops RESPIRATORY:  Clear to auscultation without rales, wheezing or rhonchi  ABDOMEN: Soft, non-tender, non-distended MUSCULOSKELETAL:  No edema; No deformity  SKIN: Warm and dry LOWER EXTREMITIES: no swelling NEUROLOGIC:  Alert and oriented x 3 PSYCHIATRIC:  Normal affect   ASSESSMENT:    1. Permanent atrial fibrillation ([place])   2. Ischemic cardiomyopathy   3. CAD- prior PCIs- cath- moderate CAD 09/22/16   4. Dyslipidemia    PLAN:    In order of problems listed above:  1. Permanent atrial fibrillation rate controlled anticoagulated which I will continue. 2. Ischemic cardiomyopathy ejection fraction 45 to 50% he is on long-acting beta-blocker which I will  continue. 3. Coronary disease stable denies having issue. 4. Dyslipidemia he is taking Lipitor 40 which I will continue, I do have his last fasting lipid profile 2020.  We will repeat the test today. 5. Recent admission to the hospital with Covid pneumonia, luckily he was only in the emergency room and seems to be recovering quite nicely after that.  I did review ER record for this visit   Medication Adjustments/Labs and Tests Ordered: Current medicines are reviewed at length with the patient today.  Concerns regarding medicines are outlined above.  No orders of the defined types were placed in this encounter.  Medication  changes: No orders of the defined types were placed in this encounter.   Signed, Park Liter, MD, [facility] 03/12/2020 2:52 PM    [facility]

## 2020-03-12 NOTE — Patient Instructions (Signed)

## 2020-05-21 DIAGNOSIS — K5909 Other constipation: Secondary | ICD-10-CM | POA: Diagnosis not present

## 2020-05-21 DIAGNOSIS — K625 Hemorrhage of anus and rectum: Secondary | ICD-10-CM | POA: Diagnosis not present

## 2020-05-29 ENCOUNTER — Other Ambulatory Visit: Payer: Self-pay | Admitting: Cardiology

## 2020-07-08 DIAGNOSIS — E785 Hyperlipidemia, unspecified: Secondary | ICD-10-CM | POA: Diagnosis not present

## 2020-07-08 DIAGNOSIS — Z1331 Encounter for screening for depression: Secondary | ICD-10-CM | POA: Diagnosis not present

## 2020-07-08 DIAGNOSIS — Z9181 History of falling: Secondary | ICD-10-CM | POA: Diagnosis not present

## 2020-07-08 DIAGNOSIS — Z139 Encounter for screening, unspecified: Secondary | ICD-10-CM | POA: Diagnosis not present

## 2020-07-08 DIAGNOSIS — Z0000 Encounter for general adult medical examination without abnormal findings: Secondary | ICD-10-CM | POA: Diagnosis not present

## 2020-07-31 DIAGNOSIS — G301 Alzheimer's disease with late onset: Secondary | ICD-10-CM | POA: Diagnosis not present

## 2020-07-31 DIAGNOSIS — G4733 Obstructive sleep apnea (adult) (pediatric): Secondary | ICD-10-CM | POA: Diagnosis not present

## 2020-07-31 DIAGNOSIS — F0280 Dementia in other diseases classified elsewhere without behavioral disturbance: Secondary | ICD-10-CM | POA: Diagnosis not present

## 2020-08-20 ENCOUNTER — Inpatient Hospital Stay (HOSPITAL_COMMUNITY): Payer: PPO

## 2020-08-20 ENCOUNTER — Emergency Department (HOSPITAL_COMMUNITY): Payer: PPO

## 2020-08-20 ENCOUNTER — Encounter (HOSPITAL_COMMUNITY): Payer: Self-pay

## 2020-08-20 ENCOUNTER — Observation Stay (HOSPITAL_COMMUNITY): Payer: PPO

## 2020-08-20 ENCOUNTER — Observation Stay (HOSPITAL_COMMUNITY)
Admission: EM | Admit: 2020-08-20 | Discharge: 2020-08-21 | Disposition: A | Discharge status: Home / Self Care | Payer: PPO | Attending: Internal Medicine | Admitting: Internal Medicine

## 2020-08-20 ENCOUNTER — Other Ambulatory Visit: Payer: Self-pay

## 2020-08-20 DIAGNOSIS — I10 Essential (primary) hypertension: Secondary | ICD-10-CM | POA: Diagnosis not present

## 2020-08-20 DIAGNOSIS — F0390 Unspecified dementia without behavioral disturbance: Secondary | ICD-10-CM | POA: Diagnosis not present

## 2020-08-20 DIAGNOSIS — Y900 Blood alcohol level of less than 20 mg/100 ml: Secondary | ICD-10-CM | POA: Insufficient documentation

## 2020-08-20 DIAGNOSIS — I63411 Cerebral infarction due to embolism of right middle cerebral artery: Secondary | ICD-10-CM

## 2020-08-20 DIAGNOSIS — R4781 Slurred speech: Secondary | ICD-10-CM | POA: Diagnosis not present

## 2020-08-20 DIAGNOSIS — R41841 Cognitive communication deficit: Secondary | ICD-10-CM | POA: Diagnosis not present

## 2020-08-20 DIAGNOSIS — Z7902 Long term (current) use of antithrombotics/antiplatelets: Secondary | ICD-10-CM | POA: Insufficient documentation

## 2020-08-20 DIAGNOSIS — I482 Chronic atrial fibrillation, unspecified: Secondary | ICD-10-CM | POA: Diagnosis not present

## 2020-08-20 DIAGNOSIS — Z20822 Contact with and (suspected) exposure to covid-19: Secondary | ICD-10-CM | POA: Insufficient documentation

## 2020-08-20 DIAGNOSIS — I6310 Cerebral infarction due to embolism of unspecified precerebral artery: Secondary | ICD-10-CM

## 2020-08-20 DIAGNOSIS — I6522 Occlusion and stenosis of left carotid artery: Secondary | ICD-10-CM | POA: Diagnosis not present

## 2020-08-20 DIAGNOSIS — I4821 Permanent atrial fibrillation: Secondary | ICD-10-CM | POA: Diagnosis not present

## 2020-08-20 DIAGNOSIS — R2681 Unsteadiness on feet: Secondary | ICD-10-CM | POA: Diagnosis not present

## 2020-08-20 DIAGNOSIS — I6503 Occlusion and stenosis of bilateral vertebral arteries: Secondary | ICD-10-CM | POA: Diagnosis not present

## 2020-08-20 DIAGNOSIS — I6622 Occlusion and stenosis of left posterior cerebral artery: Secondary | ICD-10-CM | POA: Diagnosis not present

## 2020-08-20 DIAGNOSIS — R404 Transient alteration of awareness: Secondary | ICD-10-CM | POA: Diagnosis not present

## 2020-08-20 DIAGNOSIS — Z79899 Other long term (current) drug therapy: Secondary | ICD-10-CM | POA: Insufficient documentation

## 2020-08-20 DIAGNOSIS — R0902 Hypoxemia: Secondary | ICD-10-CM | POA: Diagnosis not present

## 2020-08-20 DIAGNOSIS — R2990 Unspecified symptoms and signs involving the nervous system: Secondary | ICD-10-CM

## 2020-08-20 DIAGNOSIS — G459 Transient cerebral ischemic attack, unspecified: Secondary | ICD-10-CM | POA: Diagnosis present

## 2020-08-20 DIAGNOSIS — I255 Ischemic cardiomyopathy: Secondary | ICD-10-CM

## 2020-08-20 DIAGNOSIS — Z9861 Coronary angioplasty status: Secondary | ICD-10-CM | POA: Insufficient documentation

## 2020-08-20 DIAGNOSIS — J189 Pneumonia, unspecified organism: Secondary | ICD-10-CM | POA: Insufficient documentation

## 2020-08-20 DIAGNOSIS — R29810 Facial weakness: Secondary | ICD-10-CM | POA: Diagnosis not present

## 2020-08-20 DIAGNOSIS — I2510 Atherosclerotic heart disease of native coronary artery without angina pectoris: Secondary | ICD-10-CM | POA: Diagnosis not present

## 2020-08-20 DIAGNOSIS — I6523 Occlusion and stenosis of bilateral carotid arteries: Secondary | ICD-10-CM | POA: Diagnosis not present

## 2020-08-20 DIAGNOSIS — R531 Weakness: Secondary | ICD-10-CM | POA: Diagnosis present

## 2020-08-20 DIAGNOSIS — I639 Cerebral infarction, unspecified: Secondary | ICD-10-CM | POA: Diagnosis not present

## 2020-08-20 LAB — CBC
HCT: 49.2 % (ref 39.0–52.0)
Hemoglobin: 16.3 g/dL (ref 13.0–17.0)
MCH: 32.8 pg (ref 26.0–34.0)
MCHC: 33.1 g/dL (ref 30.0–36.0)
MCV: 99 fL (ref 80.0–100.0)
Platelets: UNDETERMINED 10*3/uL (ref 150–400)
RBC: 4.97 MIL/uL (ref 4.22–5.81)
RDW: 13.4 % (ref 11.5–15.5)
WBC: 8.8 10*3/uL (ref 4.0–10.5)
nRBC: 0 % (ref 0.0–0.2)

## 2020-08-20 LAB — COMPREHENSIVE METABOLIC PANEL
ALT: 13 U/L (ref 0–44)
AST: 28 U/L (ref 15–41)
Albumin: 3.7 g/dL (ref 3.5–5.0)
Alkaline Phosphatase: 85 U/L (ref 38–126)
Anion gap: 8 (ref 5–15)
BUN: 19 mg/dL (ref 8–23)
CO2: 22 mmol/L (ref 22–32)
Calcium: 8.5 mg/dL — ABNORMAL LOW (ref 8.9–10.3)
Chloride: 108 mmol/L (ref 98–111)
Creatinine, Ser: 1.4 mg/dL — ABNORMAL HIGH (ref 0.61–1.24)
GFR, Estimated: 50 mL/min — ABNORMAL LOW (ref >60)
Glucose, Bld: 96 mg/dL (ref 70–99)
Potassium: 4.4 mmol/L (ref 3.5–5.1)
Sodium: 138 mmol/L (ref 135–145)
Total Bilirubin: 0.8 mg/dL (ref 0.3–1.2)
Total Protein: 6.4 g/dL — ABNORMAL LOW (ref 6.5–8.1)

## 2020-08-20 LAB — URINALYSIS, ROUTINE W REFLEX MICROSCOPIC
Bilirubin Urine: NEGATIVE
Glucose, UA: NEGATIVE mg/dL
Hgb urine dipstick: NEGATIVE
Ketones, ur: NEGATIVE mg/dL
Leukocytes,Ua: NEGATIVE
Nitrite: NEGATIVE
Protein, ur: NEGATIVE mg/dL
Specific Gravity, Urine: 1.031 — ABNORMAL HIGH (ref 1.005–1.030)
pH: 7 (ref 5.0–8.0)

## 2020-08-20 LAB — DIFFERENTIAL
Abs Immature Granulocytes: 0.03 10*3/uL (ref 0.00–0.07)
Basophils Absolute: 0 10*3/uL (ref 0.0–0.1)
Basophils Relative: 1 %
Eosinophils Absolute: 0.4 10*3/uL (ref 0.0–0.5)
Eosinophils Relative: 5 %
Immature Granulocytes: 0 %
Lymphocytes Relative: 36 %
Lymphs Abs: 3.2 10*3/uL (ref 0.7–4.0)
Monocytes Absolute: 1 10*3/uL (ref 0.1–1.0)
Monocytes Relative: 11 %
Neutro Abs: 4.1 10*3/uL (ref 1.7–7.7)
Neutrophils Relative %: 47 %
Smear Review: UNDETERMINED

## 2020-08-20 LAB — RESP PANEL BY RT-PCR (FLU A&B, COVID) ARPGX2
Influenza A by PCR: NEGATIVE
Influenza B by PCR: NEGATIVE
SARS Coronavirus 2 by RT PCR: NEGATIVE

## 2020-08-20 LAB — APTT: aPTT: 30 seconds (ref 24–36)

## 2020-08-20 LAB — I-STAT CHEM 8, ED
BUN: 23 mg/dL (ref 8–23)
Calcium, Ion: 1.03 mmol/L — ABNORMAL LOW (ref 1.15–1.40)
Chloride: 108 mmol/L (ref 98–111)
Creatinine, Ser: 1.2 mg/dL (ref 0.61–1.24)
Glucose, Bld: 97 mg/dL (ref 70–99)
HCT: 48 % (ref 39.0–52.0)
Hemoglobin: 16.3 g/dL (ref 13.0–17.0)
Potassium: 4.2 mmol/L (ref 3.5–5.1)
Sodium: 141 mmol/L (ref 135–145)
TCO2: 23 mmol/L (ref 22–32)

## 2020-08-20 LAB — RAPID URINE DRUG SCREEN, HOSP PERFORMED
Amphetamines: NOT DETECTED
Barbiturates: NOT DETECTED
Benzodiazepines: NOT DETECTED
Cocaine: NOT DETECTED
Opiates: NOT DETECTED
Tetrahydrocannabinol: NOT DETECTED

## 2020-08-20 LAB — PROTIME-INR
INR: 1.1 (ref 0.8–1.2)
Prothrombin Time: 14.6 seconds (ref 11.4–15.2)

## 2020-08-20 LAB — ETHANOL: Alcohol, Ethyl (B): <10 mg/dL (ref <10)

## 2020-08-20 MED ORDER — ATORVASTATIN CALCIUM 80 MG PO TABS
80.0000 mg | ORAL_TABLET | Freq: Every day | ORAL | Status: DC
  Administered 2020-08-20 – 2020-08-21 (×2): 80 mg via ORAL
  Filled 2020-08-20: qty 1
  Filled 2020-08-20: qty 2

## 2020-08-20 MED ORDER — B COMPLEX-C PO TABS
1.0000 | ORAL_TABLET | Freq: Every day | ORAL | Status: DC
  Administered 2020-08-20 – 2020-08-21 (×2): 1 via ORAL
  Filled 2020-08-20 (×2): qty 1

## 2020-08-20 MED ORDER — ATORVASTATIN CALCIUM 40 MG PO TABS: 40.0000 mg | ORAL_TABLET | Freq: Every day | ORAL | Status: DC

## 2020-08-20 MED ORDER — LORAZEPAM 1 MG PO TABS
0.5000 mg | ORAL_TABLET | ORAL | Status: AC | PRN
  Administered 2020-08-20: 0.5 mg via ORAL
  Filled 2020-08-20: qty 1

## 2020-08-20 MED ORDER — SODIUM CHLORIDE 0.9 % IV SOLN: INTRAVENOUS | Status: DC

## 2020-08-20 MED ORDER — STROKE: EARLY STAGES OF RECOVERY BOOK: Freq: Once | Status: AC

## 2020-08-20 MED ORDER — HYDRALAZINE HCL 25 MG PO TABS: 25.0000 mg | ORAL_TABLET | Freq: Four times a day (QID) | ORAL | Status: DC | PRN

## 2020-08-20 MED ORDER — CLOPIDOGREL BISULFATE 75 MG PO TABS
75.0000 mg | ORAL_TABLET | Freq: Every day | ORAL | Status: DC
  Administered 2020-08-20 – 2020-08-21 (×2): 75 mg via ORAL
  Filled 2020-08-20 (×2): qty 1

## 2020-08-20 MED ORDER — ZOLPIDEM TARTRATE 5 MG PO TABS
5.0000 mg | ORAL_TABLET | Freq: Every day | ORAL | Status: DC
  Administered 2020-08-20: 5 mg via ORAL
  Filled 2020-08-20: qty 1

## 2020-08-20 MED ORDER — MEMANTINE HCL 10 MG PO TABS
10.0000 mg | ORAL_TABLET | Freq: Two times a day (BID) | ORAL | Status: DC
  Administered 2020-08-20 – 2020-08-21 (×2): 10 mg via ORAL
  Filled 2020-08-20 (×2): qty 1

## 2020-08-20 MED ORDER — ACETAMINOPHEN 160 MG/5ML PO SOLN: 650.0000 mg | ORAL | Status: DC | PRN

## 2020-08-20 MED ORDER — ACETAMINOPHEN 650 MG RE SUPP: 650.0000 mg | RECTAL | Status: DC | PRN

## 2020-08-20 MED ORDER — VITAMIN B-COMPLEX PO TABS: ORAL_TABLET | Freq: Every day | ORAL | Status: DC

## 2020-08-20 MED ORDER — DONEPEZIL HCL 10 MG PO TABS
5.0000 mg | ORAL_TABLET | Freq: Every day | ORAL | Status: DC
  Administered 2020-08-20: 5 mg via ORAL
  Filled 2020-08-20: qty 1

## 2020-08-20 MED ORDER — SODIUM CHLORIDE 0.9 % IV BOLUS
1000.0000 mL | Freq: Once | INTRAVENOUS | Status: AC
  Administered 2020-08-20: 1000 mL via INTRAVENOUS

## 2020-08-20 MED ORDER — APIXABAN 5 MG PO TABS
5.0000 mg | ORAL_TABLET | Freq: Two times a day (BID) | ORAL | Status: DC
  Administered 2020-08-20 – 2020-08-21 (×2): 5 mg via ORAL
  Filled 2020-08-20 (×2): qty 1

## 2020-08-20 MED ORDER — ACETAMINOPHEN 325 MG PO TABS: 650.0000 mg | ORAL_TABLET | ORAL | Status: DC | PRN

## 2020-08-20 MED ORDER — SENNOSIDES-DOCUSATE SODIUM 8.6-50 MG PO TABS: 1.0000 | ORAL_TABLET | Freq: Every evening | ORAL | Status: DC | PRN

## 2020-08-20 MED ORDER — METOPROLOL SUCCINATE ER 25 MG PO TB24
25.0000 mg | ORAL_TABLET | Freq: Every day | ORAL | Status: DC
  Administered 2020-08-20 – 2020-08-21 (×2): 25 mg via ORAL
  Filled 2020-08-20 (×2): qty 1

## 2020-08-20 MED ORDER — IOHEXOL 350 MG/ML SOLN
60.0000 mL | Freq: Once | INTRAVENOUS | Status: AC | PRN
  Administered 2020-08-20: 60 mL via INTRAVENOUS

## 2020-08-20 MED ORDER — ESCITALOPRAM OXALATE 10 MG PO TABS: 10.0000 mg | ORAL_TABLET | Freq: Every day | ORAL | Status: DC

## 2020-08-20 MED ORDER — ALPRAZOLAM 0.25 MG PO TABS
0.2500 mg | ORAL_TABLET | Freq: Two times a day (BID) | ORAL | Status: DC | PRN
  Administered 2020-08-20: 0.25 mg via ORAL
  Filled 2020-08-20: qty 1

## 2020-08-20 MED ORDER — ALBUTEROL SULFATE (2.5 MG/3ML) 0.083% IN NEBU: 2.5000 mg | INHALATION_SOLUTION | Freq: Four times a day (QID) | RESPIRATORY_TRACT | Status: DC | PRN

## 2020-08-20 MED ORDER — PANTOPRAZOLE SODIUM 40 MG PO TBEC
40.0000 mg | DELAYED_RELEASE_TABLET | Freq: Every day | ORAL | Status: DC
  Administered 2020-08-20 – 2020-08-21 (×2): 40 mg via ORAL
  Filled 2020-08-20 (×2): qty 1

## 2020-08-20 NOTE — ED Notes (Signed)
Lonnie Boyd daughter in law [phone] would like an update

## 2020-08-20 NOTE — H&P (Signed)
History and Physical    Lonnie Boyd [id number] DOB: 02/08/1940 DOA: 08/20/2020  PCP: [name], MD (Confirm with patient/family/NH records and if not entered, this has to be entered at [facility] point of entry) Patient coming from: Home  I have personally briefly reviewed patient's old medical records in [place]  Chief Complaint: Left sided weakness, slurred speech and confusion  HPI: Lonnie Boyd is a 80 y.o. 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 fib on Eliquis, HTN, new onset of dementia 2021, HLD, presented with new onset of left-sided weakness, slurred speech and confusion.  Patient does not recall what happened this morning, most history provided by patient's son and daughter-in-law over the phone.  Son [name], who lives with the patient reported patient developed left-sided weakness and clenching of the left fist "unable to release" and left-sided facial droop as well as trouble talking this morning.  Symptoms lasted about 2 hours and resolved in the ED.  Patient denied any numbness weakness or abdominal limbs, no blurry vision or hearing changes.  At baseline, patient has stage I dementia, able to dress himself and bathe himself.  No history of stroke or TIA.  ED Course: Considered to be not a candidate for tPA given patient on Eliquis.  CT head negative for acute finding, CTA negative for major stenosis.  Review of Systems: As per HPI otherwise 14 point review of systems negative.    Past Medical History:  Diagnosis Date   Acute blood loss anemia    Anemia-HEME positive    CAD (coronary artery disease)    CAD- prior PCIs- cath- moderate CAD 09/22/16    Cardiac arrest with ventricular fibrillation ([place])    Cardiomyopathy ([place])    Dementia ([place]) 09/04/2019   Dizziness 06/23/2017   Dyslipidemia 05/15/2015   Falls 05/24/2017   Gastrointestinal hemorrhage    Hyperlipemia    Ischemic  cardiomyopathy    Multiple closed fractures of ribs of both sides    NSTEMI (non-ST elevated myocardial infarction) ([place]) 09/20/2016   OSA (obstructive sleep apnea) 05/04/2019   PAF (paroxysmal atrial fibrillation) ([place]) 09/25/2016   Permanent atrial fibrillation ([place]) 03/01/2019   Torsades de pointes (HCC)    Ventral hernia     Past Surgical History:  Procedure Laterality Date   KIDNEY SURGERY     LEFT HEART CATH AND CORONARY ANGIOGRAPHY N/A 09/20/2016   Procedure: LEFT HEART CATH AND CORONARY ANGIOGRAPHY;  Surgeon: Belva Crome, MD;  Location: [place] [facility];  Service: Cardiovascular;  Laterality: N/A;     reports that he has never smoked. He has never used smokeless tobacco. He reports that he does not drink alcohol and does not use drugs.  No Known Allergies  Family History  Problem Relation Age of Onset   Cancer Father    Hypertension Father    Heart failure Father    Leukemia Sister      Prior to Admission medications   Medication Sig Start Date End Date Taking? Authorizing Provider  B Complex Vitamins (VITAMIN B-COMPLEX PO) Take 1 tablet by mouth daily.   Yes [provider]  Cholecalciferol (VITAMIN D3 PO) Take 1 tablet by mouth daily.   Yes [provider]  ELIQUIS 5 MG TABS tablet TAKE ONE TABLET BY MOUTH TWICE DAILY 10/04/19  Yes Park Liter, MD  losartan (COZAAR) 50 MG tablet TAKE ONE TABLET BY MOUTH DAILY 05/29/20  Yes Agustin Cree,  Marily Lente, MD  memantine (NAMENDA) 10 MG tablet Take 10 mg by mouth 2 (two) times daily. 09/24/19  Yes [provider]  albuterol (PROVENTIL) (2.5 MG/3ML) 0.083% nebulizer solution USE 1 VIAL VIA NEBULIZER 4 TIMES DAILY as needed for shortness of breath 08/18/16   [provider]  ALPRAZolam (XANAX) 0.25 MG tablet Take 0.25 mg by mouth 2 (two) times daily as needed. for anxiety 07/19/17   [provider]  atorvastatin (LIPITOR) 40 MG tablet TAKE TWO TABLETS BY MOUTH EVERY DAY AT 6 PM 02/27/18    Park Liter, MD  clopidogrel (PLAVIX) 75 MG tablet Take 75 mg by mouth daily.    [provider]  donepezil (ARICEPT) 5 MG tablet Take 5 mg by mouth at bedtime. 08/24/19   [provider]  escitalopram (LEXAPRO) 10 MG tablet Take 10 mg by mouth daily. 08/31/19   [provider]  Isosorbide Mononitrate (IMDUR PO) Take by mouth.    [provider]  metoprolol succinate (TOPROL-XL) 25 MG 24 hr tablet Take 1 tablet (25 mg total) by mouth daily. Take with or immediately following a meal. 08/29/18   Park Liter, MD  nitroGLYCERIN (NITROSTAT) 0.4 MG SL tablet Place 1 tablet (0.4 mg total) under the tongue every 5 (five) minutes x 3 doses as needed for chest pain. Patient not taking: No sig reported 09/25/16   Erlene Quan, PA-C  Pantoprazole Sodium (PROTONIX PO) Take by mouth.    [provider]  PRESCRIPTION MEDICATION Atrovent    [provider]  Zolpidem Tartrate (AMBIEN PO) Take by mouth.    [provider]    Physical Exam: Vitals:   08/20/20 1300 08/20/20 1315 08/20/20 1330 08/20/20 1345  BP: (!) 153/85 (!) 162/150 (!) 133/91 (!) 158/131  Pulse: 69 (!) 55 69 72  Resp: '17 12 15 17  '$ Temp:      TempSrc:      SpO2: 98% 95% 96% 99%  Weight:      Height:        Constitutional: NAD, calm, comfortable Vitals:   08/20/20 1300 08/20/20 1315 08/20/20 1330 08/20/20 1345  BP: (!) 153/85 (!) 162/150 (!) 133/91 (!) 158/131  Pulse: 69 (!) 55 69 72  Resp: '17 12 15 17  '$ Temp:      TempSrc:      SpO2: 98% 95% 96% 99%  Weight:      Height:       Eyes: PERRL, lids and conjunctivae normal ENMT: Mucous membranes are moist. Posterior pharynx clear of any exudate or lesions.Normal dentition.  Neck: normal, supple, no masses, no thyromegaly Respiratory: clear to auscultation bilaterally, no wheezing, no crackles. Normal respiratory effort. No accessory muscle use.  Cardiovascular: Regular rate and rhythm, no murmurs / rubs /  gallops. No extremity edema. 2+ pedal pulses. No carotid bruits.  Abdomen: no tenderness, no masses palpated. No hepatosplenomegaly. Bowel sounds positive.  Musculoskeletal: no clubbing / cyanosis. No joint deformity upper and lower extremities. Good ROM, no contractures. Normal muscle tone.  Skin: no rashes, lesions, ulcers. No induration Neurologic: CN 2-12 grossly intact. Sensation intact, DTR normal. Strength 5/5 in all 4.  Psychiatric: Normal judgment and insight. Alert and oriented x 3. Normal mood.     Labs on Admission: I have personally reviewed following labs and imaging studies  CBC: Recent Labs  Lab 08/20/20 0858 08/20/20 0901  WBC 8.8  --   NEUTROABS 4.1  --   HGB 16.3 16.3  HCT  49.2 48.0  MCV 99.0  --   PLT PLATELET CLUMPS NOTED ON SMEAR, UNABLE TO ESTIMATE  --    Basic Metabolic Panel: Recent Labs  Lab 08/20/20 0858 08/20/20 0901  NA 138 141  K 4.4 4.2  CL 108 108  CO2 22  --   GLUCOSE 96 97  BUN 19 23  CREATININE 1.40* 1.20  CALCIUM 8.5*  --    GFR: Estimated Creatinine Clearance: 45.9 mL/min (by C-G formula based on SCr of 1.2 mg/dL). Liver Function Tests: Recent Labs  Lab 08/20/20 0858  AST 28  ALT 13  ALKPHOS 85  BILITOT 0.8  PROT 6.4*  ALBUMIN 3.7   No results for input(s): LIPASE, AMYLASE in the last 168 hours. No results for input(s): AMMONIA in the last 168 hours. Coagulation Profile: Recent Labs  Lab 08/20/20 1038  INR 1.1   Cardiac Enzymes: No results for input(s): CKTOTAL, CKMB, CKMBINDEX, TROPONINI in the last 168 hours. BNP (last 3 results) No results for input(s): PROBNP in the last 8760 hours. HbA1C: No results for input(s): HGBA1C in the last 72 hours. CBG: No results for input(s): GLUCAP in the last 168 hours. Lipid Profile: No results for input(s): CHOL, HDL, LDLCALC, TRIG, CHOLHDL, LDLDIRECT in the last 72 hours. Thyroid Function Tests: No results for input(s): TSH, T4TOTAL, FREET4, T3FREE, THYROIDAB in the last 72  hours. Anemia Panel: No results for input(s): VITAMINB12, FOLATE, FERRITIN, TIBC, IRON, RETICCTPCT in the last 72 hours. Urine analysis:    Component Value Date/Time   COLORURINE YELLOW 08/20/2020 1245   APPEARANCEUR CLEAR 08/20/2020 1245   LABSPEC 1.031 (H) 08/20/2020 1245   PHURINE 7.0 08/20/2020 1245   GLUCOSEU NEGATIVE 08/20/2020 1245   HGBUR NEGATIVE 08/20/2020 1245   BILIRUBINUR NEGATIVE 08/20/2020 1245   KETONESUR NEGATIVE 08/20/2020 1245   PROTEINUR NEGATIVE 08/20/2020 1245   NITRITE NEGATIVE 08/20/2020 1245   LEUKOCYTESUR NEGATIVE 08/20/2020 1245    Radiological Exams on Admission: CT HEAD CODE STROKE WO CONTRAST  Result Date: 08/20/2020 CLINICAL DATA:  Code stroke.  80 year old male. EXAM: CT HEAD WITHOUT CONTRAST TECHNIQUE: Contiguous axial images were obtained from the base of the skull through the vertex without intravenous contrast. COMPARISON:  Head CT 05/18/2017. FINDINGS: Study is mildly degraded by motion artifact despite repeated imaging attempts. Brain: Cerebral volume is stable since 2019. No midline shift, ventriculomegaly, mass effect, evidence of mass lesion, intracranial hemorrhage or evidence of cortically based acute infarction. Gray-white matter differentiation remains within normal limits for age. Minimal white matter hypodensity for age. No cortical encephalomalacia identified. Vascular: Calcified atherosclerosis at the skull base. No suspicious intracranial vascular hyperdensity. Skull: No acute osseous abnormality identified. Sinuses/Orbits: Visualized paranasal sinuses and mastoids are stable and well aerated. Other: Visualized orbits and scalp soft tissues are within normal limits. ASPECTS V [facility] Stroke Program Early CT Score) Total score (0-10 with 10 being normal): 10 IMPRESSION: 1. Mildly degraded by motion with no acute cortically based infarct or acute intracranial hemorrhage identified. ASPECTS 10. 2. These results were communicated to Dr. Cheral Marker at  9:15 am on 08/20/2020 by text page via the [facility] messaging system. Electronically Signed   By: Genevie Ann M.D.   On: 08/20/2020 09:16   CT ANGIO HEAD CODE STROKE  Result Date: 08/20/2020 CLINICAL DATA:  Code stroke.  80 year old male. EXAM: CT ANGIOGRAPHY HEAD AND NECK TECHNIQUE: Multidetector CT imaging of the head and neck was performed using the standard protocol during bolus administration of intravenous contrast. Multiplanar CT image reconstructions and MIPs  were obtained to evaluate the vascular anatomy. Carotid stenosis measurements (when applicable) are obtained utilizing NASCET criteria, using the distal internal carotid diameter as the denominator. CONTRAST:  67m OMNIPAQUE IOHEXOL 350 MG/ML SOLN COMPARISON:  Plain head CT 0904 hours today. FINDINGS: CTA NECK Skeleton: Advanced lower cervical spine disc and endplate degeneration. Mild chronic appearing T3 superior endplate compression. No acute osseous abnormality identified. Upper chest: Mild generalized peribronchial thickening in the upper lobes. No superior mediastinal lymphadenopathy. Other neck: Negative. Aortic arch: 3 vessel arch configuration with minimal arch atherosclerosis. Right carotid system: Tortuous brachiocephalic artery without plaque or stenosis. Negative right CCA origin. Mild plaque at the right carotid bifurcation without stenosis. Mildly tortuous cervical right ICA to the skull base. Left carotid system: Mildly tortuous but otherwise negative proximal left CCA. Mild calcified plaque at the left ICA origin without stenosis. Mildly tortuous cervical left ICA. Vertebral arteries: Mild proximal right subclavian artery plaque without stenosis. Normal right vertebral artery origin. Patent right vertebral artery to the skull base without plaque or stenosis. Mild soft and calcified plaque of the proximal left subclavian artery without stenosis. Soft plaque and mild stenosis at the left vertebral artery origin on series 8, image 176.  Tortuous left V1 segment. Fairly codominant left vertebral artery is otherwise patent and normal to the skull base. CTA HEAD Posterior circulation: Patent codominant distal vertebral arteries to the vertebrobasilar junction without stenosis. Normal left PICA origin. Right PICA appears diminutive or absent. Patent basilar artery without stenosis. Patent SCA origins. Fetal type right PCA origin. Small left posterior communicating artery. Mild left P1 segment irregularity and stenosis. Otherwise bilateral PCA branches are within normal limits. Anterior circulation: Both ICA siphons are patent. Bilateral siphon calcified plaque. Mild to moderate supraclinoid ICA stenosis on the left (series 8, image 100). Normal left posterior communicating and ophthalmic artery origins. On the right moderate supraclinoid stenosis (series 9, image 95) due to calcified plaque situated between normal right ophthalmic and posterior communicating artery origins. Patent carotid termini, MCA and ACA origins. Normal anterior communicating artery. Bilateral ACA branches are within normal limits. Left MCA M1 segment is mildly tortuous. Left MCA bifurcation is patent without stenosis. Left MCA branches are within normal limits. Right MCA M1 segment is mildly tortuous and patent without stenosis. Patent right MCA bi or trifurcation without stenosis. Dominant posterior right MCA M2 branch (series 12, image 14). No right MCA branch stenosis or occlusion identified. Venous sinuses: Early contrast timing, grossly patent. Anatomic variants: Fetal type right PCA origin. Preliminary report of this exam discussed by telephone with Dr. EKerney Elbeon 08/20/2020 at 0923 hours. Review of the MIP images confirms the above findings IMPRESSION: 1. Negative for large vessel occlusion. 2. Mild atherosclerosis in the neck and no significant extracranial stenosis. 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 These results were  communicated to Dr. LCheral Markerat 9:27 am on 08/20/2020 by text page via the [facility] system. Electronically Signed   By: HGenevie AnnM.D.   On: 08/20/2020 09:29   CT ANGIO NECK CODE STROKE  Result Date: 08/20/2020 CLINICAL DATA:  Code stroke.  80year old male. EXAM: CT ANGIOGRAPHY HEAD AND NECK TECHNIQUE: Multidetector CT imaging of the head and neck was performed using the standard protocol during bolus administration of intravenous contrast. Multiplanar CT image reconstructions and MIPs were obtained to evaluate the vascular anatomy. Carotid stenosis measurements (when applicable) are obtained utilizing NASCET criteria, using the distal internal carotid diameter as the denominator. CONTRAST:  61m  OMNIPAQUE IOHEXOL 350 MG/ML SOLN COMPARISON:  Plain head CT 0904 hours today. FINDINGS: CTA NECK Skeleton: Advanced lower cervical spine disc and endplate degeneration. Mild chronic appearing T3 superior endplate compression. No acute osseous abnormality identified. Upper chest: Mild generalized peribronchial thickening in the upper lobes. No superior mediastinal lymphadenopathy. Other neck: Negative. Aortic arch: 3 vessel arch configuration with minimal arch atherosclerosis. Right carotid system: Tortuous brachiocephalic artery without plaque or stenosis. Negative right CCA origin. Mild plaque at the right carotid bifurcation without stenosis. Mildly tortuous cervical right ICA to the skull base. Left carotid system: Mildly tortuous but otherwise negative proximal left CCA. Mild calcified plaque at the left ICA origin without stenosis. Mildly tortuous cervical left ICA. Vertebral arteries: Mild proximal right subclavian artery plaque without stenosis. Normal right vertebral artery origin. Patent right vertebral artery to the skull base without plaque or stenosis. Mild soft and calcified plaque of the proximal left subclavian artery without stenosis. Soft plaque and mild stenosis at the left vertebral artery origin  on series 8, image 176. Tortuous left V1 segment. Fairly codominant left vertebral artery is otherwise patent and normal to the skull base. CTA HEAD Posterior circulation: Patent codominant distal vertebral arteries to the vertebrobasilar junction without stenosis. Normal left PICA origin. Right PICA appears diminutive or absent. Patent basilar artery without stenosis. Patent SCA origins. Fetal type right PCA origin. Small left posterior communicating artery. Mild left P1 segment irregularity and stenosis. Otherwise bilateral PCA branches are within normal limits. Anterior circulation: Both ICA siphons are patent. Bilateral siphon calcified plaque. Mild to moderate supraclinoid ICA stenosis on the left (series 8, image 100). Normal left posterior communicating and ophthalmic artery origins. On the right moderate supraclinoid stenosis (series 9, image 95) due to calcified plaque situated between normal right ophthalmic and posterior communicating artery origins. Patent carotid termini, MCA and ACA origins. Normal anterior communicating artery. Bilateral ACA branches are within normal limits. Left MCA M1 segment is mildly tortuous. Left MCA bifurcation is patent without stenosis. Left MCA branches are within normal limits. Right MCA M1 segment is mildly tortuous and patent without stenosis. Patent right MCA bi or trifurcation without stenosis. Dominant posterior right MCA M2 branch (series 12, image 14). No right MCA branch stenosis or occlusion identified. Venous sinuses: Early contrast timing, grossly patent. Anatomic variants: Fetal type right PCA origin. Preliminary report of this exam discussed by telephone with Dr. Kerney Elbe on 08/20/2020 at 0923 hours. Review of the MIP images confirms the above findings IMPRESSION: 1. Negative for large vessel occlusion. 2. Mild atherosclerosis in the neck and no significant extracranial stenosis. 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  These results were communicated to Dr. Cheral Marker at 9:27 am on 08/20/2020 by text page via the [facility] messaging system. Electronically Signed   By: Genevie Ann M.D.   On: 08/20/2020 09:29    EKG: Independently reviewed.  Chronic A. fib, no acute ST changes.  Assessment/Plan Active Problems:   Stroke (cerebrum) (HCC)   TIA (transient ischemic attack)  (please populate well all problems here in Problem List. (For example, if patient is on BP meds at home and you resume or decide to hold them, it is a problem that needs to be her. Same for CAD, COPD, HLD and so on)  TIA -With new onset left-sided weakness, facial droop and confusion, symptoms largely resolved. -MRI. If MRI is negative, should consider other etiologies such as amyloidosis, as patient also has dementia. -PT/OT -Echo -Allow permissive hypertension  for 48 hours.  Hold ARB and Imdur, as needed hydralazine for now. -Continue Plavix and statin.  Chronic A. Fib -Rate controlled, continue low-dose metoprolol -Continue Eliquis.  CAD -No acute issue, continue Plavix and statin.  Hx of ischemic cardiomyopathy -Euvolemic and BP fairly controlled.  Alzheimer's dementia -Continue memantine and Aricept  HTN -Permissive hypertension  DVT prophylaxis: Eliquis Code Status: Full code Family Communication: Son [name] and daughter-in-law over the phone Disposition Plan: Expect less than 2 midnight hospital stay Consults called: Neurology Admission status: Telemetry observation   Lequita Halt MD [facility] Pager [phone]  08/20/2020, 2:16 PM

## 2020-08-20 NOTE — ED Notes (Signed)
Urine sent to lab WITH a culture.

## 2020-08-20 NOTE — ED Notes (Signed)
PT provided with a meal tray and ate 100% of his food. PT up in chair at this time

## 2020-08-20 NOTE — Consult Note (Addendum)
Referring Physician: Dr. Almyra Free    Chief Complaint: Acute onset of left sided weakness  HPI: Lonnie Boyd is an 80 y.o. male with a PMHx of CAD, cardiac arrest with ventricular fibrillation, cardiomyopathy, dementia, dyslipidemia, GI bleed, NSTEMI, OSA, paroxysmal atrial fibrillation and ventral hernia presenting acutely from home via EMS for left sided weakness, slurred speech and confusion. LKN was 0750. Family witnessed the onset of the weakness at 62. On awakening, the patient was normal per family. He spoke with family about having breakfast, then sat down and onset of the weakness was then noted. EMS was called and arrived on scene at 80. The patient was noted by EMS to have left facial droop and left sided weakness. En route, he developed left sided visual loss. He has no prior history of stroke. Home medications include Eliquis and Plavix.   Patient was unable to recollect events of this morning. History was obtained from EMS and patient's son and daughter in law. The patient's son states that he witnessed his father start to have the left sided weakness, but also noted that he was clenching his left fist which he was apparently unable to release. He also had left facial droop and trouble talking.   BP on arrival: 180/96 BG: 109 CTH: no abnormality CTA: no LVO  LSN: [id number] tPA Given: No: Has prescription for Eliquis last filled in April. Unknown whether patient has been taking sporadically or not.  mRS: 1 NIHSS: 3  Past Medical History:  Diagnosis Date   Acute blood loss anemia    Anemia-HEME positive    CAD (coronary artery disease)    CAD- prior PCIs- cath- moderate CAD 09/22/16    Cardiac arrest with ventricular fibrillation ([place])    Cardiomyopathy ([place])    Dementia ([place]) 09/04/2019   Dizziness 06/23/2017   Dyslipidemia 05/15/2015   Falls 05/24/2017   Gastrointestinal hemorrhage    Hyperlipemia    Ischemic cardiomyopathy    Multiple closed fractures of ribs of both sides     NSTEMI (non-ST elevated myocardial infarction) ([place]) 09/20/2016   OSA (obstructive sleep apnea) 05/04/2019   PAF (paroxysmal atrial fibrillation) ([place]) 09/25/2016   Permanent atrial fibrillation ([place]) 03/01/2019   Torsades de pointes ([place])    Ventral hernia     Past Surgical History:  Procedure Laterality Date   KIDNEY SURGERY     LEFT HEART CATH AND CORONARY ANGIOGRAPHY N/A 09/20/2016   Procedure: LEFT HEART CATH AND CORONARY ANGIOGRAPHY;  Surgeon: Belva Crome, MD;  Location: [facility];  Service: Cardiovascular;  Laterality: N/A;    Family History  Problem Relation Age of Onset   Cancer Father    Hypertension Father    Heart failure Father    Leukemia Sister    Social History:  reports that he has never smoked. He has never used smokeless tobacco. He reports that he does not drink alcohol and does not use drugs.  Allergies: No Known Allergies  Medications:  No current facility-administered medications on file prior to encounter.   Current Outpatient Medications on File Prior to Encounter  Medication Sig Dispense Refill   B Complex Vitamins (VITAMIN B-COMPLEX PO) Take 1 tablet by mouth daily.     Cholecalciferol (VITAMIN D3 PO) Take 1 tablet by mouth daily.     ELIQUIS 5 MG TABS tablet TAKE ONE TABLET BY MOUTH TWICE DAILY 180 tablet 1   losartan (COZAAR) 50 MG tablet TAKE ONE TABLET BY MOUTH DAILY 90 tablet 2  memantine (NAMENDA) 10 MG tablet Take 10 mg by mouth 2 (two) times daily.     albuterol (PROVENTIL) (2.5 MG/3ML) 0.083% nebulizer solution USE 1 VIAL VIA NEBULIZER 4 TIMES DAILY as needed for shortness of breath  0   ALPRAZolam (XANAX) 0.25 MG tablet Take 0.25 mg by mouth 2 (two) times daily as needed. for anxiety  0   atorvastatin (LIPITOR) 40 MG tablet TAKE TWO TABLETS BY MOUTH EVERY DAY AT 6 PM 180 tablet 2   clopidogrel (PLAVIX) 75 MG tablet Take 75 mg by mouth daily.     donepezil (ARICEPT) 5 MG tablet Take 5 mg by mouth at bedtime.     escitalopram (LEXAPRO)  10 MG tablet Take 10 mg by mouth daily.     Isosorbide Mononitrate (IMDUR PO) Take by mouth.     metoprolol succinate (TOPROL-XL) 25 MG 24 hr tablet Take 1 tablet (25 mg total) by mouth daily. Take with or immediately following a meal. 90 tablet 1   nitroGLYCERIN (NITROSTAT) 0.4 MG SL tablet Place 1 tablet (0.4 mg total) under the tongue every 5 (five) minutes x 3 doses as needed for chest pain. (Patient not taking: No sig reported) 25 tablet 3   Pantoprazole Sodium (PROTONIX PO) Take by mouth.     PRESCRIPTION MEDICATION Atrovent     Zolpidem Tartrate (AMBIEN PO) Take by mouth.       ROS:  Denies any falls, HA, cough, SOB or vision changes. The patient is otherwise unable to provide a coherent ROS.   Physical Examination: There were no vitals taken for this visit.  HEENT: [place]/AT Lungs: Respirations unlabored Ext: Warm and well perfused  Neurologic Examination: Mental Status: Awake and alert. Confused. Decreased situational awareness. Is HOH but with instructions given loudly, the patient has difficulty following, requiring frequent repetition. Speech is mildly dysarthric. In the context of paucity of speech with short replies to questions, no dysfluency is noted. Naming intact. Oriented to city and state, but not to day, month or year. Poor insight.  Cranial Nerves: II:  Equivocal visual field testing initially with left field cut, then repeat exam by rapid response showed vision loss OS with intact vision in all fields OD. Follow up exam revealed normal visual fields in both eyes with intact acuity OU. PERRL.  III,IV, VI: EOMI without nystagmus.  V: Variable responses to tests of FT bilaterally.  VII: Subtle left facial droop resolved after CT.  VIII: Moderate to severe HOH IX,X: Unable to assess XI: Subtle lag on the left with shoulder shrug XII: Midline tongue extension  Motor: No pronator drift, but the left arm slightly lags the right when patient asked to elevate them together.  Left hand with decreased spontaneous movement and finger dexterity compared to right but with 5/5 strength. LUE more proximally also with 5/5 strength.  LLE 5/5 RUE and RLE 4+/5 against resistance, but LLE elevates less briskly than right. Rotating fingers test is positive on the left.   Sensory: decreased to light touch in left arm and leg  Deep Tendon Reflexes:  2+ bilateral brachioradialis 1+ bilateral patellae Right toe downgoing Left toe upgoing Cerebellar: Slow but without ataxia with FNF bilaterally Gait: Deferred  CT head: Mildly degraded by motion with no acute cortically based infarct or acute intracranial hemorrhage identified. ASPECTS 10.  CTA of head and neck: 1. Negative for large vessel occlusion. 2. Mild atherosclerosis in the neck and no significant extracranial stenosis. But moderate bilateral supraclinoid ICA stenosis due to calcified  plaque.  Assessment: 80 y.o. male with PMH significant for MI (2018), chronic atrial fibrillation on eliquis, OSA, HTN who presented to [facility] ED as a code stroke for left sided weakness, facial droop and slurred speech. On initial presentation patient had a gaze preference to right and had mild neglect of his left side. 1. Exam revealed left eye blindness, decreased sensation in left arm and leg.  2. CTH did not show a hemorrhage. The CTA did not show an LVO  thus patient not a candidate for IR. Patient symptoms began improving. Patient was not a candidate for TPA given that he is on eliquis.  3. Given risk factors and age will proceed with CVA work-up.  4. Stroke Risk Factors - atrial fibrillation and hypertension    Recommendations: --BP goal : modified Permissive HTN  if SBP > 180 please treat BP. If SBP< 120 please give IV fluids --MRI Brain --Echocardiogram --Continue eliquis and plavix as prescribed --HgbA1c, fasting lipid panel --PT consult, OT consult, Speech consult --Telemetry monitoring --Frequent neuro checks --Stroke  swallow screen (NPO until official swallow screen if you think patient will fail)  --PT consult, OT consult, Speech consult --Given possible medication semicompliance versus noncompliance, family should be advised to monitor/administer the patient's home medications after he returns home  '@Electronically'$  signed: Dr. Kerney Elbe  08/20/2020, 8:59 AM

## 2020-08-20 NOTE — ED Notes (Signed)
Introduced self to pt. Pt expresses anxiety toward MRI machine. Reassured about exam and benefits. PT agrees to try test with meds

## 2020-08-20 NOTE — ED Notes (Signed)
PT moved from chair to bed and medicated with night time meds

## 2020-08-20 NOTE — ED Notes (Signed)
Patient transported to MRI 

## 2020-08-20 NOTE — ED Triage Notes (Signed)
Was at the house this morning when pt daughter in law noticed a decline in mentation, slurred speech, left sided weakness and left facial droop lives at home alone on blood thinners

## 2020-08-20 NOTE — ED Notes (Signed)
Pharm called about meds

## 2020-08-20 NOTE — ED Notes (Signed)
Daughter in law Amy called and updated. Family just left for the night. PT in recliner in room watching TV with no request at this time

## 2020-08-20 NOTE — ED Provider Notes (Addendum)
[facility] EMERGENCY DEPARTMENT Provider Note   CSN: [id number] Arrival date & time: 08/20/20  [id number]  An emergency department physician performed an initial assessment on this suspected stroke patient at 54.  History Chief Complaint  Patient presents with   Code Stroke    Lonnie Boyd is a 80 y.o. male.  Patient from home where he lives with family. He was LKW at 0750 and now complaining of left sided weakness, facial droop, altered mental status, and slurred speech.  Stroke team is at bedside.      Past Medical History:  Diagnosis Date   Acute blood loss anemia    Anemia-HEME positive    CAD (coronary artery disease)    CAD- prior PCIs- cath- moderate CAD 09/22/16    Cardiac arrest with ventricular fibrillation ([place])    Cardiomyopathy ([place])    Dementia ([place]) 09/04/2019   Dizziness 06/23/2017   Dyslipidemia 05/15/2015   Falls 05/24/2017   Gastrointestinal hemorrhage    Hyperlipemia    Ischemic cardiomyopathy    Multiple closed fractures of ribs of both sides    NSTEMI (non-ST elevated myocardial infarction) ([place]) 09/20/2016   OSA (obstructive sleep apnea) 05/04/2019   PAF (paroxysmal atrial fibrillation) ([place]) 09/25/2016   Permanent atrial fibrillation ([place]) 03/01/2019   Torsades de pointes ([place])    Ventral hernia     Patient Active Problem List   Diagnosis Date Noted   Ventral hernia    Hyperlipemia    CAD (coronary artery disease)    Dementia ([place]) 09/04/2019   OSA (obstructive sleep apnea) 05/04/2019   Permanent atrial fibrillation ([place]) 03/01/2019   Dizziness 06/23/2017   Falls 05/24/2017   Cardiomyopathy ([place]) 02/15/2017   Acute blood loss anemia    Gastrointestinal hemorrhage    Multiple closed fractures of ribs of both sides    PAF (paroxysmal atrial fibrillation) ([place]) 09/25/2016   Anemia-HEME positive    NSTEMI (non-ST elevated myocardial infarction) ([place]) 09/20/2016   Ischemic cardiomyopathy    CAD- prior PCIs- cath- moderate CAD  09/22/16    Torsades de pointes [facility])    Cardiac arrest with ventricular fibrillation ([place])    Dyslipidemia 05/15/2015    Past Surgical History:  Procedure Laterality Date   KIDNEY SURGERY     LEFT HEART CATH AND CORONARY ANGIOGRAPHY N/A 09/20/2016   Procedure: LEFT HEART CATH AND CORONARY ANGIOGRAPHY;  Surgeon: Belva Crome, MD;  Location: [facility];  Service: Cardiovascular;  Laterality: N/A;       Family History  Problem Relation Age of Onset   Cancer Father    Hypertension Father    Heart failure Father    Leukemia Sister     Social History   Tobacco Use   Smoking status: Never   Smokeless tobacco: Never  Vaping Use   Vaping Use: Never used  Substance Use Topics   Alcohol use: No   Drug use: No    Home Medications Prior to Admission medications   Medication Sig Start Date End Date Taking? Authorizing Provider  B Complex Vitamins (VITAMIN B-COMPLEX PO) Take 1 tablet by mouth daily.   Yes [provider]  Cholecalciferol (VITAMIN D3 PO) Take 1 tablet by mouth daily.   Yes [provider]  ELIQUIS 5 MG TABS tablet TAKE ONE TABLET BY MOUTH TWICE DAILY 10/04/19  Yes Park Liter, MD  losartan (COZAAR) 50 MG tablet TAKE ONE TABLET BY MOUTH DAILY 05/29/20  Yes Park Liter, MD  memantine (  NAMENDA) 10 MG tablet Take 10 mg by mouth 2 (two) times daily. 09/24/19  Yes [provider]  albuterol (PROVENTIL) (2.5 MG/3ML) 0.083% nebulizer solution USE 1 VIAL VIA NEBULIZER 4 TIMES DAILY as needed for shortness of breath 08/18/16   [provider]  ALPRAZolam (XANAX) 0.25 MG tablet Take 0.25 mg by mouth 2 (two) times daily as needed. for anxiety 07/19/17   [provider]  atorvastatin (LIPITOR) 40 MG tablet TAKE TWO TABLETS BY MOUTH EVERY DAY AT 6 PM 02/27/18   Park Liter, MD  clopidogrel (PLAVIX) 75 MG tablet Take 75 mg by mouth daily.    [provider]  donepezil (ARICEPT) 5 MG tablet Take 5 mg by  mouth at bedtime. 08/24/19   [provider]  escitalopram (LEXAPRO) 10 MG tablet Take 10 mg by mouth daily. 08/31/19   [provider]  Isosorbide Mononitrate (IMDUR PO) Take by mouth.    [provider]  metoprolol succinate (TOPROL-XL) 25 MG 24 hr tablet Take 1 tablet (25 mg total) by mouth daily. Take with or immediately following a meal. 08/29/18   Park Liter, MD  nitroGLYCERIN (NITROSTAT) 0.4 MG SL tablet Place 1 tablet (0.4 mg total) under the tongue every 5 (five) minutes x 3 doses as needed for chest pain. Patient not taking: No sig reported 09/25/16   Erlene Quan, PA-C  Pantoprazole Sodium (PROTONIX PO) Take by mouth.    [provider]  PRESCRIPTION MEDICATION Atrovent    [provider]  Zolpidem Tartrate (AMBIEN PO) Take by mouth.    [provider]    Allergies    Patient has no known allergies.  Review of Systems   Review of Systems  Unable to perform ROS: Acuity of condition   Physical Exam Updated Vital Signs BP (!) 162/150   Pulse (!) 55   Temp 97.6 F (36.4 C) (Oral)   Resp 12   Ht '5\' 8"'$  (1.727 m)   Wt 70.6 kg   SpO2 95%   BMI 23.67 kg/m   Physical Exam Constitutional:      Appearance: He is well-developed.  HENT:     Head: Normocephalic.     Nose: Nose normal.  Eyes:     Extraocular Movements: Extraocular movements intact.  Cardiovascular:     Rate and Rhythm: Normal rate.  Pulmonary:     Effort: Pulmonary effort is normal.  Skin:    Coloration: Skin is not jaundiced.  Neurological:     Mental Status: He is alert.     Comments: Patient has left-sided neglect left-sided weakness.  Otherwise awake alert answering questions appears somewhat confused.    ED Results / Procedures / Treatments   Labs (all labs ordered are listed, but only abnormal results are displayed) Labs Reviewed  COMPREHENSIVE METABOLIC PANEL - Abnormal; Notable for the following components:      Result Value    Creatinine, Ser 1.40 (*)    Calcium 8.5 (*)    Total Protein 6.4 (*)    GFR, Estimated 50 (*)    All other components within normal limits  URINALYSIS, ROUTINE W REFLEX MICROSCOPIC - Abnormal; Notable for the following components:   Specific Gravity, Urine 1.031 (*)    All other components within normal limits  I-STAT CHEM 8, ED - Abnormal; Notable for the following components:   Calcium, Ion 1.03 (*)    All other components within normal limits  RESP PANEL BY RT-PCR (FLU A&B, COVID) ARPGX2  ETHANOL  CBC  DIFFERENTIAL  RAPID URINE DRUG SCREEN, HOSP PERFORMED  PROTIME-INR  APTT    EKG EKG Interpretation  Date/Time:  Wednesday August 20 2020 09:27:33 EDT Ventricular Rate:  65 PR Interval:    QRS Duration: 108 QT Interval:  432 QTC Calculation: 450 R Axis:   -29 Text Interpretation: Atrial fibrillation Inferior infarct, old Anterior infarct, old Confirmed by Thamas Jaegers ([id number]) on 08/20/2020 10:51:18 AM  Radiology CT HEAD CODE STROKE WO CONTRAST  Result Date: 08/20/2020 CLINICAL DATA:  Code stroke.  80 year old male. EXAM: CT HEAD WITHOUT CONTRAST TECHNIQUE: Contiguous axial images were obtained from the base of the skull through the vertex without intravenous contrast. COMPARISON:  Head CT 05/18/2017. FINDINGS: Study is mildly degraded by motion artifact despite repeated imaging attempts. Brain: Cerebral volume is stable since 2019. No midline shift, ventriculomegaly, mass effect, evidence of mass lesion, intracranial hemorrhage or evidence of cortically based acute infarction. Gray-white matter differentiation remains within normal limits for age. Minimal white matter hypodensity for age. No cortical encephalomalacia identified. Vascular: Calcified atherosclerosis at the skull base. No suspicious intracranial vascular hyperdensity. Skull: No acute osseous abnormality identified. Sinuses/Orbits: Visualized paranasal sinuses and mastoids are stable and well aerated. Other: Visualized  orbits and scalp soft tissues are within normal limits. ASPECTS [facility] Stroke Program Early CT Score) Total score (0-10 with 10 being normal): 10 IMPRESSION: 1. Mildly degraded by motion with no acute cortically based infarct or acute intracranial hemorrhage identified. ASPECTS 10. 2. These results were communicated to Dr. Cheral Marker at 9:15 am on 08/20/2020 by text page via the [facility] messaging system. Electronically Signed   By: Genevie Ann M.D.   On: 08/20/2020 09:16   CT ANGIO HEAD CODE STROKE  Result Date: 08/20/2020 CLINICAL DATA:  Code stroke.  80 year old male. EXAM: CT ANGIOGRAPHY HEAD AND NECK TECHNIQUE: Multidetector CT imaging of the head and neck was performed using the standard protocol during bolus administration of intravenous contrast. Multiplanar CT image reconstructions and MIPs were obtained to evaluate the vascular anatomy. Carotid stenosis measurements (when applicable) are obtained utilizing NASCET criteria, using the distal internal carotid diameter as the denominator. CONTRAST:  36m OMNIPAQUE IOHEXOL 350 MG/ML SOLN COMPARISON:  Plain head CT 0904 hours today. FINDINGS: CTA NECK Skeleton: Advanced lower cervical spine disc and endplate degeneration. Mild chronic appearing T3 superior endplate compression. No acute osseous abnormality identified. Upper chest: Mild generalized peribronchial thickening in the upper lobes. No superior mediastinal lymphadenopathy. Other neck: Negative. Aortic arch: 3 vessel arch configuration with minimal arch atherosclerosis. Right carotid system: Tortuous brachiocephalic artery without plaque or stenosis. Negative right CCA origin. Mild plaque at the right carotid bifurcation without stenosis. Mildly tortuous cervical right ICA to the skull base. Left carotid system: Mildly tortuous but otherwise negative proximal left CCA. Mild calcified plaque at the left ICA origin without stenosis. Mildly tortuous cervical left ICA. Vertebral arteries: Mild proximal right  subclavian artery plaque without stenosis. Normal right vertebral artery origin. Patent right vertebral artery to the skull base without plaque or stenosis. Mild soft and calcified plaque of the proximal left subclavian artery without stenosis. Soft plaque and mild stenosis at the left vertebral artery origin on series 8, image 176. Tortuous left V1 segment. Fairly codominant left vertebral artery is otherwise patent and normal to the skull base. CTA HEAD Posterior circulation: Patent codominant distal vertebral arteries to the vertebrobasilar junction without stenosis. Normal left PICA origin. Right PICA appears diminutive or absent. Patent basilar artery without stenosis. Patent SCA origins.  Fetal type right PCA origin. Small left posterior communicating artery. Mild left P1 segment irregularity and stenosis. Otherwise bilateral PCA branches are within normal limits. Anterior circulation: Both ICA siphons are patent. Bilateral siphon calcified plaque. Mild to moderate supraclinoid ICA stenosis on the left (series 8, image 100). Normal left posterior communicating and ophthalmic artery origins. On the right moderate supraclinoid stenosis (series 9, image 95) due to calcified plaque situated between normal right ophthalmic and posterior communicating artery origins. Patent carotid termini, MCA and ACA origins. Normal anterior communicating artery. Bilateral ACA branches are within normal limits. Left MCA M1 segment is mildly tortuous. Left MCA bifurcation is patent without stenosis. Left MCA branches are within normal limits. Right MCA M1 segment is mildly tortuous and patent without stenosis. Patent right MCA bi or trifurcation without stenosis. Dominant posterior right MCA M2 branch (series 12, image 14). No right MCA branch stenosis or occlusion identified. Venous sinuses: Early contrast timing, grossly patent. Anatomic variants: Fetal type right PCA origin. Preliminary report of this exam discussed by telephone  with Dr. Kerney Elbe on 08/20/2020 at 0923 hours. Review of the MIP images confirms the above findings IMPRESSION: 1. Negative for large vessel occlusion. 2. Mild atherosclerosis in the neck and no significant extracranial stenosis. 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 These results were communicated to Dr. Cheral Marker at 9:27 am on 08/20/2020 by text page via the [facility] messaging system. Electronically Signed   By: Genevie Ann M.D.   On: 08/20/2020 09:29   CT ANGIO NECK CODE STROKE  Result Date: 08/20/2020 CLINICAL DATA:  Code stroke.  80 year old male. EXAM: CT ANGIOGRAPHY HEAD AND NECK TECHNIQUE: Multidetector CT imaging of the head and neck was performed using the standard protocol during bolus administration of intravenous contrast. Multiplanar CT image reconstructions and MIPs were obtained to evaluate the vascular anatomy. Carotid stenosis measurements (when applicable) are obtained utilizing NASCET criteria, using the distal internal carotid diameter as the denominator. CONTRAST:  62m OMNIPAQUE IOHEXOL 350 MG/ML SOLN COMPARISON:  Plain head CT 0904 hours today. FINDINGS: CTA NECK Skeleton: Advanced lower cervical spine disc and endplate degeneration. Mild chronic appearing T3 superior endplate compression. No acute osseous abnormality identified. Upper chest: Mild generalized peribronchial thickening in the upper lobes. No superior mediastinal lymphadenopathy. Other neck: Negative. Aortic arch: 3 vessel arch configuration with minimal arch atherosclerosis. Right carotid system: Tortuous brachiocephalic artery without plaque or stenosis. Negative right CCA origin. Mild plaque at the right carotid bifurcation without stenosis. Mildly tortuous cervical right ICA to the skull base. Left carotid system: Mildly tortuous but otherwise negative proximal left CCA. Mild calcified plaque at the left ICA origin without stenosis. Mildly tortuous cervical left ICA. Vertebral arteries: Mild  proximal right subclavian artery plaque without stenosis. Normal right vertebral artery origin. Patent right vertebral artery to the skull base without plaque or stenosis. Mild soft and calcified plaque of the proximal left subclavian artery without stenosis. Soft plaque and mild stenosis at the left vertebral artery origin on series 8, image 176. Tortuous left V1 segment. Fairly codominant left vertebral artery is otherwise patent and normal to the skull base. CTA HEAD Posterior circulation: Patent codominant distal vertebral arteries to the vertebrobasilar junction without stenosis. Normal left PICA origin. Right PICA appears diminutive or absent. Patent basilar artery without stenosis. Patent SCA origins. Fetal type right PCA origin. Small left posterior communicating artery. Mild left P1 segment irregularity and stenosis. Otherwise bilateral PCA branches are within normal limits. Anterior circulation: Both ICA  siphons are patent. Bilateral siphon calcified plaque. Mild to moderate supraclinoid ICA stenosis on the left (series 8, image 100). Normal left posterior communicating and ophthalmic artery origins. On the right moderate supraclinoid stenosis (series 9, image 95) due to calcified plaque situated between normal right ophthalmic and posterior communicating artery origins. Patent carotid termini, MCA and ACA origins. Normal anterior communicating artery. Bilateral ACA branches are within normal limits. Left MCA M1 segment is mildly tortuous. Left MCA bifurcation is patent without stenosis. Left MCA branches are within normal limits. Right MCA M1 segment is mildly tortuous and patent without stenosis. Patent right MCA bi or trifurcation without stenosis. Dominant posterior right MCA M2 branch (series 12, image 14). No right MCA branch stenosis or occlusion identified. Venous sinuses: Early contrast timing, grossly patent. Anatomic variants: Fetal type right PCA origin. Preliminary report of this exam  discussed by telephone with Dr. Kerney Elbe on 08/20/2020 at 0923 hours. Review of the MIP images confirms the above findings IMPRESSION: 1. Negative for large vessel occlusion. 2. Mild atherosclerosis in the neck and no significant extracranial stenosis. 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 These results were communicated to Dr. Cheral Marker at 9:27 am on 08/20/2020 by text page via the [facility] messaging system. Electronically Signed   By: Genevie Ann M.D.   On: 08/20/2020 09:29    Procedures .Critical Care  Date/Time: 08/20/2020 1:43 PM Performed by: Luna Fuse, MD Authorized by: Luna Fuse, MD   Critical care provider statement:    Critical care time (minutes):  30   Critical care time was exclusive of:  Separately billable procedures and treating other patients and teaching time   Critical care was necessary to treat or prevent imminent or life-threatening deterioration of the following conditions:  CNS failure or compromise   Medications Ordered in ED Medications  iohexol (OMNIPAQUE) 350 MG/ML injection 60 mL (60 mLs Intravenous Contrast Given 08/20/20 0917)  sodium chloride 0.9 % bolus 1,000 mL (1,000 mLs Intravenous Bolus 08/20/20 1040)    ED Course  I have reviewed the triage vital signs and the nursing notes.  Pertinent labs & imaging results that were available during my care of the patient were reviewed by me and considered in my medical decision making (see chart for details).    MDM Rules/Calculators/A&P                           Patient reports he is on Eliquis, he is not a tPA candidate.  Stroke alert was activated and stroke team at bedside.  Imaging does not show large vessel occlusion.  Will be worked up further for acute stroke.  Final Clinical Impression(s) / ED Diagnoses Final diagnoses:  Stroke-like symptoms    Rx / DC Orders ED Discharge Orders     None        Luna Fuse, MD 08/20/20 1343    Luna Fuse,  MD 08/20/20 1343

## 2020-08-20 NOTE — ED Notes (Signed)
Pt medicated, moved up in bed and blankets provided. Also updated about MRI

## 2020-08-20 NOTE — Consult Note (Deleted)
NEURO HOSPITALIST  CONSULT   Requesting Physician: Dr. Almyra Free    Chief Complaint: sudden onset left side weakness/facial droop/ slurred speech  History obtained from:  Family  HPI:                                                                                                                                         Lonnie Boyd is an 80 y.o. male with PMH significant for MI (2018), chronic atrial fibrillation on eliquis, OSA, HTN,CAD, dementia who presented to [facility] ED as a code stroke for left sided weakness, facial droop and slurred speech.    Per EMS and report patient was at his house with his daughter and law and son. When he had a sudden onset of facial droop, slurred speech and left side weakness. Per family patient is taking eliquis  and plavix, but has not taken any medication this morning. Denies any falls, HA, cough, SOB or vision changes. Patient does not have a prior stroke history. Per EMS prior to arrival patient began neglecting left side and would not look to the left.  ED course:  BP on arrival: 180/96   BG: 109 CTH: no abnormality CTA: no LVO TPA : no ; on eliquis    Date last known well: Date: 08/20/2020 Time last known well: Time: 07:50 tPA Given: No; d/t patient on eliquis Modified Rankin: Rankin Score=1 NIHSS:3    Past Medical History:  Diagnosis Date   Acute blood loss anemia    Anemia-HEME positive    CAD (coronary artery disease)    CAD- prior PCIs- cath- moderate CAD 09/22/16    Cardiac arrest with ventricular fibrillation ([place])    Cardiomyopathy ([place])    Dementia ([place]) 09/04/2019   Dizziness 06/23/2017   Dyslipidemia 05/15/2015   Falls 05/24/2017   Gastrointestinal hemorrhage    Hyperlipemia    Ischemic cardiomyopathy    Multiple closed fractures of ribs of both sides    NSTEMI (non-ST elevated myocardial infarction) ([place]) 09/20/2016   OSA (obstructive sleep apnea) 05/04/2019   PAF (paroxysmal atrial  fibrillation) ([place]) 09/25/2016   Permanent atrial fibrillation ([place]) 03/01/2019   Torsades de pointes (HCC)    Ventral hernia     Past Surgical History:  Procedure Laterality Date   KIDNEY SURGERY     LEFT HEART CATH AND CORONARY ANGIOGRAPHY N/A 09/20/2016   Procedure: LEFT HEART CATH AND CORONARY ANGIOGRAPHY;  Surgeon: Belva Crome, MD;  Location: [facility];  Service: Cardiovascular;  Laterality: N/A;    Family History  Problem Relation Age of Onset   Cancer Father    Hypertension Father  Heart failure Father    Leukemia Sister          Social History:  reports that he has never smoked. He has never used smokeless tobacco. He reports that he does not drink alcohol and does not use drugs.  Allergies: No Known Allergies  Medications:                                                                                                                           Prior medication list provided by family has been reviewed.  ROS:                                                                                                                                       History obtained from the patient  General ROS: negative for - chills, fatigue, fever, night sweats, weight gain or weight loss Psychological ROS: negative for - , hallucinations, memory difficulties, mood swings or  Ophthalmic ROS: negative for - blurry vision, double vision, eye pain or loss of vision ENT ROS: negative for - epistaxis, nasal discharge, oral lesions, sore throat, tinnitus or vertigo Respiratory ROS: negative for - cough,  shortness of breath or wheezing Cardiovascular ROS: negative for - chest pain, dyspnea on exertion,  Gastrointestinal ROS: negative for - abdominal pain, diarrhea,  nausea/vomiting or stool incontinence Genito-Urinary ROS: negative for - dysuria, hematuria, incontinence or urinary frequency/urgency Musculoskeletal ROS: negative for - joint swelling or muscular weakness Neurological ROS: as  noted in HPI   General Examination:                                                                                                      Blood pressure (!) 185/106, pulse 81, temperature 97.6 F (36.4 C), temperature source Oral, height '5\' 8"'$  (1.727 m), weight 70.6 kg.  Physical Exam  Constitutional: Appears well-developed and well-nourished.  Psych: Affect appropriate to situation Eyes: Normal external eye and conjunctiva. HENT: Normocephalic, no lesions, without  obvious abnormality.   Musculoskeletal-no joint tenderness, deformity or swelling Cardiovascular: Normal rate and regular rhythm.  Respiratory: Effort normal, non-labored breathing saturations WNL GI: Soft.  No distension. There is no tenderness.  Skin: WDI  Neurological Examination Mental Status: Alert, oriented name/age/month, thought content appropriate.  Speech fluent without evidence of aphasia.   Naming, repetition intact. Able to follow simple commands without difficulty. Cranial Nerves: total vision loss left eye, with right gaze preference, but able to cross midline to left with bilateral eyes.  ptosis not present, extra-ocular motions intact bilaterally, pupils equal, round, reactive to light  Equivocal smile, facial light touch sensation normal bilaterally hearing loss/ bilateral hearing aids at baseline uvula rises midline eft shoulder shrug is less than right midline tongue extension Motor: Right : Upper extremity   5/5  Left:     Upper extremity   5/5  Lower extremity   5/5   Lower extremity   5/5 Tone and bulk:normal tone throughout; no atrophy noted Sensory: decreased to light touch in left arm and leg Deep Tendon Reflexes: 2+ and symmetric biceps and patella Plantars: Right: downgoing   Left: downgoing Cerebellar: No ataxia present  + rolling test  right > left Gait: deferred   Lab Results: Basic Metabolic Panel: Recent Labs  Lab 08/20/20 0858 08/20/20 0901  NA 138 141  K 4.4 4.2  CL 108  108  CO2 22  --   GLUCOSE 96 97  BUN 19 23  CREATININE 1.40* 1.20  CALCIUM 8.5*  --     CBC: Recent Labs  Lab 08/20/20 0901  HGB 16.3  HCT 48.0     Imaging: CT HEAD CODE STROKE WO CONTRAST  Result Date: 08/20/2020 CLINICAL DATA:  Code stroke.  80 year old male. EXAM: CT HEAD WITHOUT CONTRAST TECHNIQUE: Contiguous axial images were obtained from the base of the skull through the vertex without intravenous contrast. COMPARISON:  Head CT 05/18/2017. FINDINGS: Study is mildly degraded by motion artifact despite repeated imaging attempts. Brain: Cerebral volume is stable since 2019. No midline shift, ventriculomegaly, mass effect, evidence of mass lesion, intracranial hemorrhage or evidence of cortically based acute infarction. Gray-white matter differentiation remains within normal limits for age. Minimal white matter hypodensity for age. No cortical encephalomalacia identified. Vascular: Calcified atherosclerosis at the skull base. No suspicious intracranial vascular hyperdensity. Skull: No acute osseous abnormality identified. Sinuses/Orbits: Visualized paranasal sinuses and mastoids are stable and well aerated. Other: Visualized orbits and scalp soft tissues are within normal limits. ASPECTS [facility] Stroke Program Early CT Score) Total score (0-10 with 10 being normal): 10 IMPRESSION: 1. Mildly degraded by motion with no acute cortically based infarct or acute intracranial hemorrhage identified. ASPECTS 10. 2. These results were communicated to Dr. Cheral Marker at 9:15 am on 08/20/2020 by text page via the [facility] messaging system. Electronically Signed   By: Genevie Ann M.D.   On: 08/20/2020 09:16   CT ANGIO HEAD CODE STROKE  Result Date: 08/20/2020 CLINICAL DATA:  Code stroke.  80 year old male. EXAM: CT ANGIOGRAPHY HEAD AND NECK TECHNIQUE: Multidetector CT imaging of the head and neck was performed using the standard protocol during bolus administration of intravenous contrast. Multiplanar CT image  reconstructions and MIPs were obtained to evaluate the vascular anatomy. Carotid stenosis measurements (when applicable) are obtained utilizing NASCET criteria, using the distal internal carotid diameter as the denominator. CONTRAST:  55m OMNIPAQUE IOHEXOL 350 MG/ML SOLN COMPARISON:  Plain head CT 0904 hours today. FINDINGS: CTA NECK Skeleton: Advanced lower cervical spine disc and  endplate degeneration. Mild chronic appearing T3 superior endplate compression. No acute osseous abnormality identified. Upper chest: Mild generalized peribronchial thickening in the upper lobes. No superior mediastinal lymphadenopathy. Other neck: Negative. Aortic arch: 3 vessel arch configuration with minimal arch atherosclerosis. Right carotid system: Tortuous brachiocephalic artery without plaque or stenosis. Negative right CCA origin. Mild plaque at the right carotid bifurcation without stenosis. Mildly tortuous cervical right ICA to the skull base. Left carotid system: Mildly tortuous but otherwise negative proximal left CCA. Mild calcified plaque at the left ICA origin without stenosis. Mildly tortuous cervical left ICA. Vertebral arteries: Mild proximal right subclavian artery plaque without stenosis. Normal right vertebral artery origin. Patent right vertebral artery to the skull base without plaque or stenosis. Mild soft and calcified plaque of the proximal left subclavian artery without stenosis. Soft plaque and mild stenosis at the left vertebral artery origin on series 8, image 176. Tortuous left V1 segment. Fairly codominant left vertebral artery is otherwise patent and normal to the skull base. CTA HEAD Posterior circulation: Patent codominant distal vertebral arteries to the vertebrobasilar junction without stenosis. Normal left PICA origin. Right PICA appears diminutive or absent. Patent basilar artery without stenosis. Patent SCA origins. Fetal type right PCA origin. Small left posterior communicating artery. Mild left  P1 segment irregularity and stenosis. Otherwise bilateral PCA branches are within normal limits. Anterior circulation: Both ICA siphons are patent. Bilateral siphon calcified plaque. Mild to moderate supraclinoid ICA stenosis on the left (series 8, image 100). Normal left posterior communicating and ophthalmic artery origins. On the right moderate supraclinoid stenosis (series 9, image 95) due to calcified plaque situated between normal right ophthalmic and posterior communicating artery origins. Patent carotid termini, MCA and ACA origins. Normal anterior communicating artery. Bilateral ACA branches are within normal limits. Left MCA M1 segment is mildly tortuous. Left MCA bifurcation is patent without stenosis. Left MCA branches are within normal limits. Right MCA M1 segment is mildly tortuous and patent without stenosis. Patent right MCA bi or trifurcation without stenosis. Dominant posterior right MCA M2 branch (series 12, image 14). No right MCA branch stenosis or occlusion identified. Venous sinuses: Early contrast timing, grossly patent. Anatomic variants: Fetal type right PCA origin. Preliminary report of this exam discussed by telephone with Dr. Kerney Elbe on 08/20/2020 at 0923 hours. Review of the MIP images confirms the above findings IMPRESSION: 1. Negative for large vessel occlusion. 2. Mild atherosclerosis in the neck and no significant extracranial stenosis. 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 These results were communicated to Dr. Cheral Marker at 9:27 am on 08/20/2020 by text page via the [facility] messaging system. Electronically Signed   By: Genevie Ann M.D.   On: 08/20/2020 09:29   CT ANGIO NECK CODE STROKE  Result Date: 08/20/2020 CLINICAL DATA:  Code stroke.  80 year old male. EXAM: CT ANGIOGRAPHY HEAD AND NECK TECHNIQUE: Multidetector CT imaging of the head and neck was performed using the standard protocol during bolus administration of intravenous contrast.  Multiplanar CT image reconstructions and MIPs were obtained to evaluate the vascular anatomy. Carotid stenosis measurements (when applicable) are obtained utilizing NASCET criteria, using the distal internal carotid diameter as the denominator. CONTRAST:  62m OMNIPAQUE IOHEXOL 350 MG/ML SOLN COMPARISON:  Plain head CT 0904 hours today. FINDINGS: CTA NECK Skeleton: Advanced lower cervical spine disc and endplate degeneration. Mild chronic appearing T3 superior endplate compression. No acute osseous abnormality identified. Upper chest: Mild generalized peribronchial thickening in the upper lobes. No superior mediastinal lymphadenopathy. Other  neck: Negative. Aortic arch: 3 vessel arch configuration with minimal arch atherosclerosis. Right carotid system: Tortuous brachiocephalic artery without plaque or stenosis. Negative right CCA origin. Mild plaque at the right carotid bifurcation without stenosis. Mildly tortuous cervical right ICA to the skull base. Left carotid system: Mildly tortuous but otherwise negative proximal left CCA. Mild calcified plaque at the left ICA origin without stenosis. Mildly tortuous cervical left ICA. Vertebral arteries: Mild proximal right subclavian artery plaque without stenosis. Normal right vertebral artery origin. Patent right vertebral artery to the skull base without plaque or stenosis. Mild soft and calcified plaque of the proximal left subclavian artery without stenosis. Soft plaque and mild stenosis at the left vertebral artery origin on series 8, image 176. Tortuous left V1 segment. Fairly codominant left vertebral artery is otherwise patent and normal to the skull base. CTA HEAD Posterior circulation: Patent codominant distal vertebral arteries to the vertebrobasilar junction without stenosis. Normal left PICA origin. Right PICA appears diminutive or absent. Patent basilar artery without stenosis. Patent SCA origins. Fetal type right PCA origin. Small left posterior  communicating artery. Mild left P1 segment irregularity and stenosis. Otherwise bilateral PCA branches are within normal limits. Anterior circulation: Both ICA siphons are patent. Bilateral siphon calcified plaque. Mild to moderate supraclinoid ICA stenosis on the left (series 8, image 100). Normal left posterior communicating and ophthalmic artery origins. On the right moderate supraclinoid stenosis (series 9, image 95) due to calcified plaque situated between normal right ophthalmic and posterior communicating artery origins. Patent carotid termini, MCA and ACA origins. Normal anterior communicating artery. Bilateral ACA branches are within normal limits. Left MCA M1 segment is mildly tortuous. Left MCA bifurcation is patent without stenosis. Left MCA branches are within normal limits. Right MCA M1 segment is mildly tortuous and patent without stenosis. Patent right MCA bi or trifurcation without stenosis. Dominant posterior right MCA M2 branch (series 12, image 14). No right MCA branch stenosis or occlusion identified. Venous sinuses: Early contrast timing, grossly patent. Anatomic variants: Fetal type right PCA origin. Preliminary report of this exam discussed by telephone with Dr. Kerney Elbe on 08/20/2020 at 0923 hours. Review of the MIP images confirms the above findings IMPRESSION: 1. Negative for large vessel occlusion. 2. Mild atherosclerosis in the neck and no significant extracranial stenosis. 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 These results were communicated to Dr. Cheral Marker at 9:27 am on 08/20/2020 by text page via the [facility] messaging system. Electronically Signed   By: Genevie Ann M.D.   On: 08/20/2020 09:29       Laurey Morale, MSN, NP-C [facility] [phone]  08/20/2020, 9:54 AM   Attending physician note to follow with Assessment and plan .   Assessment: 80 y.o. male with PMH significant for MI (2018), chronic atrial fibrillation on  eliquis, OSA, HTN who presented to [facility] ED as a code stroke for left sided weakness, facial droop and slurred speech. On initial presentation patient had a gaze preference to right and was semi neglecting left side. 1.Exam revealed left eye blindness, decreased sensation in left arm and leg.  2.CTH did not show a hemorrhage. The CTA did not show an LVO  thus patient not a candidate for IR. Patient symptoms began improving. Patient was not a candidate for TPA given that he is on eliquis.  3.Given risk factors and age will proceed with CVA work-up.     Stroke Risk Factors - atrial fibrillation and hypertension    Recommendations: -- BP  goal : modified Permissive HTN  if SBP > 180 please treat BP. If SBP< 120 please give IV fluids --MRI Brain  --Echocardiogram -- continue eliquis and plavix as prescribed --  if LDL > 70; start high intensity statin -- HgbA1c, fasting lipid panel -- PT consult, OT consult, Speech consult --Telemetry monitoring --Frequent neuro checks --Stroke swallow screen ( NPO until official swallow screen if you think patient will fail)   Laurey Morale, MSN, NP-C [facility] [phone]    --Please page the Stroke team from 8am-4pm.   You can look them up on [email or web address]

## 2020-08-20 NOTE — Code Documentation (Signed)
Stroke Response Nurse Documentation Code Documentation  Lonnie Boyd is a 80 y.o. male arriving to [facility] ED via [facility] on 08/20/20 with past medical hx of coronary artery disease, cardiac arrest with ventricular fibrillations, cardiomyopathy, dementia, dyslipidemia, GI bleed, obstructive sleep apnea, and atrial fibrillation. Prescribed Eliquis (apixaban) daily, unclear if taken as prescribed. Code stroke was activated by EMS. Patient from home where he lives with family. He was LKW at 0750 and now complaining of left sided weakness, facial droop, altered mental status, and slurred speech.   Stroke team at the bedside on patient arrival. Labs drawn and patient cleared for CT by EDP. Patient to CT with team. NIHSS 6, see documentation for details and code stroke times. Patient with disorientation, left hemianopia, decreased sensation on the left and left neglect on exam. The following imaging was completed: CT, CTA head and neck.   Patient is not a candidate for tPA. Dr. Cheral Marker spoke with family and pharmacist. Unclear when last dose of Eliquis was taken. Patient symptoms resolving upon arrival to ED room. Care/Plan Q2 assessments. Bedside handoff with ED RN [place].    Earma Reading  Stroke Response RN

## 2020-08-21 ENCOUNTER — Inpatient Hospital Stay (HOSPITAL_BASED_OUTPATIENT_CLINIC_OR_DEPARTMENT_OTHER): Payer: PPO

## 2020-08-21 DIAGNOSIS — I255 Ischemic cardiomyopathy: Secondary | ICD-10-CM

## 2020-08-21 DIAGNOSIS — G459 Transient cerebral ischemic attack, unspecified: Secondary | ICD-10-CM

## 2020-08-21 DIAGNOSIS — I63411 Cerebral infarction due to embolism of right middle cerebral artery: Secondary | ICD-10-CM

## 2020-08-21 DIAGNOSIS — I6310 Cerebral infarction due to embolism of unspecified precerebral artery: Secondary | ICD-10-CM

## 2020-08-21 DIAGNOSIS — I639 Cerebral infarction, unspecified: Secondary | ICD-10-CM | POA: Diagnosis present

## 2020-08-21 LAB — ECHOCARDIOGRAM COMPLETE
AR max vel: 1.92 cm2
AV Area VTI: 1.6 cm2
AV Area mean vel: 1.83 cm2
AV Mean grad: 2 mmHg
AV Peak grad: 4.1 mmHg
Ao pk vel: 1.01 m/s
Calc EF: 33.9 %
Height: 68 in
P 1/2 time: 338 msec
S' Lateral: 4 cm
Single Plane A2C EF: 21.2 %
Single Plane A4C EF: 42.6 %
Weight: 2490.32 oz

## 2020-08-21 LAB — LIPID PANEL
Cholesterol: 184 mg/dL (ref 0–200)
HDL: 30 mg/dL — ABNORMAL LOW (ref >40)
LDL Cholesterol: 126 mg/dL — ABNORMAL HIGH (ref 0–99)
Total CHOL/HDL Ratio: 6.1 RATIO
Triglycerides: 140 mg/dL (ref <150)
VLDL: 28 mg/dL (ref 0–40)

## 2020-08-21 LAB — BASIC METABOLIC PANEL
Anion gap: 6 (ref 5–15)
BUN: 19 mg/dL (ref 8–23)
CO2: 23 mmol/L (ref 22–32)
Calcium: 8.4 mg/dL — ABNORMAL LOW (ref 8.9–10.3)
Chloride: 107 mmol/L (ref 98–111)
Creatinine, Ser: 1.43 mg/dL — ABNORMAL HIGH (ref 0.61–1.24)
GFR, Estimated: 49 mL/min — ABNORMAL LOW (ref >60)
Glucose, Bld: 92 mg/dL (ref 70–99)
Potassium: 4.1 mmol/L (ref 3.5–5.1)
Sodium: 136 mmol/L (ref 135–145)

## 2020-08-21 LAB — GLUCOSE, CAPILLARY: Glucose-Capillary: 94 mg/dL (ref 70–99)

## 2020-08-21 MED ORDER — ATORVASTATIN CALCIUM 40 MG PO TABS: 80.0000 mg | ORAL_TABLET | Freq: Every evening | ORAL | 0 refills | Status: DC

## 2020-08-21 NOTE — Evaluation (Signed)
Occupational Therapy Evaluation Patient Details Name: Lonnie Boyd MRN: [id number] DOB: 24-Feb-1940 Today's Date: 08/21/2020    History of Present Illness 80 y.o. male presented with new onset of left-sided weakness, slurred speech and confusion. 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 fib on Eliquis, HTN, new onset of dementia 2021, HLD,   Clinical Impression   Pt is functioning at his baseline in mobility and ADL. No OT needs. He lives with his supportive family who provides 24 hour care.     Follow Up Recommendations  No OT follow up    Equipment Recommendations  None recommended by OT    Recommendations for Other Services       Precautions / Restrictions Precautions Precautions: None;Fall Precaution Comments: denies falls      Mobility Bed Mobility Overal bed mobility: Independent                  Transfers Overall transfer level: Independent Equipment used: None                  Balance Overall balance assessment: Mild deficits observed, not formally tested;Needs assistance Sitting-balance support: No upper extremity supported;Feet unsupported Sitting balance-Leahy Scale: Good     Standing balance support: No upper extremity supported Standing balance-Leahy Scale: Good              ADL either performed or assessed with clinical judgement   ADL Overall ADL's : At baseline                                       General ADL Comments: supervision for safety     Vision Baseline Vision/History: Wears glasses Patient Visual Report: No change from baseline       Perception     Praxis      Pertinent Vitals/Pain Pain Assessment: No/denies pain     Hand Dominance Right   Extremity/Trunk Assessment Upper Extremity Assessment Upper Extremity Assessment: Overall WFL for tasks  assessed   Lower Extremity Assessment Lower Extremity Assessment: Defer to PT evaluation   Cervical / Trunk Assessment Cervical / Trunk Assessment: Kyphotic   Communication Communication Communication: HOH   Cognition Arousal/Alertness: Awake/alert Behavior During Therapy: WFL for tasks assessed/performed Overall Cognitive Status: History of cognitive impairments - at baseline                                   General Comments  Grandaughter present. Reports he appears at baseline to her    Exercises     Shoulder Instructions      Home Living Family/patient expects to be discharged to:: Private residence Living Arrangements: Children Available Help at Discharge: Family;Available 24 hours/day Type of Home: House             Bathroom Shower/Tub: Teacher, early years/pre: Handicapped height     Home Equipment: Cane - single point;Grab bars - tub/shower          Prior Functioning/Environment Level of Independence: Needs assistance  Gait / Transfers Assistance Needed: independent with basic mobiltiy; uses a cane at times (especially on uneven ground) ADL's / Homemaking Assistance Needed: assisted for IADL and meds  OT Problem List:        OT Treatment/Interventions:      OT Goals(Current goals can be found in the care plan section) Acute Rehab OT Goals Patient Stated Goal: Go home  OT Frequency:     Barriers to D/C:            Co-evaluation              AM-PAC OT "6 Clicks" Daily Activity     Outcome Measure Help from another person eating meals?: None Help from another person taking care of personal grooming?: None Help from another person toileting, which includes using toliet, bedpan, or urinal?: None Help from another person bathing (including washing, rinsing, drying)?: None Help from another person to put on and taking off regular upper body clothing?: None Help from another person to put on and taking  off regular lower body clothing?: None 6 Click Score: 24   End of Session    Activity Tolerance:   Patient left: in chair;with call bell/phone within reach;with family/visitor present  OT Visit Diagnosis: Muscle weakness (generalized) (M62.81)                Time: [id number] OT Time Calculation (min): 16 min Charges:  OT General Charges $OT Visit: 1 Visit OT Evaluation $OT Eval Low Complexity: 1 Low  Nestor Lewandowsky, OTR/L Acute Rehabilitation Services Pager: [phone] Office: [phone]   Malka So 08/21/2020, 10:06 AM

## 2020-08-21 NOTE — Care Management Obs Status (Signed)
[place] NOTIFICATION   Patient Details  Name: Lonnie Boyd MRN: [id number] Date of Birth: [birth year]   Medicare Observation Status Notification Given:  Yes    Bethann Berkshire, [place] 08/21/2020, 4:47 PM

## 2020-08-21 NOTE — Care Management CC44 (Signed)
Condition Code 44 Documentation Completed  Patient Details  Name: RYYAN AREDONDO MRN: [id number] Date of Birth: Jul 16, 1940   Condition Code 44 given:  Yes Patient signature on Condition Code 44 notice:  Yes Documentation of 2 MD's agreement:  Yes Code 44 added to claim:  Yes   Bethann Berkshire, [place] 08/21/2020, 4:46 PM

## 2020-08-21 NOTE — Plan of Care (Signed)

## 2020-08-21 NOTE — TOC Initial Note (Signed)
Transition of Care [facility]) - Initial/Assessment Note    Patient Details  Name: Lonnie Boyd MRN: [id number] Date of Birth: [birth year]  Transition of Care [facility]) CM/SW Contact:    Bethann Berkshire, [place] Phone Number: 08/21/2020, 3:39 PM  Clinical Narrative:   CSW met with pt, pt son, and pt grandaughter to discuss [place]. Family is agreeable to [facility] RN for medication management. Pt currently lives at home with his son and daughter in law who are his caretakers. They report no DME needs. No preference in [facility] agency.   CSW arrranged HH with [facility].                   Expected Discharge Plan: [place] Barriers to Discharge: No Barriers Identified   Patient Goals and CMS Choice     Choice offered to / list presented to : Adult Children  Expected Discharge Plan and Services Expected Discharge Plan: [place] Choice: [place] arrangements for the past 2 months: Single Family Home Expected Discharge Date: 08/21/20                                    Prior Living Arrangements/Services Living arrangements for the past 2 months: Single Family Home Lives with:: Adult Children          Need for Family Participation in Patient Care: Yes (Comment) Care giver support system in place?: Yes (comment)      Activities of Daily Living      Permission Sought/Granted                  Emotional Assessment Appearance:: Appears stated age Attitude/Demeanor/Rapport: Other (comment) (content) Affect (typically observed): Accepting, Quiet Orientation: : Oriented to Self, Oriented to Place, Oriented to  Time, Oriented to Situation Alcohol / Substance Use: Not Applicable Psych Involvement: No (comment)  Admission diagnosis:  TIA (transient ischemic attack) [G45.9] PNA (pneumonia) [J18.9] Stroke-like symptoms [R29.90] CVA (cerebral vascular accident) [facility]) [I63.9] Patient Active Problem List   Diagnosis Date  Noted   CVA (cerebral vascular accident) ([place]) 08/21/2020   PNA (pneumonia) 08/20/2020   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   TIA (transient ischemic attack) 08/20/2020   Ventral hernia    Hyperlipemia    CAD (coronary artery disease)    Dementia ([place]) 09/04/2019   OSA (obstructive sleep apnea) 05/04/2019   Permanent atrial fibrillation ([place]) 03/01/2019   Dizziness 06/23/2017   Falls 05/24/2017   Cardiomyopathy ([place]) 02/15/2017   Acute blood loss anemia    Gastrointestinal hemorrhage    Multiple closed fractures of ribs of both sides    PAF (paroxysmal atrial fibrillation) ([place]) 09/25/2016   Anemia-HEME positive    NSTEMI (non-ST elevated myocardial infarction) ([place]) 09/20/2016   Ischemic cardiomyopathy    CAD- prior PCIs- cath- moderate CAD 09/22/16    [place] [facility])    Cardiac arrest with ventricular fibrillation ([place])    Dyslipidemia 05/15/2015   PCP:  [name], MD Pharmacy:   [facility] [address] [place] [address] Phone: [phone] Fax: [phone]     Social Determinants of Health (SDOH) Interventions    Readmission Risk Interventions No flowsheet data found.

## 2020-08-21 NOTE — Progress Notes (Addendum)
STROKE TEAM PROGRESS NOTE   ATTENDING NOTE: I reviewed above note and agree with the assessment and plan. Pt was seen and examined.   80 year old male with history of CAD/non-STEMI on Plavix, cardiac arrest with V. fib, PAF on Eliquis, cardiomyopathy, dementia, HLD, OSA admitted for left-sided weakness, slurred speech, confusion and left facial droop.  CT no acute abnormality.  CTA head and neck showed bilateral ICA siphon moderate stenosis.  MRI showed scattered right MCA infarcts.  EF 30 to 35% down from 45 to 50% in 11/2019.  UDS negative.  LDL 126, A1c pending.  Creatinine 1.2.  On neuro exam, patient neuro intact no focal deficit except hard of hearing.  Patient admitted that he most likely did not take Eliquis regularly.  Now his daughter-in-law will help him with medication.  Etiology for patient stroke likely due to A. fib not compliant with Eliquis.  Will resume home Eliquis and Plavix.  Increase Lipitor from 40 to 80 for HLD management.  Continue home Aricept and Namenda.  PT/OT no recommendations.  Given decreased EF from 6+ months ago but asymptomatic, recommend outpatient follow-up with cardiology for further management.  For detailed assessment and plan, please refer to above as I have made changes wherever appropriate.   Neurology will sign off. Please call with questions. Pt will follow up with stroke clinic NP at [facility] in about 4 weeks. Thanks for the consult.   Rosalin Hawking, MD PhD Stroke Neurology 08/21/2020 4:03 PM    INTERVAL HISTORY His family is at the bedside.  Patient sitting up in chair. Neuro exam improved from yesterday. Patient wants to go home.   Vitals:   08/20/20 2345 08/21/20 0000 08/21/20 0059 08/21/20 0459  BP: (!) 141/84  (!) 139/98 (!) 145/89  Pulse: 71  75 71  Resp: '18  18 18  '$ Temp:  97.9 F (36.6 C) 99.6 F (37.6 C) 98.4 F (36.9 C)  TempSrc:  Oral Oral   SpO2: 98%  96% 96%  Weight:      Height:       CBC:  Recent Labs  Lab 08/20/20 0858  08/20/20 0901  WBC 8.8  --   NEUTROABS 4.1  --   HGB 16.3 16.3  HCT 49.2 48.0  MCV 99.0  --   PLT PLATELET CLUMPS NOTED ON SMEAR, UNABLE TO ESTIMATE  --    Basic Metabolic Panel:  Recent Labs  Lab 08/20/20 0858 08/20/20 0901 08/21/20 0200  NA 138 141 136  K 4.4 4.2 4.1  CL 108 108 107  CO2 22  --  23  GLUCOSE 96 97 92  BUN '19 23 19  '$ CREATININE 1.40* 1.20 1.43*  CALCIUM 8.5*  --  8.4*   Lipid Panel:  Recent Labs  Lab 08/21/20 0200  CHOL 184  TRIG 140  HDL 30*  CHOLHDL 6.1  VLDL 28  LDLCALC 126*   HgbA1c: pending Urine Drug Screen:  Recent Labs  Lab 08/20/20 1245  LABOPIA NONE DETECTED  COCAINSCRNUR NONE DETECTED  LABBENZ NONE DETECTED  AMPHETMU NONE DETECTED  THCU NONE DETECTED  LABBARB NONE DETECTED    Alcohol Level  Recent Labs  Lab 08/20/20 1038  ETH <10    IMAGING past 24 hours MR ANGIO HEAD WO CONTRAST  Result Date: 08/20/2020 CLINICAL DATA:  Initial evaluation for neuro deficit, stroke suspected. EXAM: MRI HEAD WITHOUT CONTRAST MRA HEAD WITHOUT CONTRAST TECHNIQUE: Multiplanar, multi-echo pulse sequences of the brain and surrounding structures were acquired without intravenous contrast. Angiographic images  of the Circle of Willis were acquired using MRA technique without intravenous contrast. COMPARISON:  Prior CTs from earlier the same day. FINDINGS: MRI HEAD FINDINGS Brain: Examination moderately degraded by motion. Diffuse prominence of the CSF containing spaces compatible generalized cerebral atrophy. Patchy T2/FLAIR hyperintensity within periventricular deep white matter both cerebral hemispheres most consistent with chronic small vessel ischemic disease, mild for age. Remote lacunar infarct present at the posterior left corona radiata. Patchy small volume areas of restricted diffusion seen involving the cortical and subcortical aspect of the right parietal lobe (series 3, images 41, 36, 33, 31), consistent with acute ischemic infarcts, posterior  right MCA distribution. These are likely embolic in nature. No associated hemorrhage or mass effect. No other evidence for acute or subacute ischemia. Gray-white matter differentiation otherwise maintained. No other areas of remote cortical infarction. No evidence for acute or chronic intracranial hemorrhage. No mass lesion, midline shift or mass effect. No hydrocephalus or extra-axial fluid collection. Pituitary gland suprasellar region within normal limits. Midline structures intact. Vascular: Major intracranial vascular flow voids are maintained. Skull and upper cervical spine: Craniocervical junction within normal limits. Bone marrow signal intensity normal. Susceptibility artifact emanating from the right frontal scalp noted. Scalp soft tissues otherwise unremarkable. Sinuses/Orbits: Prior bilateral ocular lens replacement. Mild mucosal thickening noted within the maxillary sinuses. Paranasal sinuses are otherwise largely clear. No significant mastoid effusion. Other: None. MRA HEAD FINDINGS Anterior circulation: Examination mildly degraded by motion. Visualized distal cervical segments of the internal carotid arteries are patent with antegrade flow. Petrous and cavernous segments widely patent bilaterally. Mild to moderate narrowing at the supraclinoid left ICA related to atherosclerotic plaque noted (series 2, image 81). No significant narrowing on the right by MRA. A1 segments patent bilaterally. Normal anterior communicating artery complex. Anterior cerebral arteries patent to their distal aspects without stenosis. No M1 stenosis or occlusion. Normal MCA bifurcations. Distal MCA branches perfused and symmetric. Posterior circulation: Visualized V4 segments widely patent. Left PICA origin patent and normal. Right PICA not seen. Basilar patent to its distal aspect without stenosis. Superior cerebellar arteries patent bilaterally. Left PCA primarily supplied via the basilar. Right PCA supplied via a  hypoplastic right P1 segment and robust right posterior communicating artery. Both PCAs perfused to their distal aspects without stenosis. Anatomic variants: Predominant fetal type origin of the right PCA. No intracranial aneurysm. IMPRESSION: MRI HEAD IMPRESSION: 1. Patchy small volume acute ischemic nonhemorrhagic right parietal infarcts, posterior right MCA distribution. These are likely embolic in nature. 2. Underlying age-related cerebral atrophy with mild chronic small vessel ischemic disease. MRA HEAD IMPRESSION: 1. Negative intracranial MRA for large vessel occlusion. 2. Mild to moderate atherosclerotic stenosis involving the supraclinoid left ICA. No other hemodynamically significant or correctable stenosis. Electronically Signed   By: Jeannine Boga M.D.   On: 08/20/2020 18:47   MR BRAIN WO CONTRAST  Result Date: 08/20/2020 CLINICAL DATA:  Initial evaluation for neuro deficit, stroke suspected. EXAM: MRI HEAD WITHOUT CONTRAST MRA HEAD WITHOUT CONTRAST TECHNIQUE: Multiplanar, multi-echo pulse sequences of the brain and surrounding structures were acquired without intravenous contrast. Angiographic images of the Circle of Willis were acquired using MRA technique without intravenous contrast. COMPARISON:  Prior CTs from earlier the same day. FINDINGS: MRI HEAD FINDINGS Brain: Examination moderately degraded by motion. Diffuse prominence of the CSF containing spaces compatible generalized cerebral atrophy. Patchy T2/FLAIR hyperintensity within periventricular deep white matter both cerebral hemispheres most consistent with chronic small vessel ischemic disease, mild for age. Remote lacunar infarct present at the  posterior left corona radiata. Patchy small volume areas of restricted diffusion seen involving the cortical and subcortical aspect of the right parietal lobe (series 3, images 41, 36, 33, 31), consistent with acute ischemic infarcts, posterior right MCA distribution. These are likely embolic  in nature. No associated hemorrhage or mass effect. No other evidence for acute or subacute ischemia. Gray-white matter differentiation otherwise maintained. No other areas of remote cortical infarction. No evidence for acute or chronic intracranial hemorrhage. No mass lesion, midline shift or mass effect. No hydrocephalus or extra-axial fluid collection. Pituitary gland suprasellar region within normal limits. Midline structures intact. Vascular: Major intracranial vascular flow voids are maintained. Skull and upper cervical spine: Craniocervical junction within normal limits. Bone marrow signal intensity normal. Susceptibility artifact emanating from the right frontal scalp noted. Scalp soft tissues otherwise unremarkable. Sinuses/Orbits: Prior bilateral ocular lens replacement. Mild mucosal thickening noted within the maxillary sinuses. Paranasal sinuses are otherwise largely clear. No significant mastoid effusion. Other: None. MRA HEAD FINDINGS Anterior circulation: Examination mildly degraded by motion. Visualized distal cervical segments of the internal carotid arteries are patent with antegrade flow. Petrous and cavernous segments widely patent bilaterally. Mild to moderate narrowing at the supraclinoid left ICA related to atherosclerotic plaque noted (series 2, image 81). No significant narrowing on the right by MRA. A1 segments patent bilaterally. Normal anterior communicating artery complex. Anterior cerebral arteries patent to their distal aspects without stenosis. No M1 stenosis or occlusion. Normal MCA bifurcations. Distal MCA branches perfused and symmetric. Posterior circulation: Visualized V4 segments widely patent. Left PICA origin patent and normal. Right PICA not seen. Basilar patent to its distal aspect without stenosis. Superior cerebellar arteries patent bilaterally. Left PCA primarily supplied via the basilar. Right PCA supplied via a hypoplastic right P1 segment and robust right posterior  communicating artery. Both PCAs perfused to their distal aspects without stenosis. Anatomic variants: Predominant fetal type origin of the right PCA. No intracranial aneurysm. IMPRESSION: MRI HEAD IMPRESSION: 1. Patchy small volume acute ischemic nonhemorrhagic right parietal infarcts, posterior right MCA distribution. These are likely embolic in nature. 2. Underlying age-related cerebral atrophy with mild chronic small vessel ischemic disease. MRA HEAD IMPRESSION: 1. Negative intracranial MRA for large vessel occlusion. 2. Mild to moderate atherosclerotic stenosis involving the supraclinoid left ICA. No other hemodynamically significant or correctable stenosis. Electronically Signed   By: Jeannine Boga M.D.   On: 08/20/2020 18:47   CT HEAD CODE STROKE WO CONTRAST  Result Date: 08/20/2020 CLINICAL DATA:  Code stroke.  80 year old male. EXAM: CT HEAD WITHOUT CONTRAST TECHNIQUE: Contiguous axial images were obtained from the base of the skull through the vertex without intravenous contrast. COMPARISON:  Head CT 05/18/2017. FINDINGS: Study is mildly degraded by motion artifact despite repeated imaging attempts. Brain: Cerebral volume is stable since 2019. No midline shift, ventriculomegaly, mass effect, evidence of mass lesion, intracranial hemorrhage or evidence of cortically based acute infarction. Gray-white matter differentiation remains within normal limits for age. Minimal white matter hypodensity for age. No cortical encephalomalacia identified. Vascular: Calcified atherosclerosis at the skull base. No suspicious intracranial vascular hyperdensity. Skull: No acute osseous abnormality identified. Sinuses/Orbits: Visualized paranasal sinuses and mastoids are stable and well aerated. Other: Visualized orbits and scalp soft tissues are within normal limits. ASPECTS [facility] Stroke Program Early CT Score) Total score (0-10 with 10 being normal): 10 IMPRESSION: 1. Mildly degraded by motion with no acute  cortically based infarct or acute intracranial hemorrhage identified. ASPECTS 10. 2. These results were communicated to Dr. Cheral Marker at  9:15 am on 08/20/2020 by text page via the [facility] messaging system. Electronically Signed   By: Genevie Ann M.D.   On: 08/20/2020 09:16   CT ANGIO HEAD CODE STROKE  Result Date: 08/20/2020 CLINICAL DATA:  Code stroke.  80 year old male. EXAM: CT ANGIOGRAPHY HEAD AND NECK TECHNIQUE: Multidetector CT imaging of the head and neck was performed using the standard protocol during bolus administration of intravenous contrast. Multiplanar CT image reconstructions and MIPs were obtained to evaluate the vascular anatomy. Carotid stenosis measurements (when applicable) are obtained utilizing NASCET criteria, using the distal internal carotid diameter as the denominator. CONTRAST:  38m OMNIPAQUE IOHEXOL 350 MG/ML SOLN COMPARISON:  Plain head CT 0904 hours today. FINDINGS: CTA NECK Skeleton: Advanced lower cervical spine disc and endplate degeneration. Mild chronic appearing T3 superior endplate compression. No acute osseous abnormality identified. Upper chest: Mild generalized peribronchial thickening in the upper lobes. No superior mediastinal lymphadenopathy. Other neck: Negative. Aortic arch: 3 vessel arch configuration with minimal arch atherosclerosis. Right carotid system: Tortuous brachiocephalic artery without plaque or stenosis. Negative right CCA origin. Mild plaque at the right carotid bifurcation without stenosis. Mildly tortuous cervical right ICA to the skull base. Left carotid system: Mildly tortuous but otherwise negative proximal left CCA. Mild calcified plaque at the left ICA origin without stenosis. Mildly tortuous cervical left ICA. Vertebral arteries: Mild proximal right subclavian artery plaque without stenosis. Normal right vertebral artery origin. Patent right vertebral artery to the skull base without plaque or stenosis. Mild soft and calcified plaque of the proximal  left subclavian artery without stenosis. Soft plaque and mild stenosis at the left vertebral artery origin on series 8, image 176. Tortuous left V1 segment. Fairly codominant left vertebral artery is otherwise patent and normal to the skull base. CTA HEAD Posterior circulation: Patent codominant distal vertebral arteries to the vertebrobasilar junction without stenosis. Normal left PICA origin. Right PICA appears diminutive or absent. Patent basilar artery without stenosis. Patent SCA origins. Fetal type right PCA origin. Small left posterior communicating artery. Mild left P1 segment irregularity and stenosis. Otherwise bilateral PCA branches are within normal limits. Anterior circulation: Both ICA siphons are patent. Bilateral siphon calcified plaque. Mild to moderate supraclinoid ICA stenosis on the left (series 8, image 100). Normal left posterior communicating and ophthalmic artery origins. On the right moderate supraclinoid stenosis (series 9, image 95) due to calcified plaque situated between normal right ophthalmic and posterior communicating artery origins. Patent carotid termini, MCA and ACA origins. Normal anterior communicating artery. Bilateral ACA branches are within normal limits. Left MCA M1 segment is mildly tortuous. Left MCA bifurcation is patent without stenosis. Left MCA branches are within normal limits. Right MCA M1 segment is mildly tortuous and patent without stenosis. Patent right MCA bi or trifurcation without stenosis. Dominant posterior right MCA M2 branch (series 12, image 14). No right MCA branch stenosis or occlusion identified. Venous sinuses: Early contrast timing, grossly patent. Anatomic variants: Fetal type right PCA origin. Preliminary report of this exam discussed by telephone with Dr. EKerney Elbeon 08/20/2020 at 0923 hours. Review of the MIP images confirms the above findings IMPRESSION: 1. Negative for large vessel occlusion. 2. Mild atherosclerosis in the neck and no  significant extracranial stenosis. 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 These results were communicated to Dr. LCheral Markerat 9:27 am on 08/20/2020 by text page via the [facility] system. Electronically Signed   By: HGenevie AnnM.D.   On: 08/20/2020 09:29   CT  ANGIO NECK CODE STROKE  Result Date: 08/20/2020 CLINICAL DATA:  Code stroke.  80 year old male. EXAM: CT ANGIOGRAPHY HEAD AND NECK TECHNIQUE: Multidetector CT imaging of the head and neck was performed using the standard protocol during bolus administration of intravenous contrast. Multiplanar CT image reconstructions and MIPs were obtained to evaluate the vascular anatomy. Carotid stenosis measurements (when applicable) are obtained utilizing NASCET criteria, using the distal internal carotid diameter as the denominator. CONTRAST:  59m OMNIPAQUE IOHEXOL 350 MG/ML SOLN COMPARISON:  Plain head CT 0904 hours today. FINDINGS: CTA NECK Skeleton: Advanced lower cervical spine disc and endplate degeneration. Mild chronic appearing T3 superior endplate compression. No acute osseous abnormality identified. Upper chest: Mild generalized peribronchial thickening in the upper lobes. No superior mediastinal lymphadenopathy. Other neck: Negative. Aortic arch: 3 vessel arch configuration with minimal arch atherosclerosis. Right carotid system: Tortuous brachiocephalic artery without plaque or stenosis. Negative right CCA origin. Mild plaque at the right carotid bifurcation without stenosis. Mildly tortuous cervical right ICA to the skull base. Left carotid system: Mildly tortuous but otherwise negative proximal left CCA. Mild calcified plaque at the left ICA origin without stenosis. Mildly tortuous cervical left ICA. Vertebral arteries: Mild proximal right subclavian artery plaque without stenosis. Normal right vertebral artery origin. Patent right vertebral artery to the skull base without plaque or stenosis. Mild soft and calcified  plaque of the proximal left subclavian artery without stenosis. Soft plaque and mild stenosis at the left vertebral artery origin on series 8, image 176. Tortuous left V1 segment. Fairly codominant left vertebral artery is otherwise patent and normal to the skull base. CTA HEAD Posterior circulation: Patent codominant distal vertebral arteries to the vertebrobasilar junction without stenosis. Normal left PICA origin. Right PICA appears diminutive or absent. Patent basilar artery without stenosis. Patent SCA origins. Fetal type right PCA origin. Small left posterior communicating artery. Mild left P1 segment irregularity and stenosis. Otherwise bilateral PCA branches are within normal limits. Anterior circulation: Both ICA siphons are patent. Bilateral siphon calcified plaque. Mild to moderate supraclinoid ICA stenosis on the left (series 8, image 100). Normal left posterior communicating and ophthalmic artery origins. On the right moderate supraclinoid stenosis (series 9, image 95) due to calcified plaque situated between normal right ophthalmic and posterior communicating artery origins. Patent carotid termini, MCA and ACA origins. Normal anterior communicating artery. Bilateral ACA branches are within normal limits. Left MCA M1 segment is mildly tortuous. Left MCA bifurcation is patent without stenosis. Left MCA branches are within normal limits. Right MCA M1 segment is mildly tortuous and patent without stenosis. Patent right MCA bi or trifurcation without stenosis. Dominant posterior right MCA M2 branch (series 12, image 14). No right MCA branch stenosis or occlusion identified. Venous sinuses: Early contrast timing, grossly patent. Anatomic variants: Fetal type right PCA origin. Preliminary report of this exam discussed by telephone with Dr. EKerney Elbeon 08/20/2020 at 0923 hours. Review of the MIP images confirms the above findings IMPRESSION: 1. Negative for large vessel occlusion. 2. Mild atherosclerosis in  the neck and no significant extracranial stenosis. 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 These results were communicated to Dr. LCheral Markerat 9:27 am on 08/20/2020 by text page via the [facility]aging system. Electronically Signed   By: HGenevie AnnM.D.   On: 08/20/2020 09:29    PHYSICAL EXAM Physical Exam  Constitutional: Appears well-developed and well-nourished.  Psych: Affect appropriate to situation Eyes: Normal external eye and conjunctiva. HENT: Normocephalic, no lesions, without obvious abnormality.  Musculoskeletal-no joint tenderness, deformity or swelling Cardiovascular: Normal rate and regular rhythm.  Respiratory: Effort normal, non-labored breathing saturations WNL GI: Soft.  No distension. There is no tenderness.  Skin: WDI Neuro:  Mental Status: Alert, oriented, thought content appropriate.  Speech fluent without evidence of aphasia.  Able to follow commands without difficulty. Cranial Nerves: II:  Visual fields grossly normal,  III,IV, VI: ptosis not present, extra-ocular motions intact bilaterally pupils equal, round, reactive to light and accommodation V,VII: smile symmetric, facial light touch sensation normal bilaterally VIII: hearing normal bilaterally IX,X: uvula rises symmetrically XI: bilateral shoulder shrug XII: midline tongue extension Motor: Right : Upper extremity   5/5  Left:     Upper extremity   5/5  Lower extremity   5/5   Lower extremity   5/5 Tone and bulk:normal tone throughout; no atrophy noted Sensory:  light touch intact throughout, bilaterally Cerebellar: No ataxia Gait: ndeferred   ASSESSMENT/PLAN (Per Dr. Cheral Marker consult note 08/20/20) Lonnie Boyd is an 80 y.o. male with a PMHx of CAD, cardiac arrest with ventricular fibrillation, cardiomyopathy, dementia, dyslipidemia, GI bleed, NSTEMI, OSA, paroxysmal atrial fibrillation and ventral hernia presenting acutely from home via EMS for left sided weakness,  slurred speech and confusion. LKN was 0750. Family witnessed the onset of the weakness at 74. On awakening, the patient was normal per family. He spoke with family about having breakfast, then sat down and onset of the weakness was then noted. EMS was called and arrived on scene at 17. The patient was noted by EMS to have left facial droop and left sided weakness. En route, he developed left sided visual loss. He has no prior history of stroke. Home medications include Eliquis and Plavix.   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 Mildly degraded by motion with no acute cortically based infarct or acute intracranial hemorrhage identified. ASPECTS 10. CTA head & neck 1. Negative for large vessel occlusion. 2. Mild atherosclerosis in the neck and no significant extracranial stenosis. 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 MRI  1. Patchy small volume acute ischemic nonhemorrhagic right parietal infarcts, posterior right MCA distribution. These are likely embolic in nature. 2. Underlying age-related cerebral atrophy with mild chronic small vessel ischemic disease. MRA  1. Negative intracranial MRA for large vessel occlusion. 2. Mild to moderate atherosclerotic stenosis involving the supraclinoid left ICA. No other hemodynamically significant or correctable stenosis. 2D Echo pending LDL 126 HgbA1c : pending VTE prophylaxis - on eliquis clopidogrel 75 mg daily and Eliquis (apixaban) daily prior to admission, now on clopidogrel 75 mg daily and Eliquis (apixaban) daily.  Therapy recommendations:  none Disposition:  home  Chronic A. Fib Supposed to be on Eliquis PTA However patient not taking it regularly Now patient daughter-in-law while taking care of patient medications to ensure patient taking Eliquis Continue Eliquis on discharge  Hypertension Home meds:  cozaar,  metoprolol Stable Permissive hypertension (OK if < 220/120) but gradually normalize in 5-7 days Long-term BP goal normotensive  Hyperlipidemia Home meds:  atorvastatin 40 mg, increased in hospital LDL 126, goal < 70 Increase atorvastatin to 80 mg daily Diet counseling Continue statin at discharge  Diabetes type II (no diagnosis) Home meds:  none HgbA1c pending goal < 7.0  Other Stroke Risk Factors Advanced Age >/= 27  Coronary artery disease Obstructive sleep apnea,    Hospital day # 1  Laurey Morale, MSN, NP-C  [facility] [phone]   To contact Stroke Continuity provider, please refer to [email or web address] After hours, contact General Neurology

## 2020-08-21 NOTE — Discharge Instructions (Signed)
Monitor blood pressure and bring log to PCP

## 2020-08-21 NOTE — Progress Notes (Signed)
Discharge instruction given to the patient and his son and they stated understanding. Medication to take at home and follow up appointment explained and stated understanding. Questions and concerns answered. IV and tele monitor discontinued. Patient discharged via wheel chair at 0534pm accompanied by his son. Patient hemodynamically stable during discharge.

## 2020-08-21 NOTE — Evaluation (Signed)
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   TIA (transient ischemic attack) 08/20/2020   Ventral hernia    Hyperlipemia    CAD (coronary artery disease)    Dementia ([place]) 09/04/2019   OSA (obstructive sleep apnea) 05/04/2019   Permanent atrial fibrillation ([place]) 03/01/2019   Dizziness 06/23/2017   Falls 05/24/2017   Cardiomyopathy ([place]) 02/15/2017   Acute blood loss anemia    Gastrointestinal hemorrhage    Multiple closed fractures of ribs of both sides    PAF (paroxysmal atrial fibrillation) ([place]) 09/25/2016   Anemia-HEME positive    NSTEMI (non-ST elevated myocardial infarction) ([place]) 09/20/2016   Ischemic cardiomyopathy    CAD- prior PCIs- cath- moderate CAD 09/22/16    [place] point[facility])    Cardiac arrest with ventricular fibrillation ([place])    Dyslipidemia 05/15/2015   Past Medical History:  Past Medical History:  Diagnosis Date   Acute blood loss anemia    Anemia-HEME positive    CAD (coronary artery disease)    CAD- prior PCIs- cath- moderate CAD 09/22/16    Cardiac arrest with ventricular fibrillation ([place])    Cardiomyopathy ([place])    Dementia ([place]) 09/04/2019   Dizziness 06/23/2017   Dyslipidemia 05/15/2015   Falls 05/24/2017   Gastrointestinal hemorrhage    Hyperlipemia    Ischemic cardiomyopathy    Multiple closed fractures of ribs of both sides    NSTEMI (non-ST elevated myocardial infarction) ([place]) 09/20/2016   OSA (obstructive sleep apnea) 05/04/2019   PAF (paroxysmal atrial fibrillation) ([place]) 09/25/2016   Permanent atrial fibrillation ([place]) 03/01/2019   Torsades de pointes (HCC)    Ventral hernia    Past Surgical  History:  Past Surgical History:  Procedure Laterality Date   KIDNEY SURGERY     LEFT HEART CATH AND CORONARY ANGIOGRAPHY N/A 09/20/2016   Procedure: LEFT HEART CATH AND CORONARY ANGIOGRAPHY;  Surgeon: Belva Crome, MD;  Location: [facility];  Service: Cardiovascular;  Laterality: N/A;   HPI:  Pt is an 80 y.o. male presented with new onset of left-sided weakness, slurred speech and confusion. 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 fib on Eliquis, HTN, new onset of dementia 2021, HLD,   Assessment / Plan / Recommendation Clinical Impression  Pt participated in evaluation with his granddaughter present. Both parties denied any acute changes in cognition and stated that the pt's speech is back to or very close to baseline with some intermittent need for self-correction when producing more complex words. Pt stated that she has been diagnosed with Alzheimer's disease and that he has been having some difficulty with memory and medication management. Pt's family added that difficulty had also been noticed with financial management and that his family (with whom he lives) will be assisting with these areas after discharge. His speech and language skills are currently within normal limits. His cognitive-linguistic skills are representative of his reported baseline with some difficulty in the areas of attention, memory and executive functioning. Further skilled SLP services are not clinically indicated at this time. Pt, nursing, and his granddaughter were educated regarding  results and recommendations; all parties verbalized understanding as well as agreement with plan of care.    SLP Assessment  SLP Recommendation/Assessment: Patient does not need any further Speech Lanaguage Pathology Services SLP Visit Diagnosis: Cognitive communication deficit (R41.841)     Follow Up Recommendations  None    Frequency and Duration           SLP Evaluation Cognition  Overall Cognitive Status: History of cognitive impairments - at baseline Arousal/Alertness: Awake/alert Orientation Level: Oriented to person;Oriented to place;Oriented to situation (Date provided: Wednesday, August 15, 2020) Attention: Focused;Sustained Focused Attention: Appears intact Sustained Attention: Impaired Sustained Attention Impairment: Verbal complex Memory: Impaired Memory Impairment: Storage deficit;Retrieval deficit;Decreased recall of new information (Immediate: 3/3; delayed: 2/3) Awareness: Appears intact Problem Solving: Impaired Problem Solving Impairment: Verbal complex Executive Function: Reasoning Reasoning: Impaired Reasoning Impairment: Verbal complex       Comprehension  Auditory Comprehension Overall Auditory Comprehension: Appears within functional limits for tasks assessed Yes/No Questions: Within Functional Limits Commands: Within Functional Limits Conversation: Complex Interfering Components: Hearing    Expression Expression Primary Mode of Expression: Verbal Verbal Expression Overall Verbal Expression: Appears within functional limits for tasks assessed Initiation: No impairment Repetition: No impairment Naming: No impairment Pragmatics: No impairment   Oral / Motor  Oral Motor/Sensory Function Overall Oral Motor/Sensory Function: Within functional limits Motor Speech Overall Motor Speech: Appears within functional limits for tasks assessed Respiration: Within functional limits Phonation: Normal Resonance: Within functional limits Articulation: Within functional limitis Intelligibility: Intelligible Motor Speech Errors: Aware   Simi Briel I. Hardin Negus, [place], [place] Office number [phone] Pager [place] 08/21/2020, 9:44 AM

## 2020-08-21 NOTE — Evaluation (Signed)
Physical Therapy Evaluation and Discharge Patient Details Name: Lonnie Boyd MRN: [id number] DOB: [birth year] Today's Date: 08/21/2020   History of Present Illness  80 y.o. male presented with new onset of left-sided weakness, slurred speech and confusion. 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 fib on Eliquis, HTN, new onset of dementia 2021, HLD,  Clinical Impression   Patient evaluated by Physical Therapy with no further acute PT needs identified. PEr pt and per assessment, symptoms have resolved. Patient/family have no further questions.  PT is signing off. Thank you for this referral.     Follow Up Recommendations No PT follow up    Equipment Recommendations  None recommended by PT    Recommendations for Other Services OT consult     Precautions / Restrictions Precautions Precautions: Fall Precaution Comments: denies falls      Mobility  Bed Mobility Overal bed mobility: Independent                  Transfers Overall transfer level: Independent Equipment used: None                Ambulation/Gait Ambulation/Gait assistance: Counsellor (Feet): 180 Feet Assistive device: None Gait Pattern/deviations: Step-through pattern;Decreased stride length;Staggering left Gait velocity: decreased   General Gait Details: one stagger step to his left when turning to his right--pt recovered without assist  Stairs            Wheelchair Mobility    Modified Rankin (Stroke Patients Only) Modified Rankin (Stroke Patients Only) Pre-Morbid Rankin Score: Slight disability Modified Rankin: Slight disability     Balance Overall balance assessment: Mild deficits observed, not formally tested;Needs assistance Sitting-balance support: No upper extremity supported;Feet unsupported Sitting balance-Leahy Scale:  Good     Standing balance support: No upper extremity supported Standing balance-Leahy Scale: Good   Single Leg Stance - Right Leg: 3 Single Leg Stance - Left Leg: 2     Rhomberg - Eyes Opened: 30 [place]berg - Eyes Closed: 10 High level balance activites: Direction changes;Turns;Sudden stops;Head turns;Other (comment) High Level Balance Comments: reaching overhead--no loss of balance             Pertinent Vitals/Pain Pain Assessment: No/denies pain    Home Living Family/patient expects to be discharged to:: Private residence Living Arrangements: Children (son and dtr-in-law) Available Help at Discharge: Family;Available 24 hours/day Type of Home: House         Home Equipment: Kasandra Knudsen - single point      Prior Function Level of Independence: Needs assistance   Gait / Transfers Assistance Needed: independent with basic mobiltiy; uses a cane at times (especially on uneven ground)           Hand Dominance        Extremity/Trunk Assessment   Upper Extremity Assessment Upper Extremity Assessment: Defer to OT evaluation    Lower Extremity Assessment Lower Extremity Assessment: Overall WFL for tasks assessed    Cervical / Trunk Assessment Cervical / Trunk Assessment: Kyphotic  Communication   Communication: HOH  Cognition Arousal/Alertness: Awake/alert Behavior During Therapy: WFL for tasks assessed/performed Overall Cognitive Status: History of cognitive impairments - at baseline                                 General Comments: early Alzheimer's--he can tell you  this and that he has trouble remembering to take his medicines. He plans to have his children help him with his med      General Comments General comments (skin integrity, edema, etc.): Grandaughter present. Reports he appears at baseline to her    Exercises     Assessment/Plan    PT Assessment Patent does not need any further PT services  PT Problem List         PT  Treatment Interventions      PT Goals (Current goals can be found in the Care Plan section)  Acute Rehab PT Goals Patient Stated Goal: Go home PT Goal Formulation: All assessment and education complete, DC therapy    Frequency     Barriers to discharge        Co-evaluation               AM-PAC PT "6 Clicks" Mobility  Outcome Measure Help needed turning from your back to your side while in a flat bed without using bedrails?: None Help needed moving from lying on your back to sitting on the side of a flat bed without using bedrails?: None Help needed moving to and from a bed to a chair (including a wheelchair)?: None Help needed standing up from a chair using your arms (e.g., wheelchair or bedside chair)?: None Help needed to walk in hospital room?: A Little Help needed climbing 3-5 steps with a railing? : A Little 6 Click Score: 22    End of Session Equipment Utilized During Treatment: Gait belt Activity Tolerance: Patient tolerated treatment well Patient left: in bed;with call bell/phone within reach;with bed alarm set;with family/visitor present Nurse Communication: Mobility status;Other (comment) (no pt needs) PT Visit Diagnosis: Unsteadiness on feet (R26.81)    Time: [id number] PT Time Calculation (min) (ACUTE ONLY): 16 min   Charges:   PT Evaluation $PT Eval Low Complexity: 1 Low           Arby Barrette, PT Pager [phone]   Rexanne Mano 08/21/2020, 9:11 AM

## 2020-08-21 NOTE — Progress Notes (Signed)
  Echocardiogram 2D Echocardiogram has been performed.  Lonnie Boyd 08/21/2020, 10:41 AM

## 2020-08-21 NOTE — Discharge Summary (Signed)
Physician Discharge Summary  PHELPS EICHE [id number] DOB: 03-12-1940 DOA: 08/20/2020  PCP: [name], MD  Admit date: 08/20/2020 Discharge date: 08/21/2020  Admitted From: home Discharge disposition: home   Recommendations for Outpatient Follow-Up:   Needs to take medications as prescribed Family to get involved with daily medications Home health RN Outpatient cardiology follow up for repeat echo as EF down but patient is not symptomatic -- needs to take medications on a regular basis   Discharge Diagnosis:   Active Problems:   CVA (cerebral vascular accident) [facility])    Discharge Condition: Improved.  Diet recommendation: Low sodium, heart healthy  Wound care: None.  Code status: Full.   History of Present Illness:   GURJOT PEARDON is a 80 y.o. 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 fib on Eliquis, HTN, new onset of dementia 2021, HLD, presented with new onset of left-sided weakness, slurred speech and confusion.   Patient does not recall what happened this morning, most history provided by patient's son and daughter-in-law over the phone.  Son [name], who lives with the patient reported patient developed left-sided weakness and clenching of the left fist "unable to release" and left-sided facial droop as well as trouble talking this morning.  Symptoms lasted about 2 hours and resolved in the ED.  Patient denied any numbness weakness or abdominal limbs, no blurry vision or hearing changes.      Hospital Course by Problem:   80 year old male with history of CAD/non-STEMI on Plavix, cardiac arrest with V. fib, PAF on Eliquis, cardiomyopathy, dementia, HLD, OSA admitted for left-sided weakness, slurred speech, confusion and left facial droop.  CT no acute abnormality.  CTA head and neck showed bilateral ICA siphon moderate stenosis.  MRI showed scattered right MCA infarcts.  EF  30 to 35% down from 45 to 50% in 11/2019.  UDS negative.  LDL 126, A1c6.1  Creatinine 1.2.  -has not been compliant - resume home Eliquis and Plavix.  Increase Lipitor from 40 to 80 for HLD management.   Given decreased EF from 6+ months ago but asymptomatic, recommend outpatient follow-up with cardiology for further management.  Dementia -continue goals of care discussions   Medical Consultants:    Neurology   Discharge Exam:   Vitals:   08/21/20 0908 08/21/20 1235  BP: (!) 157/94 (!) 139/93  Pulse: 72 74  Resp: 18   Temp:  98.3 F (36.8 C)  SpO2: 100%    Vitals:   08/21/20 0059 08/21/20 0459 08/21/20 0908 08/21/20 1235  BP: (!) 139/98 (!) 145/89 (!) 157/94 (!) 139/93  Pulse: 75 71 72 74  Resp: '18 18 18   '$ Temp: 99.6 F (37.6 C) 98.4 F (36.9 C)  98.3 F (36.8 C)  TempSrc: Oral     SpO2: 96% 96% 100%   Weight:      Height:        General exam: Appears calm and comfortable.   The results of significant diagnostics from this hospitalization (including imaging, microbiology, ancillary and laboratory) are listed below for reference.     Procedures and Diagnostic Studies:   MR ANGIO HEAD WO CONTRAST  Result Date: 08/20/2020 CLINICAL DATA:  Initial evaluation for neuro deficit, stroke suspected. EXAM: MRI HEAD WITHOUT CONTRAST MRA HEAD WITHOUT CONTRAST TECHNIQUE: Multiplanar, multi-echo pulse sequences of the brain and surrounding structures were acquired without intravenous contrast. Angiographic images of the Circle of Willis were  acquired using MRA technique without intravenous contrast. COMPARISON:  Prior CTs from earlier the same day. FINDINGS: MRI HEAD FINDINGS Brain: Examination moderately degraded by motion. Diffuse prominence of the CSF containing spaces compatible generalized cerebral atrophy. Patchy T2/FLAIR hyperintensity within periventricular deep white matter both cerebral hemispheres most consistent with chronic small vessel ischemic disease, mild for age.  Remote lacunar infarct present at the posterior left corona radiata. Patchy small volume areas of restricted diffusion seen involving the cortical and subcortical aspect of the right parietal lobe (series 3, images 41, 36, 33, 31), consistent with acute ischemic infarcts, posterior right MCA distribution. These are likely embolic in nature. No associated hemorrhage or mass effect. No other evidence for acute or subacute ischemia. Gray-white matter differentiation otherwise maintained. No other areas of remote cortical infarction. No evidence for acute or chronic intracranial hemorrhage. No mass lesion, midline shift or mass effect. No hydrocephalus or extra-axial fluid collection. Pituitary gland suprasellar region within normal limits. Midline structures intact. Vascular: Major intracranial vascular flow voids are maintained. Skull and upper cervical spine: Craniocervical junction within normal limits. Bone marrow signal intensity normal. Susceptibility artifact emanating from the right frontal scalp noted. Scalp soft tissues otherwise unremarkable. Sinuses/Orbits: Prior bilateral ocular lens replacement. Mild mucosal thickening noted within the maxillary sinuses. Paranasal sinuses are otherwise largely clear. No significant mastoid effusion. Other: None. MRA HEAD FINDINGS Anterior circulation: Examination mildly degraded by motion. Visualized distal cervical segments of the internal carotid arteries are patent with antegrade flow. Petrous and cavernous segments widely patent bilaterally. Mild to moderate narrowing at the supraclinoid left ICA related to atherosclerotic plaque noted (series 2, image 81). No significant narrowing on the right by MRA. A1 segments patent bilaterally. Normal anterior communicating artery complex. Anterior cerebral arteries patent to their distal aspects without stenosis. No M1 stenosis or occlusion. Normal MCA bifurcations. Distal MCA branches perfused and symmetric. Posterior  circulation: Visualized V4 segments widely patent. Left PICA origin patent and normal. Right PICA not seen. Basilar patent to its distal aspect without stenosis. Superior cerebellar arteries patent bilaterally. Left PCA primarily supplied via the basilar. Right PCA supplied via a hypoplastic right P1 segment and robust right posterior communicating artery. Both PCAs perfused to their distal aspects without stenosis. Anatomic variants: Predominant fetal type origin of the right PCA. No intracranial aneurysm. IMPRESSION: MRI HEAD IMPRESSION: 1. Patchy small volume acute ischemic nonhemorrhagic right parietal infarcts, posterior right MCA distribution. These are likely embolic in nature. 2. Underlying age-related cerebral atrophy with mild chronic small vessel ischemic disease. MRA HEAD IMPRESSION: 1. Negative intracranial MRA for large vessel occlusion. 2. Mild to moderate atherosclerotic stenosis involving the supraclinoid left ICA. No other hemodynamically significant or correctable stenosis. Electronically Signed   By: Jeannine Boga M.D.   On: 08/20/2020 18:47   MR BRAIN WO CONTRAST  Result Date: 08/20/2020 CLINICAL DATA:  Initial evaluation for neuro deficit, stroke suspected. EXAM: MRI HEAD WITHOUT CONTRAST MRA HEAD WITHOUT CONTRAST TECHNIQUE: Multiplanar, multi-echo pulse sequences of the brain and surrounding structures were acquired without intravenous contrast. Angiographic images of the Circle of Willis were acquired using MRA technique without intravenous contrast. COMPARISON:  Prior CTs from earlier the same day. FINDINGS: MRI HEAD FINDINGS Brain: Examination moderately degraded by motion. Diffuse prominence of the CSF containing spaces compatible generalized cerebral atrophy. Patchy T2/FLAIR hyperintensity within periventricular deep white matter both cerebral hemispheres most consistent with chronic small vessel ischemic disease, mild for age. Remote lacunar infarct present at the posterior  left corona radiata. Patchy  small volume areas of restricted diffusion seen involving the cortical and subcortical aspect of the right parietal lobe (series 3, images 41, 36, 33, 31), consistent with acute ischemic infarcts, posterior right MCA distribution. These are likely embolic in nature. No associated hemorrhage or mass effect. No other evidence for acute or subacute ischemia. Gray-white matter differentiation otherwise maintained. No other areas of remote cortical infarction. No evidence for acute or chronic intracranial hemorrhage. No mass lesion, midline shift or mass effect. No hydrocephalus or extra-axial fluid collection. Pituitary gland suprasellar region within normal limits. Midline structures intact. Vascular: Major intracranial vascular flow voids are maintained. Skull and upper cervical spine: Craniocervical junction within normal limits. Bone marrow signal intensity normal. Susceptibility artifact emanating from the right frontal scalp noted. Scalp soft tissues otherwise unremarkable. Sinuses/Orbits: Prior bilateral ocular lens replacement. Mild mucosal thickening noted within the maxillary sinuses. Paranasal sinuses are otherwise largely clear. No significant mastoid effusion. Other: None. MRA HEAD FINDINGS Anterior circulation: Examination mildly degraded by motion. Visualized distal cervical segments of the internal carotid arteries are patent with antegrade flow. Petrous and cavernous segments widely patent bilaterally. Mild to moderate narrowing at the supraclinoid left ICA related to atherosclerotic plaque noted (series 2, image 81). No significant narrowing on the right by MRA. A1 segments patent bilaterally. Normal anterior communicating artery complex. Anterior cerebral arteries patent to their distal aspects without stenosis. No M1 stenosis or occlusion. Normal MCA bifurcations. Distal MCA branches perfused and symmetric. Posterior circulation: Visualized V4 segments widely patent. Left  PICA origin patent and normal. Right PICA not seen. Basilar patent to its distal aspect without stenosis. Superior cerebellar arteries patent bilaterally. Left PCA primarily supplied via the basilar. Right PCA supplied via a hypoplastic right P1 segment and robust right posterior communicating artery. Both PCAs perfused to their distal aspects without stenosis. Anatomic variants: Predominant fetal type origin of the right PCA. No intracranial aneurysm. IMPRESSION: MRI HEAD IMPRESSION: 1. Patchy small volume acute ischemic nonhemorrhagic right parietal infarcts, posterior right MCA distribution. These are likely embolic in nature. 2. Underlying age-related cerebral atrophy with mild chronic small vessel ischemic disease. MRA HEAD IMPRESSION: 1. Negative intracranial MRA for large vessel occlusion. 2. Mild to moderate atherosclerotic stenosis involving the supraclinoid left ICA. No other hemodynamically significant or correctable stenosis. Electronically Signed   By: Jeannine Boga M.D.   On: 08/20/2020 18:47   ECHOCARDIOGRAM COMPLETE  Result Date: 08/21/2020    ECHOCARDIOGRAM REPORT   Patient Name:   CAMDAN FLORESCA Date of Exam: 08/21/2020 Medical Rec #:  [id number]       Height:       68.0 in Accession #:    [id number]      Weight:       155.6 lb Date of Birth:  1941/01/09        BSA:          1.837 m Patient Age:    58 years        BP:           157/94 mmHg Patient Gender: M               HR:           49 bpm. Exam Location:  Inpatient Procedure: 2D Echo, Cardiac Doppler, Color Doppler and 3D Echo Indications:    TIA  History:        Patient has prior history of Echocardiogram examinations, most  recent 12/05/2019. Previous Myocardial Infarction,                 Arrythmias:Atrial Fibrillation; Risk Factors:Dyslipidemia and                 Sleep Apnea. Ischemic cardiomyopathy. Hx PCI.  Sonographer:    Clayton Lefort RDCS (AE) Referring Phys: [id number] [place]  1. Severe hypokinesis  of the posterior wall. The mid and distal inferior segments are severely hypokinetic as well. Left ventricular ejection fraction, by estimation, is 30 to 35%. Left ventricular ejection fraction by 3D volume is 32 %. The left ventricle has moderately decreased function. The left ventricle demonstrates global hypokinesis. There is moderate concentric left ventricular hypertrophy. Left ventricular diastolic function could not be evaluated.  2. Right ventricular systolic function is normal. The right ventricular size is normal. There is normal pulmonary artery systolic pressure. The estimated right ventricular systolic pressure is 123456 mmHg.  3. The mitral valve is grossly normal. Mild mitral valve regurgitation. No evidence of mitral stenosis.  4. The aortic valve is tricuspid. Aortic valve regurgitation is mild. No aortic stenosis is present.  5. The inferior vena cava is normal in size with greater than 50% respiratory variability, suggesting right atrial pressure of 3 mmHg. Comparison(s): Changes from prior study are noted. EF is now 30-35%. WMAs noted above. FINDINGS  Left Ventricle: Severe hypokinesis of the posterior wall. The mid and distal inferior segments are severely hypokinetic as well. Left ventricular ejection fraction, by estimation, is 30 to 35%. Left ventricular ejection fraction by 3D volume is 32 %. The left ventricle has moderately decreased function. The left ventricle demonstrates global hypokinesis. The left ventricular internal cavity size was normal in size. There is moderate concentric left ventricular hypertrophy. Left ventricular diastolic function could not be evaluated due to atrial fibrillation. Left ventricular diastolic function could not be evaluated.  LV Wall Scoring: The mid and distal inferior wall and posterior wall are hypokinetic. Right Ventricle: The right ventricular size is normal. No increase in right ventricular wall thickness. Right ventricular systolic function is normal.  There is normal pulmonary artery systolic pressure. The tricuspid regurgitant velocity is 1.92 m/s, and  with an assumed right atrial pressure of 3 mmHg, the estimated right ventricular systolic pressure is 123456 mmHg. Left Atrium: Left atrial size was normal in size. Right Atrium: Right atrial size was normal in size. Pericardium: There is no evidence of pericardial effusion. Mitral Valve: The mitral valve is grossly normal. Mild mitral valve regurgitation. No evidence of mitral valve stenosis. Tricuspid Valve: The tricuspid valve is grossly normal. Tricuspid valve regurgitation is mild . No evidence of tricuspid stenosis. Aortic Valve: The aortic valve is tricuspid. Aortic valve regurgitation is mild. Aortic regurgitation PHT measures 338 msec. No aortic stenosis is present. Aortic valve mean gradient measures 2.0 mmHg. Aortic valve peak gradient measures 4.1 mmHg. Aortic  valve area, by VTI measures 1.60 cm. Pulmonic Valve: The pulmonic valve was grossly normal. Pulmonic valve regurgitation is not visualized. No evidence of pulmonic stenosis. Aorta: The aortic root and ascending aorta are structurally normal, with no evidence of dilitation. Venous: The inferior vena cava is normal in size with greater than 50% respiratory variability, suggesting right atrial pressure of 3 mmHg. IAS/Shunts: The atrial septum is grossly normal.  LEFT VENTRICLE PLAX 2D LVIDd:         5.40 cm LVIDs:         4.00 cm LV PW:  1.30 cm         3D Volume EF LV IVS:        1.33 cm         LV 3D EF:    Left LVOT diam:     2.20 cm                      ventricular LV SV:         36                           ejection LV SV Index:   19                           fraction by LVOT Area:     3.80 cm                     3D volume                                             is 32 %.  LV Volumes (MOD) LV vol d, MOD    146.0 ml      3D Volume EF: A2C:                           3D EF:        32 % LV vol d, MOD    117.0 ml      LV EDV:       135  ml A4C:                           LV ESV:       93 ml LV vol s, MOD    115.0 ml      LV SV:        43 ml A2C: LV vol s, MOD    67.2 ml A4C: LV SV MOD A2C:   31.0 ml LV SV MOD A4C:   117.0 ml LV SV MOD BP:    45.1 ml RIGHT VENTRICLE             IVC RV S prime:     14.70 cm/s  IVC diam: 2.00 cm TAPSE (M-mode): 1.9 cm LEFT ATRIUM             Index       RIGHT ATRIUM           Index LA diam:        3.60 cm 1.96 cm/m  RA Area:     20.60 cm LA Vol (A2C):   70.9 ml 38.59 ml/m RA Volume:   56.90 ml  30.97 ml/m LA Vol (A4C):   36.5 ml 19.87 ml/m LA Biplane Vol: 55.8 ml 30.37 ml/m  AORTIC VALVE AV Area (Vmax):    1.92 cm AV Area (Vmean):   1.83 cm AV Area (VTI):     1.60 cm AV Vmax:           100.67 cm/s AV Vmean:          71.100 cm/s AV VTI:            0.224 m AV Peak Grad:  4.1 mmHg AV Mean Grad:      2.0 mmHg LVOT Vmax:         50.93 cm/s LVOT Vmean:        34.200 cm/s LVOT VTI:          0.094 m LVOT/AV VTI ratio: 0.42 AI PHT:            338 msec  AORTA Ao Root diam: 3.40 cm Ao Asc diam:  3.20 cm TRICUSPID VALVE TR Peak grad:   14.7 mmHg TR Vmax:        192.00 cm/s  SHUNTS Systemic VTI:  0.09 m Systemic Diam: 2.20 cm Eleonore Chiquito MD Electronically signed by Eleonore Chiquito MD Signature Date/Time: 08/21/2020/11:24:50 AM    Final    CT HEAD CODE STROKE WO CONTRAST  Result Date: 08/20/2020 CLINICAL DATA:  Code stroke.  80 year old male. EXAM: CT HEAD WITHOUT CONTRAST TECHNIQUE: Contiguous axial images were obtained from the base of the skull through the vertex without intravenous contrast. COMPARISON:  Head CT 05/18/2017. FINDINGS: Study is mildly degraded by motion artifact despite repeated imaging attempts. Brain: Cerebral volume is stable since 2019. No midline shift, ventriculomegaly, mass effect, evidence of mass lesion, intracranial hemorrhage or evidence of cortically based acute infarction. Gray-white matter differentiation remains within normal limits for age. Minimal white matter hypodensity for age.  No cortical encephalomalacia identified. Vascular: Calcified atherosclerosis at the skull base. No suspicious intracranial vascular hyperdensity. Skull: No acute osseous abnormality identified. Sinuses/Orbits: Visualized paranasal sinuses and mastoids are stable and well aerated. Other: Visualized orbits and scalp soft tissues are within normal limits. ASPECTS [facility] Stroke Program Early CT Score) Total score (0-10 with 10 being normal): 10 IMPRESSION: 1. Mildly degraded by motion with no acute cortically based infarct or acute intracranial hemorrhage identified. ASPECTS 10. 2. These results were communicated to Dr. Cheral Marker at 9:15 am on 08/20/2020 by text page via the [facility] messaging system. Electronically Signed   By: Genevie Ann M.D.   On: 08/20/2020 09:16   CT ANGIO HEAD CODE STROKE  Result Date: 08/20/2020 CLINICAL DATA:  Code stroke.  80 year old male. EXAM: CT ANGIOGRAPHY HEAD AND NECK TECHNIQUE: Multidetector CT imaging of the head and neck was performed using the standard protocol during bolus administration of intravenous contrast. Multiplanar CT image reconstructions and MIPs were obtained to evaluate the vascular anatomy. Carotid stenosis measurements (when applicable) are obtained utilizing NASCET criteria, using the distal internal carotid diameter as the denominator. CONTRAST:  79m OMNIPAQUE IOHEXOL 350 MG/ML SOLN COMPARISON:  Plain head CT 0904 hours today. FINDINGS: CTA NECK Skeleton: Advanced lower cervical spine disc and endplate degeneration. Mild chronic appearing T3 superior endplate compression. No acute osseous abnormality identified. Upper chest: Mild generalized peribronchial thickening in the upper lobes. No superior mediastinal lymphadenopathy. Other neck: Negative. Aortic arch: 3 vessel arch configuration with minimal arch atherosclerosis. Right carotid system: Tortuous brachiocephalic artery without plaque or stenosis. Negative right CCA origin. Mild plaque at the right carotid  bifurcation without stenosis. Mildly tortuous cervical right ICA to the skull base. Left carotid system: Mildly tortuous but otherwise negative proximal left CCA. Mild calcified plaque at the left ICA origin without stenosis. Mildly tortuous cervical left ICA. Vertebral arteries: Mild proximal right subclavian artery plaque without stenosis. Normal right vertebral artery origin. Patent right vertebral artery to the skull base without plaque or stenosis. Mild soft and calcified plaque of the proximal left subclavian artery without stenosis. Soft plaque and mild stenosis at the left vertebral artery origin on  series 8, image 176. Tortuous left V1 segment. Fairly codominant left vertebral artery is otherwise patent and normal to the skull base. CTA HEAD Posterior circulation: Patent codominant distal vertebral arteries to the vertebrobasilar junction without stenosis. Normal left PICA origin. Right PICA appears diminutive or absent. Patent basilar artery without stenosis. Patent SCA origins. Fetal type right PCA origin. Small left posterior communicating artery. Mild left P1 segment irregularity and stenosis. Otherwise bilateral PCA branches are within normal limits. Anterior circulation: Both ICA siphons are patent. Bilateral siphon calcified plaque. Mild to moderate supraclinoid ICA stenosis on the left (series 8, image 100). Normal left posterior communicating and ophthalmic artery origins. On the right moderate supraclinoid stenosis (series 9, image 95) due to calcified plaque situated between normal right ophthalmic and posterior communicating artery origins. Patent carotid termini, MCA and ACA origins. Normal anterior communicating artery. Bilateral ACA branches are within normal limits. Left MCA M1 segment is mildly tortuous. Left MCA bifurcation is patent without stenosis. Left MCA branches are within normal limits. Right MCA M1 segment is mildly tortuous and patent without stenosis. Patent right MCA bi or  trifurcation without stenosis. Dominant posterior right MCA M2 branch (series 12, image 14). No right MCA branch stenosis or occlusion identified. Venous sinuses: Early contrast timing, grossly patent. Anatomic variants: Fetal type right PCA origin. Preliminary report of this exam discussed by telephone with Dr. Kerney Elbe on 08/20/2020 at 0923 hours. Review of the MIP images confirms the above findings IMPRESSION: 1. Negative for large vessel occlusion. 2. Mild atherosclerosis in the neck and no significant extracranial stenosis. 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 These results were communicated to Dr. Cheral Marker at 9:27 am on 08/20/2020 by text page via the [facility] messaging system. Electronically Signed   By: Genevie Ann M.D.   On: 08/20/2020 09:29   CT ANGIO NECK CODE STROKE  Result Date: 08/20/2020 CLINICAL DATA:  Code stroke.  80 year old male. EXAM: CT ANGIOGRAPHY HEAD AND NECK TECHNIQUE: Multidetector CT imaging of the head and neck was performed using the standard protocol during bolus administration of intravenous contrast. Multiplanar CT image reconstructions and MIPs were obtained to evaluate the vascular anatomy. Carotid stenosis measurements (when applicable) are obtained utilizing NASCET criteria, using the distal internal carotid diameter as the denominator. CONTRAST:  38m OMNIPAQUE IOHEXOL 350 MG/ML SOLN COMPARISON:  Plain head CT 0904 hours today. FINDINGS: CTA NECK Skeleton: Advanced lower cervical spine disc and endplate degeneration. Mild chronic appearing T3 superior endplate compression. No acute osseous abnormality identified. Upper chest: Mild generalized peribronchial thickening in the upper lobes. No superior mediastinal lymphadenopathy. Other neck: Negative. Aortic arch: 3 vessel arch configuration with minimal arch atherosclerosis. Right carotid system: Tortuous brachiocephalic artery without plaque or stenosis. Negative right CCA origin. Mild plaque at  the right carotid bifurcation without stenosis. Mildly tortuous cervical right ICA to the skull base. Left carotid system: Mildly tortuous but otherwise negative proximal left CCA. Mild calcified plaque at the left ICA origin without stenosis. Mildly tortuous cervical left ICA. Vertebral arteries: Mild proximal right subclavian artery plaque without stenosis. Normal right vertebral artery origin. Patent right vertebral artery to the skull base without plaque or stenosis. Mild soft and calcified plaque of the proximal left subclavian artery without stenosis. Soft plaque and mild stenosis at the left vertebral artery origin on series 8, image 176. Tortuous left V1 segment. Fairly codominant left vertebral artery is otherwise patent and normal to the skull base. CTA HEAD Posterior circulation: Patent codominant distal  vertebral arteries to the vertebrobasilar junction without stenosis. Normal left PICA origin. Right PICA appears diminutive or absent. Patent basilar artery without stenosis. Patent SCA origins. Fetal type right PCA origin. Small left posterior communicating artery. Mild left P1 segment irregularity and stenosis. Otherwise bilateral PCA branches are within normal limits. Anterior circulation: Both ICA siphons are patent. Bilateral siphon calcified plaque. Mild to moderate supraclinoid ICA stenosis on the left (series 8, image 100). Normal left posterior communicating and ophthalmic artery origins. On the right moderate supraclinoid stenosis (series 9, image 95) due to calcified plaque situated between normal right ophthalmic and posterior communicating artery origins. Patent carotid termini, MCA and ACA origins. Normal anterior communicating artery. Bilateral ACA branches are within normal limits. Left MCA M1 segment is mildly tortuous. Left MCA bifurcation is patent without stenosis. Left MCA branches are within normal limits. Right MCA M1 segment is mildly tortuous and patent without stenosis. Patent  right MCA bi or trifurcation without stenosis. Dominant posterior right MCA M2 branch (series 12, image 14). No right MCA branch stenosis or occlusion identified. Venous sinuses: Early contrast timing, grossly patent. Anatomic variants: Fetal type right PCA origin. Preliminary report of this exam discussed by telephone with Dr. Kerney Elbe on 08/20/2020 at 0923 hours. Review of the MIP images confirms the above findings IMPRESSION: 1. Negative for large vessel occlusion. 2. Mild atherosclerosis in the neck and no significant extracranial stenosis. 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 These results were communicated to Dr. Cheral Marker at 9:27 am on 08/20/2020 by text page via the [facility] messaging system. Electronically Signed   By: Genevie Ann M.D.   On: 08/20/2020 09:29     Labs:   Basic Metabolic Panel: Recent Labs  Lab 08/20/20 0858 08/20/20 0901 08/21/20 0200  NA 138 141 136  K 4.4 4.2 4.1  CL 108 108 107  CO2 22  --  23  GLUCOSE 96 97 92  BUN '19 23 19  '$ CREATININE 1.40* 1.20 1.43*  CALCIUM 8.5*  --  8.4*   GFR Estimated Creatinine Clearance: 38.5 mL/min (A) (by C-G formula based on SCr of 1.43 mg/dL (H)). Liver Function Tests: Recent Labs  Lab 08/20/20 0858  AST 28  ALT 13  ALKPHOS 85  BILITOT 0.8  PROT 6.4*  ALBUMIN 3.7   No results for input(s): LIPASE, AMYLASE in the last 168 hours. No results for input(s): AMMONIA in the last 168 hours. Coagulation profile Recent Labs  Lab 08/20/20 1038  INR 1.1    CBC: Recent Labs  Lab 08/20/20 0858 08/20/20 0901  WBC 8.8  --   NEUTROABS 4.1  --   HGB 16.3 16.3  HCT 49.2 48.0  MCV 99.0  --   PLT PLATELET CLUMPS NOTED ON SMEAR, UNABLE TO ESTIMATE  --    Cardiac Enzymes: No results for input(s): CKTOTAL, CKMB, CKMBINDEX, TROPONINI in the last 168 hours. BNP: Invalid input(s): POCBNP CBG: Recent Labs  Lab 08/20/20 0854  GLUCAP 94   D-Dimer No results for input(s): DDIMER in the last 72  hours. Hgb A1c No results for input(s): HGBA1C in the last 72 hours. Lipid Profile Recent Labs    08/21/20 0200  CHOL 184  HDL 30*  LDLCALC 126*  TRIG 140  CHOLHDL 6.1   Thyroid function studies No results for input(s): TSH, T4TOTAL, T3FREE, THYROIDAB in the last 72 hours.  Invalid input(s): FREET3 Anemia work up No results for input(s): VITAMINB12, FOLATE, FERRITIN, TIBC, IRON, RETICCTPCT in the last  72 hours. Microbiology Recent Results (from the past 240 hour(s))  Resp Panel by RT-PCR (Flu A&B, Covid) Nasopharyngeal Swab     Status: None   Collection Time: 08/20/20  8:57 AM   Specimen: Nasopharyngeal Swab; Nasopharyngeal(NP) swabs in vial transport medium  Result Value Ref Range Status   SARS Coronavirus 2 by RT PCR NEGATIVE NEGATIVE Final    Comment: (NOTE) SARS-CoV-2 target nucleic acids are NOT DETECTED.  The SARS-CoV-2 RNA is generally detectable in upper respiratory specimens during the acute phase of infection. The lowest concentration of SARS-CoV-2 viral copies this assay can detect is 138 copies/mL. A negative result does not preclude SARS-Cov-2 infection and should not be used as the sole basis for treatment or other patient management decisions. A negative result may occur with  improper specimen collection/handling, submission of specimen other than nasopharyngeal swab, presence of viral mutation(s) within the areas targeted by this assay, and inadequate number of viral copies(<138 copies/mL). A negative result must be combined with clinical observations, patient history, and epidemiological information. The expected result is Negative.  Fact Sheet for Patients:  EntrepreneurPulse.com.au  Fact Sheet for Healthcare Providers:  IncredibleEmployment.be  This test is no t yet approved or cleared by the [place] FDA and  has been authorized for detection and/or diagnosis of SARS-CoV-2 by FDA under an Emergency Use  Authorization (EUA). This EUA will remain  in effect (meaning this test can be used) for the duration of the COVID-19 declaration under Section 564(b)(1) of the Act, 21 U.S.C.section 360bbb-3(b)(1), unless the authorization is terminated  or revoked sooner.       Influenza A by PCR NEGATIVE NEGATIVE Final   Influenza B by PCR NEGATIVE NEGATIVE Final    Comment: (NOTE) The Xpert Xpress SARS-CoV-2/FLU/RSV plus assay is intended as an aid in the diagnosis of influenza from Nasopharyngeal swab specimens and should not be used as a sole basis for treatment. Nasal washings and aspirates are unacceptable for Xpert Xpress SARS-CoV-2/FLU/RSV testing.  Fact Sheet for Patients: EntrepreneurPulse.com.au  Fact Sheet for Healthcare Providers: IncredibleEmployment.be  This test is not yet approved or cleared by the [place] FDA and has been authorized for detection and/or diagnosis of SARS-CoV-2 by FDA under an Emergency Use Authorization (EUA). This EUA will remain in effect (meaning this test can be used) for the duration of the COVID-19 declaration under Section 564(b)(1) of the Act, 21 U.S.C. section 360bbb-3(b)(1), unless the authorization is terminated or revoked.  Performed at [facility], [place] [address], [place], [place] [id number]      Discharge Instructions:   Discharge Instructions     Ambulatory referral to Neurology   Complete by: As directed    An appointment is requested in approximately: 6 weeks   Diet - low sodium heart healthy   Complete by: As directed    Increase activity slowly   Complete by: As directed       Allergies as of 08/21/2020   No Known Allergies      Medication List     STOP taking these medications    escitalopram 10 MG tablet Commonly known as: LEXAPRO   esomeprazole 20 MG capsule Commonly known as: NEXIUM   nitroGLYCERIN 0.4 MG SL tablet Commonly known as: NITROSTAT       TAKE  these medications    albuterol (2.5 MG/3ML) 0.083% nebulizer solution Commonly known as: PROVENTIL Take 2.5 mg by nebulization every 4 (four) hours as needed for shortness of breath.   ALPRAZolam  0.25 MG tablet Commonly known as: XANAX Take 0.25 mg by mouth 2 (two) times daily as needed. for anxiety   AMBIEN PO Take by mouth.   atorvastatin 40 MG tablet Commonly known as: LIPITOR Take 2 tablets (80 mg total) by mouth every evening.   clopidogrel 75 MG tablet Commonly known as: PLAVIX Take 75 mg by mouth daily.   donepezil 5 MG tablet Commonly known as: ARICEPT Take 5 mg by mouth at bedtime.   Eliquis 5 MG Tabs tablet Generic drug: apixaban TAKE ONE TABLET BY MOUTH TWICE DAILY What changed: how much to take   losartan 50 MG tablet Commonly known as: COZAAR TAKE ONE TABLET BY MOUTH DAILY   memantine 10 MG tablet Commonly known as: NAMENDA Take 10 mg by mouth 2 (two) times daily.   metoprolol succinate 25 MG 24 hr tablet Commonly known as: TOPROL-XL Take 1 tablet (25 mg total) by mouth daily. Take with or immediately following a meal.   PRESCRIPTION MEDICATION Inhale 1-2 puffs into the lungs daily. Atrovent   PROTONIX PO Take by mouth.   VITAMIN B-COMPLEX PO Take 1 tablet by mouth daily.   VITAMIN D3 PO Take 1 tablet by mouth daily.        Follow-up Information     [name], MD Follow up.   Specialty: Internal Medicine Contact information: [address] [place] [address] [phone]         Park Liter, MD Follow up in 1 week(s).   Specialty: Cardiology Why: decreased EF Contact information: [address] [place] [address] [phone]                  Time coordinating discharge: 35 min  Signed:  Geradine Girt DO  [facility] 08/21/2020, 3:30 PM

## 2020-08-21 NOTE — ED Notes (Signed)
Tried to call report to floor no answer

## 2020-08-21 NOTE — ED Notes (Signed)
Report given to [facility] RN 2W

## 2020-08-22 ENCOUNTER — Other Ambulatory Visit: Payer: Self-pay | Admitting: Cardiology

## 2020-08-22 ENCOUNTER — Other Ambulatory Visit: Payer: Self-pay

## 2020-08-22 LAB — HEMOGLOBIN A1C
Hgb A1c MFr Bld: 6.1 % — ABNORMAL HIGH (ref 4.8–5.6)
Mean Plasma Glucose: 128 mg/dL

## 2020-08-22 MED ORDER — CLOPIDOGREL BISULFATE 75 MG PO TABS: 75.0000 mg | ORAL_TABLET | Freq: Every day | ORAL | 1 refills | Status: AC

## 2020-08-22 NOTE — Telephone Encounter (Signed)
Refill of Plavix 75 mg sent to [facility].

## 2020-08-24 DIAGNOSIS — I2510 Atherosclerotic heart disease of native coronary artery without angina pectoris: Secondary | ICD-10-CM | POA: Diagnosis not present

## 2020-08-24 DIAGNOSIS — F0390 Unspecified dementia without behavioral disturbance: Secondary | ICD-10-CM | POA: Diagnosis not present

## 2020-08-24 DIAGNOSIS — I482 Chronic atrial fibrillation, unspecified: Secondary | ICD-10-CM | POA: Diagnosis not present

## 2020-08-24 DIAGNOSIS — I255 Ischemic cardiomyopathy: Secondary | ICD-10-CM | POA: Diagnosis not present

## 2020-08-24 DIAGNOSIS — Z7901 Long term (current) use of anticoagulants: Secondary | ICD-10-CM | POA: Diagnosis not present

## 2020-08-24 DIAGNOSIS — G4733 Obstructive sleep apnea (adult) (pediatric): Secondary | ICD-10-CM | POA: Diagnosis not present

## 2020-08-24 DIAGNOSIS — Z7902 Long term (current) use of antithrombotics/antiplatelets: Secondary | ICD-10-CM | POA: Diagnosis not present

## 2020-08-24 DIAGNOSIS — Z8673 Personal history of transient ischemic attack (TIA), and cerebral infarction without residual deficits: Secondary | ICD-10-CM | POA: Diagnosis not present

## 2020-08-24 DIAGNOSIS — I6523 Occlusion and stenosis of bilateral carotid arteries: Secondary | ICD-10-CM | POA: Diagnosis not present

## 2020-08-24 DIAGNOSIS — Z79899 Other long term (current) drug therapy: Secondary | ICD-10-CM | POA: Diagnosis not present

## 2020-08-24 DIAGNOSIS — I639 Cerebral infarction, unspecified: Secondary | ICD-10-CM | POA: Diagnosis not present

## 2020-08-24 DIAGNOSIS — I252 Old myocardial infarction: Secondary | ICD-10-CM | POA: Diagnosis not present

## 2020-08-24 DIAGNOSIS — E785 Hyperlipidemia, unspecified: Secondary | ICD-10-CM | POA: Diagnosis not present

## 2020-08-25 DIAGNOSIS — I480 Paroxysmal atrial fibrillation: Secondary | ICD-10-CM | POA: Diagnosis not present

## 2020-08-25 DIAGNOSIS — I5022 Chronic systolic (congestive) heart failure: Secondary | ICD-10-CM | POA: Diagnosis not present

## 2020-08-25 DIAGNOSIS — E785 Hyperlipidemia, unspecified: Secondary | ICD-10-CM | POA: Diagnosis not present

## 2020-08-25 DIAGNOSIS — I2510 Atherosclerotic heart disease of native coronary artery without angina pectoris: Secondary | ICD-10-CM | POA: Diagnosis not present

## 2020-08-25 DIAGNOSIS — I252 Old myocardial infarction: Secondary | ICD-10-CM | POA: Diagnosis not present

## 2020-08-25 DIAGNOSIS — I639 Cerebral infarction, unspecified: Secondary | ICD-10-CM | POA: Diagnosis not present

## 2020-09-01 ENCOUNTER — Encounter: Payer: Self-pay | Admitting: Cardiology

## 2020-09-08 DIAGNOSIS — F0280 Dementia in other diseases classified elsewhere without behavioral disturbance: Secondary | ICD-10-CM | POA: Insufficient documentation

## 2020-09-08 DIAGNOSIS — J441 Chronic obstructive pulmonary disease with (acute) exacerbation: Secondary | ICD-10-CM | POA: Insufficient documentation

## 2020-09-08 DIAGNOSIS — F5104 Psychophysiologic insomnia: Secondary | ICD-10-CM | POA: Insufficient documentation

## 2020-09-08 DIAGNOSIS — F419 Anxiety disorder, unspecified: Secondary | ICD-10-CM | POA: Insufficient documentation

## 2020-09-08 DIAGNOSIS — C649 Malignant neoplasm of unspecified kidney, except renal pelvis: Secondary | ICD-10-CM | POA: Insufficient documentation

## 2020-09-08 DIAGNOSIS — K219 Gastro-esophageal reflux disease without esophagitis: Secondary | ICD-10-CM | POA: Insufficient documentation

## 2020-09-08 DIAGNOSIS — J45901 Unspecified asthma with (acute) exacerbation: Secondary | ICD-10-CM | POA: Insufficient documentation

## 2020-09-09 ENCOUNTER — Encounter: Payer: Self-pay | Admitting: Cardiology

## 2020-09-09 ENCOUNTER — Other Ambulatory Visit: Payer: Self-pay

## 2020-09-09 ENCOUNTER — Ambulatory Visit: Payer: PPO | Admitting: Cardiology

## 2020-09-09 VITALS — BP 134/76 | HR 57 | Ht 68.0 in | Wt 160.2 lb

## 2020-09-09 DIAGNOSIS — F0280 Dementia in other diseases classified elsewhere without behavioral disturbance: Secondary | ICD-10-CM | POA: Diagnosis not present

## 2020-09-09 DIAGNOSIS — E785 Hyperlipidemia, unspecified: Secondary | ICD-10-CM

## 2020-09-09 DIAGNOSIS — I6930 Unspecified sequelae of cerebral infarction: Secondary | ICD-10-CM | POA: Diagnosis not present

## 2020-09-09 DIAGNOSIS — I255 Ischemic cardiomyopathy: Secondary | ICD-10-CM | POA: Diagnosis not present

## 2020-09-09 DIAGNOSIS — G301 Alzheimer's disease with late onset: Secondary | ICD-10-CM

## 2020-09-09 DIAGNOSIS — I4821 Permanent atrial fibrillation: Secondary | ICD-10-CM | POA: Diagnosis not present

## 2020-09-09 MED ORDER — SACUBITRIL-VALSARTAN 24-26 MG PO TABS: 1.0000 | ORAL_TABLET | Freq: Two times a day (BID) | ORAL | 3 refills | Status: DC

## 2020-09-09 NOTE — Patient Instructions (Signed)
Medication Instructions:  Your physician has recommended you make the following change in your medication:  STOP: Cozaar START: Entresto 24/26 mg take one tablet by mouth twice daily.  *If you need a refill on your cardiac medications before your next appointment, please call your pharmacy*   Lab Work: Your physician recommends that you return for lab work in: 1 week BMP If you have labs (blood work) drawn today and your tests are completely normal, you will receive your results only by: [facility] (if you have MyChart) OR A paper copy in the mail If you have any lab test that is abnormal or we need to change your treatment, we will call you to review the results.   Testing/Procedures: None   Follow-Up: At [facility], you and your health needs are our priority.  As part of our continuing mission to provide you with exceptional heart care, we have created designated Provider Care Teams.  These Care Teams include your primary Cardiologist (physician) and Advanced Practice Providers (APPs -  Physician Assistants and Nurse Practitioners) who all work together to provide you with the care you need, when you need it.  We recommend signing up for the patient portal called "MyChart".  Sign up information is provided on this After Visit Summary.  MyChart is used to connect with patients for Virtual Visits (Telemedicine).  Patients are able to view lab/test results, encounter notes, upcoming appointments, etc.  Non-urgent messages can be sent to your provider as well.   To learn more about what you can do with MyChart, go to [email or web address].    Your next appointment:   3 month(s)  The format for your next appointment:   In Person  Provider:   Shirlee More, MD   Other Instructions

## 2020-09-09 NOTE — Progress Notes (Signed)
Cardiology Office Note:    Date:  09/09/2020   ID:  Lonnie Boyd, DOB 07-Apr-1940, MRN [id number]  PCP:  Lonnie Dress, MD  Cardiologist:  Lonnie Campus, MD    Referring MD: Lonnie Dress, MD   No chief complaint on file. I have sent to hospital  History of Present Illness:    Lonnie Boyd is a 80 y.o. male with past medical history significant for stress-induced cardiomyopathy.  In 2018 his wife passed and after that he ended up still having difficulties came to hospital get cardiac arrest was resuscitated echocardiogram has been performed which shows severely reduced left ventricular ejection fraction, at that time cardiac catheterization was done which showed nonobstructive disease.  He overall improved from ejection fraction point of view to latest ejection fraction 45 to 50%.  He also developed some dementia.  Recently he ended up being in [facility] because of acute CVA.  Also at that time echocardiogram showed diminished left ventricular ejection fraction down to 35% compared to 4045 before.  He was initiated on appropriate medication he was sent to [place] for follow-up. Overall he has no complaints.  History of stroke affected his ability to do things with his hands but all symptoms subsided and he is doing very well.  Denies have any chest pain tightness squeezing pressure burning chest no shortness of breath no swelling of lower extremities but have to admit that the history is somewhat unreliable because of his dementia.  He came along to our office today.  Past Medical History:  Diagnosis Date   Acute blood loss anemia    Adenocarcinoma of kidney (HCC)    AKI (acute kidney injury) ([place])    Anemia-HEME positive    Anxiety    CAD (coronary artery disease)    CAD- prior PCIs- cath- moderate CAD 09/22/16    Cardiac arrest with ventricular fibrillation ([place])    Cardiomyopathy ([place])    Chronic GERD    Chronic insomnia    Chronic obstructive asthma with acute  exacerbation (HCC)    Dementia ([place]) 09/04/2019   Dizziness 06/23/2017   Dyslipidemia 05/15/2015   Falls 05/24/2017   Gastrointestinal hemorrhage    Hyperlipemia    Ischemic cardiomyopathy    Late onset 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 (HCC)    Multiple closed fractures of ribs of both sides    Myocardial infarct [facility])    With stent to LAD/CX   NSTEMI (non-ST elevated myocardial infarction) ([place]) 09/20/2016   OSA (obstructive sleep apnea) 05/04/2019   PAF (paroxysmal atrial fibrillation) ([place]) 09/25/2016   Permanent atrial fibrillation ([place]) 03/01/2019   Torsades de pointes (HCC)    Ventral hernia     Past Surgical History:  Procedure Laterality Date   CATARACT EXTRACTION Bilateral 2019   INGUINAL HERNIA REPAIR Bilateral    LEFT HEART CATH AND CORONARY ANGIOGRAPHY N/A 09/20/2016   Procedure: LEFT HEART CATH AND CORONARY ANGIOGRAPHY;  Surgeon: Belva Crome, MD;  Location: [facility];  Service: Cardiovascular;  Laterality: N/A;   PARTIAL NEPHRECTOMY Left [facility]   TONSILLECTOMY  [id number]   UMBILICAL HERNIA REPAIR  02/2017    Current Medications: No outpatient medications have been marked as taking for the 09/09/20 encounter (Appointment) with Park Liter, MD.     Allergies:   Ace inhibitors   Social History   Socioeconomic History   Marital status: Married    Spouse name: Not on file  Number of children: Not on file   Years of education: Not on file   Highest education level: Not on file  Occupational History   Not on file  Tobacco Use   Smoking status: Never   Smokeless tobacco: Never  Vaping Use   Vaping Use: Never used  Substance and Sexual Activity   Alcohol use: No   Drug use: No   Sexual activity: Not on file  Other Topics Concern   Not on file  Social History Narrative   Not on file   Social Determinants of Health   Financial Resource Strain: Not on file  Food Insecurity: Not on  file  Transportation Needs: Not on file  Physical Activity: Not on file  Stress: Not on file  Social Connections: Not on file     Family History: The patient's family history includes Breast cancer in his mother; Heart failure in his father; Hypertension in his father; Leukemia in his sister; Prostate cancer in his father; Tuberculosis in his father. ROS:   Please see the history of present illness.    All 14 point review of systems negative except as described per history of present illness  EKGs/Labs/Other Studies Reviewed:      Recent Labs: 08/20/2020: ALT 13; Hemoglobin 16.3; Platelets PLATELET CLUMPS NOTED ON SMEAR, UNABLE TO ESTIMATE 08/21/2020: BUN 19; Creatinine, Ser 1.43; Potassium 4.1; Sodium 136  Recent Lipid Panel    Component Value Date/Time   CHOL 184 08/21/2020 0200   TRIG 140 08/21/2020 0200   HDL 30 (L) 08/21/2020 0200   CHOLHDL 6.1 08/21/2020 0200   VLDL 28 08/21/2020 0200   LDLCALC 126 (H) 08/21/2020 0200    Physical Exam:    VS:  There were no vitals taken for this visit.    Wt Readings from Last 3 Encounters:  08/25/20 159 lb (72.1 kg)  08/20/20 155 lb 10.3 oz (70.6 kg)  03/12/20 166 lb (75.3 kg)     GEN:  Well nourished, well developed in no acute distress HEENT: Normal NECK: No JVD; No carotid bruits LYMPHATICS: No lymphadenopathy CARDIAC: RRR, no murmurs, no rubs, no gallops RESPIRATORY:  Clear to auscultation without rales, wheezing or rhonchi  ABDOMEN: Soft, non-tender, non-distended MUSCULOSKELETAL:  No edema; No deformity  SKIN: Warm and dry LOWER EXTREMITIES: no swelling NEUROLOGIC:  Alert and oriented x 3 PSYCHIATRIC:  Normal affect   ASSESSMENT:    1. Ischemic cardiomyopathy   2. Permanent atrial fibrillation ([place])   3. Late effect of cerebrovascular accident (CVA)   4. Dyslipidemia   5. Late onset 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 (HCC)    PLAN:    In order of problems listed above:  Ischemic  cardiomyopathy ejection fraction is worse right now.  I will try to switch him from Cozaar to [facility].  We will give him samples of the medication for 1 month next week we will check his Chem-7.  We will continue rest of the medication which include Toprol-XL 25 daily. Permanent atrial fibrillation: Rate controlled, he is anticoagulated which I will continue dose appears to be good for his age weight and kidney function. Dyslipidemia recently his cholesterol medication has been increased. When I see him we will recheck his fasting ibuprofen.  K PN show me LDL of 126 HDL 38 need to be better than this.  Dementia.  Noted.  He still is quite functional though.    Medication Adjustments/Labs and Tests Ordered: Current medicines are reviewed at length with the patient today.  Concerns regarding medicines are outlined above.  No orders of the defined types were placed in this encounter.  Medication changes: No orders of the defined types were placed in this encounter.   Signed, Park Liter, MD, [facility] 09/09/2020 1:02 PM    [place]

## 2020-09-09 NOTE — Addendum Note (Signed)
Addended by: Resa Miner I on: 09/09/2020 01:29 PM   Modules accepted: Orders

## 2020-09-11 ENCOUNTER — Telehealth: Payer: Self-pay

## 2020-09-11 NOTE — Telephone Encounter (Signed)
Prior Auth for [employer] initiated, status is pending. Confirmation#[id number]

## 2020-09-15 ENCOUNTER — Telehealth: Payer: Self-pay

## 2020-09-15 NOTE — Telephone Encounter (Signed)
Entresto 24-'26mg'$  approved through 09/11/2021. Confirmation number ID: [id number]

## 2020-09-19 DIAGNOSIS — L089 Local infection of the skin and subcutaneous tissue, unspecified: Secondary | ICD-10-CM | POA: Diagnosis not present

## 2020-09-19 DIAGNOSIS — W57XXXA Bitten or stung by nonvenomous insect and other nonvenomous arthropods, initial encounter: Secondary | ICD-10-CM | POA: Diagnosis not present

## 2020-09-19 DIAGNOSIS — R21 Rash and other nonspecific skin eruption: Secondary | ICD-10-CM | POA: Diagnosis not present

## 2020-09-19 DIAGNOSIS — T07XXXA Unspecified multiple injuries, initial encounter: Secondary | ICD-10-CM | POA: Diagnosis not present

## 2020-09-27 ENCOUNTER — Other Ambulatory Visit: Payer: Self-pay

## 2020-09-27 ENCOUNTER — Emergency Department (HOSPITAL_COMMUNITY): Payer: PPO

## 2020-09-27 ENCOUNTER — Inpatient Hospital Stay (HOSPITAL_COMMUNITY)
Admission: EM | Admit: 2020-09-27 | Discharge: 2020-10-01 | DRG: 281 | Disposition: A | Discharge status: Home / Self Care | Payer: PPO | Attending: Cardiovascular Disease | Admitting: Cardiovascular Disease

## 2020-09-27 ENCOUNTER — Encounter (HOSPITAL_COMMUNITY)
Admission: EM | Disposition: A | Discharge status: Home / Self Care | Payer: Self-pay | Source: Home / Self Care | Attending: Cardiovascular Disease

## 2020-09-27 DIAGNOSIS — I255 Ischemic cardiomyopathy: Secondary | ICD-10-CM | POA: Diagnosis not present

## 2020-09-27 DIAGNOSIS — Z20822 Contact with and (suspected) exposure to covid-19: Secondary | ICD-10-CM | POA: Diagnosis present

## 2020-09-27 DIAGNOSIS — Z7901 Long term (current) use of anticoagulants: Secondary | ICD-10-CM

## 2020-09-27 DIAGNOSIS — Z8674 Personal history of sudden cardiac arrest: Secondary | ICD-10-CM | POA: Diagnosis not present

## 2020-09-27 DIAGNOSIS — E785 Hyperlipidemia, unspecified: Secondary | ICD-10-CM | POA: Diagnosis present

## 2020-09-27 DIAGNOSIS — H9190 Unspecified hearing loss, unspecified ear: Secondary | ICD-10-CM | POA: Diagnosis not present

## 2020-09-27 DIAGNOSIS — I213 ST elevation (STEMI) myocardial infarction of unspecified site: Secondary | ICD-10-CM

## 2020-09-27 DIAGNOSIS — Z8673 Personal history of transient ischemic attack (TIA), and cerebral infarction without residual deficits: Secondary | ICD-10-CM

## 2020-09-27 DIAGNOSIS — K449 Diaphragmatic hernia without obstruction or gangrene: Secondary | ICD-10-CM | POA: Diagnosis not present

## 2020-09-27 DIAGNOSIS — I5023 Acute on chronic systolic (congestive) heart failure: Secondary | ICD-10-CM | POA: Diagnosis not present

## 2020-09-27 DIAGNOSIS — I5042 Chronic combined systolic (congestive) and diastolic (congestive) heart failure: Secondary | ICD-10-CM | POA: Diagnosis not present

## 2020-09-27 DIAGNOSIS — I4891 Unspecified atrial fibrillation: Secondary | ICD-10-CM | POA: Diagnosis not present

## 2020-09-27 DIAGNOSIS — I110 Hypertensive heart disease with heart failure: Secondary | ICD-10-CM | POA: Diagnosis present

## 2020-09-27 DIAGNOSIS — G301 Alzheimer's disease with late onset: Secondary | ICD-10-CM | POA: Diagnosis present

## 2020-09-27 DIAGNOSIS — T82855A Stenosis of coronary artery stent, initial encounter: Principal | ICD-10-CM | POA: Diagnosis present

## 2020-09-27 DIAGNOSIS — R001 Bradycardia, unspecified: Secondary | ICD-10-CM | POA: Diagnosis present

## 2020-09-27 DIAGNOSIS — I4811 Longstanding persistent atrial fibrillation: Secondary | ICD-10-CM | POA: Diagnosis not present

## 2020-09-27 DIAGNOSIS — I2109 ST elevation (STEMI) myocardial infarction involving other coronary artery of anterior wall: Secondary | ICD-10-CM | POA: Diagnosis not present

## 2020-09-27 DIAGNOSIS — I4821 Permanent atrial fibrillation: Secondary | ICD-10-CM | POA: Diagnosis present

## 2020-09-27 DIAGNOSIS — I2510 Atherosclerotic heart disease of native coronary artery without angina pectoris: Secondary | ICD-10-CM | POA: Diagnosis present

## 2020-09-27 DIAGNOSIS — I5041 Acute combined systolic (congestive) and diastolic (congestive) heart failure: Secondary | ICD-10-CM | POA: Diagnosis not present

## 2020-09-27 DIAGNOSIS — R079 Chest pain, unspecified: Secondary | ICD-10-CM | POA: Diagnosis not present

## 2020-09-27 DIAGNOSIS — R0789 Other chest pain: Secondary | ICD-10-CM | POA: Diagnosis not present

## 2020-09-27 DIAGNOSIS — Z5181 Encounter for therapeutic drug level monitoring: Secondary | ICD-10-CM | POA: Diagnosis not present

## 2020-09-27 DIAGNOSIS — Z888 Allergy status to other drugs, medicaments and biological substances status: Secondary | ICD-10-CM | POA: Diagnosis not present

## 2020-09-27 DIAGNOSIS — I428 Other cardiomyopathies: Secondary | ICD-10-CM | POA: Diagnosis not present

## 2020-09-27 DIAGNOSIS — I10 Essential (primary) hypertension: Secondary | ICD-10-CM | POA: Diagnosis not present

## 2020-09-27 DIAGNOSIS — E7800 Pure hypercholesterolemia, unspecified: Secondary | ICD-10-CM | POA: Diagnosis not present

## 2020-09-27 DIAGNOSIS — Y831 Surgical operation with implant of artificial internal device as the cause of abnormal reaction of the patient, or of later complication, without mention of misadventure at the time of the procedure: Secondary | ICD-10-CM | POA: Diagnosis present

## 2020-09-27 DIAGNOSIS — R110 Nausea: Secondary | ICD-10-CM | POA: Diagnosis not present

## 2020-09-27 LAB — CBC WITH DIFFERENTIAL/PLATELET
Abs Immature Granulocytes: 0.05 10*3/uL (ref 0.00–0.07)
Basophils Absolute: 0.1 10*3/uL (ref 0.0–0.1)
Basophils Relative: 1 %
Eosinophils Absolute: 0.4 10*3/uL (ref 0.0–0.5)
Eosinophils Relative: 5 %
HCT: 44.9 % (ref 39.0–52.0)
Hemoglobin: 14.8 g/dL (ref 13.0–17.0)
Immature Granulocytes: 1 %
Lymphocytes Relative: 15 %
Lymphs Abs: 1.3 10*3/uL (ref 0.7–4.0)
MCH: 32.7 pg (ref 26.0–34.0)
MCHC: 33 g/dL (ref 30.0–36.0)
MCV: 99.3 fL (ref 80.0–100.0)
Monocytes Absolute: 1.1 10*3/uL — ABNORMAL HIGH (ref 0.1–1.0)
Monocytes Relative: 12 %
Neutro Abs: 5.8 10*3/uL (ref 1.7–7.7)
Neutrophils Relative %: 66 %
Platelets: 270 10*3/uL (ref 150–400)
RBC: 4.52 MIL/uL (ref 4.22–5.81)
RDW: 13.5 % (ref 11.5–15.5)
WBC: 8.7 10*3/uL (ref 4.0–10.5)
nRBC: 0 % (ref 0.0–0.2)

## 2020-09-27 LAB — COMPREHENSIVE METABOLIC PANEL
ALT: 26 U/L (ref 0–44)
AST: 57 U/L — ABNORMAL HIGH (ref 15–41)
Albumin: 3.6 g/dL (ref 3.5–5.0)
Alkaline Phosphatase: 77 U/L (ref 38–126)
Anion gap: 8 (ref 5–15)
BUN: 29 mg/dL — ABNORMAL HIGH (ref 8–23)
CO2: 25 mmol/L (ref 22–32)
Calcium: 9 mg/dL (ref 8.9–10.3)
Chloride: 104 mmol/L (ref 98–111)
Creatinine, Ser: 1.24 mg/dL (ref 0.61–1.24)
GFR, Estimated: 58 mL/min — ABNORMAL LOW (ref >60)
Glucose, Bld: 113 mg/dL — ABNORMAL HIGH (ref 70–99)
Potassium: 4.1 mmol/L (ref 3.5–5.1)
Sodium: 137 mmol/L (ref 135–145)
Total Bilirubin: 1.3 mg/dL — ABNORMAL HIGH (ref 0.3–1.2)
Total Protein: 6.6 g/dL (ref 6.5–8.1)

## 2020-09-27 LAB — LIPID PANEL
Cholesterol: 203 mg/dL — ABNORMAL HIGH (ref 0–200)
HDL: 29 mg/dL — ABNORMAL LOW (ref >40)
LDL Cholesterol: 147 mg/dL — ABNORMAL HIGH (ref 0–99)
Total CHOL/HDL Ratio: 7 RATIO
Triglycerides: 136 mg/dL (ref <150)
VLDL: 27 mg/dL (ref 0–40)

## 2020-09-27 LAB — APTT: aPTT: 31 seconds (ref 24–36)

## 2020-09-27 LAB — PROTIME-INR
INR: 1.2 (ref 0.8–1.2)
Prothrombin Time: 14.9 seconds (ref 11.4–15.2)

## 2020-09-27 LAB — RESP PANEL BY RT-PCR (FLU A&B, COVID) ARPGX2
Influenza A by PCR: NEGATIVE
Influenza B by PCR: NEGATIVE
SARS Coronavirus 2 by RT PCR: NEGATIVE

## 2020-09-27 LAB — TROPONIN I (HIGH SENSITIVITY)
Troponin I (High Sensitivity): 5942 ng/L (ref <18)
Troponin I (High Sensitivity): >24000 ng/L (ref <18)

## 2020-09-27 LAB — HEMOGLOBIN A1C
Hgb A1c MFr Bld: 6.2 % — ABNORMAL HIGH (ref 4.8–5.6)
Mean Plasma Glucose: 131.24 mg/dL

## 2020-09-27 SURGERY — CORONARY/GRAFT ACUTE MI REVASCULARIZATION
Anesthesia: LOCAL

## 2020-09-27 MED ORDER — NITROGLYCERIN 1 MG/10 ML FOR IR/CATH LAB
INTRA_ARTERIAL | Status: AC
  Filled 2020-09-27: qty 10

## 2020-09-27 MED ORDER — LIDOCAINE HCL (PF) 1 % IJ SOLN
INTRAMUSCULAR | Status: AC
  Filled 2020-09-27: qty 30

## 2020-09-27 MED ORDER — LOSARTAN POTASSIUM 50 MG PO TABS
50.0000 mg | ORAL_TABLET | Freq: Every day | ORAL | Status: DC
  Administered 2020-09-28 – 2020-09-29 (×2): 50 mg via ORAL
  Filled 2020-09-27 (×2): qty 1

## 2020-09-27 MED ORDER — HEPARIN SODIUM (PORCINE) 1000 UNIT/ML IJ SOLN
INTRAMUSCULAR | Status: AC
  Filled 2020-09-27: qty 1

## 2020-09-27 MED ORDER — ATORVASTATIN CALCIUM 80 MG PO TABS
80.0000 mg | ORAL_TABLET | Freq: Every day | ORAL | Status: DC
  Administered 2020-09-28 – 2020-10-01 (×4): 80 mg via ORAL
  Filled 2020-09-27 (×4): qty 1

## 2020-09-27 MED ORDER — HEPARIN (PORCINE) 25000 UT/250ML-% IV SOLN
1200.0000 [IU]/h | INTRAVENOUS | Status: DC
  Administered 2020-09-27: 900 [IU]/h via INTRAVENOUS
  Administered 2020-09-28: 1300 [IU]/h via INTRAVENOUS
  Administered 2020-09-29: 1200 [IU]/h via INTRAVENOUS
  Filled 2020-09-27 (×4): qty 250

## 2020-09-27 MED ORDER — SODIUM CHLORIDE 0.9 % IV SOLN: INTRAVENOUS | Status: DC

## 2020-09-27 MED ORDER — ASPIRIN 81 MG PO CHEW
324.0000 mg | CHEWABLE_TABLET | Freq: Once | ORAL | Status: AC
  Administered 2020-09-27: 324 mg via ORAL
  Filled 2020-09-27: qty 4

## 2020-09-27 MED ORDER — HEPARIN SODIUM (PORCINE) 5000 UNIT/ML IJ SOLN
4000.0000 [IU] | Freq: Once | INTRAMUSCULAR | Status: AC
  Administered 2020-09-27: 4000 [IU] via INTRAVENOUS
  Filled 2020-09-27: qty 1

## 2020-09-27 MED ORDER — NITROGLYCERIN 0.4 MG SL SUBL
0.4000 mg | SUBLINGUAL_TABLET | SUBLINGUAL | Status: DC | PRN
  Administered 2020-09-27: 0.4 mg via SUBLINGUAL
  Filled 2020-09-27: qty 1

## 2020-09-27 MED ORDER — ASPIRIN EC 81 MG PO TBEC
81.0000 mg | DELAYED_RELEASE_TABLET | Freq: Every day | ORAL | Status: DC
  Administered 2020-09-28 – 2020-09-30 (×3): 81 mg via ORAL
  Filled 2020-09-27 (×3): qty 1

## 2020-09-27 MED ORDER — ACETAMINOPHEN 325 MG PO TABS
650.0000 mg | ORAL_TABLET | ORAL | Status: DC | PRN
Start: 2020-09-27 — End: 2020-09-30

## 2020-09-27 MED ORDER — CLOPIDOGREL BISULFATE 75 MG PO TABS
75.0000 mg | ORAL_TABLET | Freq: Every day | ORAL | Status: DC
  Administered 2020-09-28 – 2020-10-01 (×4): 75 mg via ORAL
  Filled 2020-09-27 (×4): qty 1

## 2020-09-27 MED ORDER — NITROGLYCERIN IN D5W 200-5 MCG/ML-% IV SOLN
0.0000 ug/min | INTRAVENOUS | Status: DC
  Administered 2020-09-28: 10 ug/min via INTRAVENOUS
  Filled 2020-09-27: qty 250

## 2020-09-27 MED ORDER — HEPARIN (PORCINE) IN NACL 1000-0.9 UT/500ML-% IV SOLN
INTRAVENOUS | Status: AC
  Filled 2020-09-27: qty 1000

## 2020-09-27 MED ORDER — VERAPAMIL HCL 2.5 MG/ML IV SOLN
INTRAVENOUS | Status: AC
  Filled 2020-09-27: qty 2

## 2020-09-27 MED ORDER — METOPROLOL SUCCINATE ER 25 MG PO TB24
25.0000 mg | ORAL_TABLET | Freq: Every day | ORAL | Status: DC
  Administered 2020-09-28 – 2020-09-29 (×2): 25 mg via ORAL
  Filled 2020-09-27 (×3): qty 1

## 2020-09-27 NOTE — ED Notes (Signed)
Attempted to call report with no answer to unit 4E

## 2020-09-27 NOTE — ED Notes (Signed)
Informed MD of pt's Critical troponin 5942, just now processed by lab. Sample was sent over 2 hours ago

## 2020-09-27 NOTE — ED Triage Notes (Signed)
Pt from home via [facility] after an episode of sudden 9/10 central chest pain w SOB. Son also reported left-sided facial droop, not noted by EMS or upon arrival to ER. Significant cardiac history and multiple strokes reported. Pt a/o x 4 and denies pain on arrival. No obvious neuro deficits noted.

## 2020-09-27 NOTE — ED Provider Notes (Signed)
[facility] EMERGENCY DEPARTMENT Provider Note   CSN: [id number] Arrival date & time: 09/27/20  1602     History Chief Complaint  Patient presents with   Chest Pain    Lonnie Boyd is a 80 y.o. male with PMHx HTN, HLD, CAD (s/p LAD, Cx stents), ischemic cardiomyopathy (LVEF 45 to 50%), atrial fibrillation (on Eliquis), Alzheimer's disease who presents for evaluation of chest pain.   Patient reports approximately 3-hour history of non-radiating substernal chest pressure.  He states that symptoms began while he was at rest.  He reports associated shortness of breath.  No recent fever or chills.  Denies any numbness or weakness.  No syncope.  No lower extremity edema.  He denies any episodes of chest pain over the preceding weeks.  He states that he was advised to present to the emergency department for further evaluation.     Past Medical History:  Diagnosis Date   Acute blood loss anemia    Adenocarcinoma of kidney (HCC)    AKI (acute kidney injury) ([place])    Anemia-HEME positive    Anxiety    CAD (coronary artery disease)    CAD- prior PCIs- cath- moderate CAD 09/22/16    Cardiac arrest with ventricular fibrillation (HCC)    Cardiomyopathy ([place])    Chronic GERD    Chronic insomnia    Chronic obstructive asthma with acute exacerbation ([place])    Dementia ([place]) 09/04/2019   Dizziness 06/23/2017   Dyslipidemia 05/15/2015   Falls 05/24/2017   Gastrointestinal hemorrhage    Hyperlipemia    Ischemic cardiomyopathy    Late onset 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 (HCC)    Multiple closed fractures of ribs of both sides    Myocardial infarct [facility])    With stent to LAD/CX   NSTEMI (non-ST elevated myocardial infarction) ([place]) 09/20/2016   OSA (obstructive sleep apnea) 05/04/2019   PAF (paroxysmal atrial fibrillation) ([place]) 09/25/2016   Permanent atrial fibrillation ([place]) 03/01/2019   Torsades de pointes ([place])    Ventral hernia     Patient  Active Problem List   Diagnosis Date Noted   Acute ST elevation myocardial infarction (STEMI) of anterior wall ([place]) 09/27/2020   Late effect of cerebrovascular accident (CVA) 09/09/2020   Chronic obstructive asthma with acute exacerbation ([place]) 09/08/2020   Chronic insomnia 09/08/2020   Chronic GERD 09/08/2020   Anxiety 09/08/2020   Adenocarcinoma of kidney ([place]) 09/08/2020   Late onset 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 ([place]) 09/08/2020   CVA (cerebral vascular accident) ([place]) 08/21/2020   PNA (pneumonia) 08/20/2020   Stroke (cerebrum) ([place]) 08/20/2020   TIA (transient ischemic attack) 08/20/2020   Ventral hernia    Hyperlipemia    CAD (coronary artery disease)    Dementia ([place]) 09/04/2019   OSA (obstructive sleep apnea) 05/04/2019   Permanent atrial fibrillation ([place]) 03/01/2019   Dizziness 06/23/2017   Falls 05/24/2017   Cardiomyopathy ([place]) 02/15/2017   Acute blood loss anemia    Gastrointestinal hemorrhage    Multiple closed fractures of ribs of both sides    PAF (paroxysmal atrial fibrillation) ([place]) 09/25/2016   Anemia-HEME positive    NSTEMI (non-ST elevated myocardial infarction) ([place]) 09/20/2016   Ischemic cardiomyopathy    CAD- prior PCIs- cath- moderate CAD 09/22/16    Torsades de pointes [facility])    Cardiac arrest with ventricular fibrillation ([place])    Dyslipidemia 05/15/2015    Past Surgical History:  Procedure Laterality Date   CATARACT EXTRACTION Bilateral 2019  INGUINAL HERNIA REPAIR Bilateral    LEFT HEART CATH AND CORONARY ANGIOGRAPHY N/A 09/20/2016   Procedure: LEFT HEART CATH AND CORONARY ANGIOGRAPHY;  Surgeon: Belva Crome, MD;  Location: [facility] CV LAB;  Service: Cardiovascular;  Laterality: N/A;   PARTIAL NEPHRECTOMY Left [facility]   TONSILLECTOMY  [id number]   UMBILICAL HERNIA REPAIR  02/2017       Family History  Problem Relation Age of Onset   Breast cancer Mother    Hypertension Father     Heart failure Father    Prostate cancer Father    Tuberculosis Father    Leukemia Sister     Social History   Tobacco Use   Smoking status: Never   Smokeless tobacco: Never  Vaping Use   Vaping Use: Never used  Substance Use Topics   Alcohol use: No   Drug use: No    Home Medications Prior to Admission medications   Medication Sig Start Date End Date Taking? Authorizing Provider  albuterol (PROVENTIL) (2.5 MG/3ML) 0.083% nebulizer solution Take 2.5 mg by nebulization every 4 (four) hours as needed for shortness of breath. 08/18/16  Yes [provider]  albuterol (VENTOLIN HFA) 108 (90 Base) MCG/ACT inhaler Inhale 2 puffs into the lungs every 6 (six) hours as needed for wheezing or shortness of breath.   Yes [provider]  ALPRAZolam (XANAX) 0.25 MG tablet Take 0.25 mg by mouth 2 (two) times daily as needed for anxiety or sleep. for anxiety 07/19/17  Yes [provider]  apixaban (ELIQUIS) 5 MG TABS tablet Take 5 mg by mouth 2 (two) times daily.   Yes [provider]  atorvastatin (LIPITOR) 80 MG tablet Take 80 mg by mouth daily.   Yes [provider]  clopidogrel (PLAVIX) 75 MG tablet Take 1 tablet (75 mg total) by mouth daily. 08/22/20  Yes Park Liter, MD  donepezil (ARICEPT) 5 MG tablet Take 5 mg by mouth at bedtime. 08/24/19  Yes [provider]  Esomeprazole Magnesium (NEXIUM 24HR PO) Take 20 mg by mouth daily.   Yes [provider]  losartan (COZAAR) 50 MG tablet Take 50 mg by mouth daily.   Yes [provider]  memantine (NAMENDA) 10 MG tablet Take 10 mg by mouth 2 (two) times daily. 09/24/19  Yes [provider]  metoprolol succinate (TOPROL-XL) 25 MG 24 hr tablet Take 1 tablet (25 mg total) by mouth daily. Take with or immediately following a meal. 08/22/20  Yes Park Liter, MD  nitroGLYCERIN (NITROSTAT) 0.4 MG SL tablet Place 0.4 mg under the tongue every 5 (five) minutes as  needed for chest pain.   Yes [provider]  pantoprazole (PROTONIX) 40 MG tablet Take 40 mg by mouth daily.   Yes [provider]  sacubitril-valsartan (ENTRESTO) 24-26 MG Take 1 tablet by mouth 2 (two) times daily. Patient not taking: No sig reported 09/09/20   Park Liter, MD    Allergies    Ace inhibitors  Review of Systems   Review of Systems  Constitutional:  Negative for chills and fever.  HENT:  Negative for ear pain and sore throat.   Eyes:  Negative for pain and visual disturbance.  Respiratory:  Positive for shortness of breath. Negative for cough.   Cardiovascular:  Positive for chest pain. Negative for palpitations.  Gastrointestinal:  Negative for abdominal pain and vomiting.  Genitourinary:  Negative for dysuria and hematuria.  Musculoskeletal:  Negative for arthralgias and back pain.  Skin:  Negative for color change and rash.  Neurological:  Negative for seizures and syncope.  All other systems reviewed and are negative.  Physical Exam Updated Vital Signs BP (!) 146/88 (BP Location: Left Arm)   Pulse 65   Temp 97.8 F (36.6 C) (Oral)   Resp 13   Ht '5\' 8"'$  (1.727 m)   Wt 74.8 kg   SpO2 99%   BMI 25.09 kg/m   Physical Exam Vitals and nursing note reviewed.  Constitutional:      Appearance: He is well-developed.  HENT:     Head: Normocephalic and atraumatic.  Eyes:     Conjunctiva/sclera: Conjunctivae normal.  Cardiovascular:     Rate and Rhythm: Normal rate and regular rhythm.     Heart sounds: No murmur heard. Pulmonary:     Effort: Pulmonary effort is normal. No respiratory distress.     Breath sounds: Normal breath sounds.  Abdominal:     Palpations: Abdomen is soft.     Tenderness: There is no abdominal tenderness.  Musculoskeletal:     Cervical back: Neck supple.  Skin:    General: Skin is warm and dry.  Neurological:     Mental Status: He is alert.    ED Results / Procedures / Treatments   Labs (all labs  ordered are listed, but only abnormal results are displayed) Labs Reviewed  HEMOGLOBIN A1C - Abnormal; Notable for the following components:      Result Value   Hgb A1c MFr Bld 6.2 (*)    All other components within normal limits  CBC WITH DIFFERENTIAL/PLATELET - Abnormal; Notable for the following components:   Monocytes Absolute 1.1 (*)    All other components within normal limits  COMPREHENSIVE METABOLIC PANEL - Abnormal; Notable for the following components:   Glucose, Bld 113 (*)    BUN 29 (*)    AST 57 (*)    Total Bilirubin 1.3 (*)    GFR, Estimated 58 (*)    All other components within normal limits  LIPID PANEL - Abnormal; Notable for the following components:   Cholesterol 203 (*)    HDL 29 (*)    LDL Cholesterol 147 (*)    All other components within normal limits  TROPONIN I (HIGH SENSITIVITY) - Abnormal; Notable for the following components:   Troponin I (High Sensitivity) 5,942 (*)    All other components within normal limits  RESP PANEL BY RT-PCR (FLU A&B, COVID) ARPGX2  PROTIME-INR  APTT  BRAIN NATRIURETIC PEPTIDE  APTT  HEPARIN LEVEL (UNFRACTIONATED)  CBC  BASIC METABOLIC PANEL  LIPID PANEL    EKG EKG Interpretation  Date/Time:  Saturday September 27 2020 16:40:09 EDT Ventricular Rate:  74 PR Interval:    QRS Duration: 96 QT Interval:  383 QTC Calculation: 408 R Axis:   -86 Text Interpretation: Atrial fibrillation Ventricular premature complex Inferior infarct, old Anterior infarct, old Lateral leads are also involved Confirmed by Madalyn Rob [phone]) on 09/27/2020 6:13:45 PM  Radiology DG Chest Port 1 View  Result Date: 09/27/2020 CLINICAL DATA:  Chest pain EXAM: PORTABLE CHEST 1 VIEW COMPARISON:  Radiograph 02/06/2020, chest CT 09/26/2006 FINDINGS: Unchanged, enlarged cardiac silhouette. There is no new focal airspace disease. There is no large pleural effusion or visible pneumothorax. Gas distended large hiatal hernia. Bilateral shoulder  degenerative changes. There is no acute osseous abnormality. IMPRESSION: Unchanged, enlarged cardiac silhouette. No new focal airspace disease. Gas distended large hiatal hernia. Electronically  Signed   By: Maurine Simmering M.D.   On: 09/27/2020 16:33    Procedures Procedures   Medications Ordered in ED Medications  0.9 %  sodium chloride infusion ( Intravenous New Bag/Given 09/27/20 1623)  nitroGLYCERIN (NITROSTAT) SL tablet 0.4 mg (0.4 mg Sublingual Given 09/27/20 1618)  atorvastatin (LIPITOR) tablet 80 mg (has no administration in time range)  losartan (COZAAR) tablet 50 mg (has no administration in time range)  metoprolol succinate (TOPROL-XL) 24 hr tablet 25 mg (has no administration in time range)  clopidogrel (PLAVIX) tablet 75 mg (has no administration in time range)  aspirin EC tablet 81 mg (has no administration in time range)  acetaminophen (TYLENOL) tablet 650 mg (has no administration in time range)  nitroGLYCERIN 50 mg in dextrose 5 % 250 mL (0.2 mg/mL) infusion (has no administration in time range)  heparin ADULT infusion 100 units/mL (25000 units/251m) (900 Units/hr Intravenous New Bag/Given 09/27/20 1834)  aspirin chewable tablet 324 mg (324 mg Oral Given 09/27/20 1619)  heparin injection 4,000 Units (4,000 Units Intravenous Given 09/27/20 1620)    ED Course  I have reviewed the triage vital signs and the nursing notes.  Pertinent labs & imaging results that were available during my care of the patient were reviewed by me and considered in my medical decision making (see chart for details).  Clinical Course as of 09/27/20 2357  Sat Sep 27, 2020  1610 Reviewed EKG, significant ST elevations, pt has some active chest "pressure" will active STEMI alert [RD]    Clinical Course User Index [RD] DLucrezia Starch MD   MDM Rules/Calculators/A&P                           80y.o. male with past medical history as above who presents for evaluation of chest pressure.  Afebrile and  hemodynamically stable here.  Currently on room oxygen.  Initial EKG is notable for ST elevations in V3, V4, and V5, concerning for anterolateral STEMI.  Code STEMI was activated.  Patient was given chewable aspirin load as well as heparin bolus.  Case was discussed with cardiology, who evaluated the patient at bedside.  CBC and CMP are vastly unremarkable.  Initial troponin was elevated at 5942.  Per cardiology, the patient's presentation is consistent with STEMI with delayed presentation.  Given this as well as his ongoing Eliquis use, they will not proceed to the Cath Lab at this time.  Cardiology will plan to admit the patient for further management.   Final Clinical Impression(s) / ED Diagnoses Final diagnoses:  ST elevation myocardial infarction (STEMI), unspecified artery ([facility]    Rx / DC Orders ED Discharge Orders     None        HViolet Baldy MD 09/28/20 0000    DLucrezia Starch MD 09/28/20 1531

## 2020-09-27 NOTE — ED Notes (Signed)
2nd attempt to call report but RN is not available and will call back shortly

## 2020-09-27 NOTE — Progress Notes (Signed)
ANTICOAGULATION CONSULT NOTE - Initial Consult  Pharmacy Consult for heparin Indication: chest pain/ACS  Allergies  Allergen Reactions   Ace Inhibitors Cough    Patient Measurements: Height: '5\' 8"'$  (172.7 cm) Weight: 74.8 kg (165 lb) IBW/kg (Calculated) : 68.4 Heparin Dosing Weight: 74.8 kg  Vital Signs: Temp: 98.3 F (36.8 C) (09/03 1541) Temp Source: Oral (09/03 1541) BP: 160/95 (09/03 1700) Pulse Rate: 73 (09/03 1541)  Labs: Recent Labs    09/27/20 1611  HGB 14.8  HCT 44.9  PLT 270  APTT 31  LABPROT 14.9  INR 1.2    CrCl cannot be calculated (Patient's most recent lab result is older than the maximum 21 days allowed.).   Medical History: Past Medical History:  Diagnosis Date   Acute blood loss anemia    Adenocarcinoma of kidney (HCC)    AKI (acute kidney injury) ([place])    Anemia-HEME positive    Anxiety    CAD (coronary artery disease)    CAD- prior PCIs- cath- moderate CAD 09/22/16    Cardiac arrest with ventricular fibrillation (HCC)    Cardiomyopathy ([place])    Chronic GERD    Chronic insomnia    Chronic obstructive asthma with acute exacerbation (HCC)    Dementia ([place]) 09/04/2019   Dizziness 06/23/2017   Dyslipidemia 05/15/2015   Falls 05/24/2017   Gastrointestinal hemorrhage    Hyperlipemia    Ischemic cardiomyopathy    Late onset 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 (HCC)    Multiple closed fractures of ribs of both sides    Myocardial infarct [facility])    With stent to LAD/CX   NSTEMI (non-ST elevated myocardial infarction) ([place]) 09/20/2016   OSA (obstructive sleep apnea) 05/04/2019   PAF (paroxysmal atrial fibrillation) ([place]) 09/25/2016   Permanent atrial fibrillation ([place]) 03/01/2019   Torsades de pointes (HCC)    Ventral hernia    Medications: see MAR  Assessment: 80 yo M with CAD, cardiomyopathy, HLD, dementia, pAF (PTA eliquis, last dose 9/3 AM), recent CVA in July 2022, with CP. Was called as Code STEMI but cancelled, now  heparin consult for ACS.   Goal of Therapy:  Heparin level 0.3-0.7 units/ml aPTT 66-102 seconds Monitor platelets by anticoagulation protocol: Yes   Plan: as part of Code STEMI earlier, pt received 4000u heparin bolus so no bolus now, and will Start heparin infusion at 900 units/hr Continue to monitor H&H and platelets F/u 8h aPTT/HL Monitor daily aPTT, HL and CBC  Joetta Manners, PharmD, [facility] Emergency Medicine Clinical Pharmacist ED RPh Phone: [place]: [phone]

## 2020-09-27 NOTE — H&P (Addendum)
Cardiology Admission History and Physical:   Patient ID: Lonnie Boyd MRN: [id number]; DOB: Lonnie Boyd, Lonnie Boyd   Admission date: 09/27/2020  PCP:  [name], MD   [facility] Providers Cardiologist:  Jenne Campus, MD   Chief Complaint:  Chest pain  Patient Profile:   Lonnie Boyd is a 80 y.o. male with CAD with LAD and left circumflex PCI in 2011, VT/VF arrest in August 2018, stress-induced cardiomyopathy, hyperlipidemia, dementia, permanent atrial fibrillation, and recent acute CVA in July 2022 who is being seen 09/27/2020 for the evaluation of chest pain.  History of Present Illness:   Lonnie Boyd is a pleasant 80 year old male was past medical history of CAD with LAD and left circumflex PCI in 2011, VT/VF arrest in August 2018, stress-induced cardiomyopathy, hyperlipidemia, dementia, permanent atrial fibrillation, and recent acute CVA in July 2022.  Last cardiac catheterization performed at the time of VT/VF arrest in August 2018 demonstrated no culprit lesion.  He had 20 to 30% mid left main lesion which decreased in severity as procedure progressed suggesting catheter induced spasm, 40% mid RCA was 60% PDA lesion, patent proximal to mid LAD stent with diffuse 40 to 50% in-stent restenosis, patent left circumflex stent was 40% in-stent restenosis, 65 to 70% restenosis in proximal left circumflex stent.  Echocardiogram obtained 09/20/2016 showed EF 20 to 25%, grade 2 DD, PA peak pressure 42 mmHg, mild MR.  By September 2018, EF has improved to 45 to 50% by echo.  Ejection fraction is unchanged on repeat echocardiogram in November 2021.  More recently, patient presented in July 2022 with acute CVA.  CTA of the head and neck showed bilateral ICA stenosis in the moderate range.  MRI showed scattered right MCA infarcts.  Hemoglobin A1c was 6.5, placing the patient in the prediabetes range.  Echocardiogram obtained on 08/13/2020 showed a drop in the ejection fraction down to 30 to 35%.   Patient was discharged on triple therapy including aspirin, Plavix and Eliquis.  Unfortunately, given his severe dementia and hard of hearing, he is a very poor historian.  He was seen by Dr. Agustin Cree on 09/09/2020 who has been titrating his heart failure therapy.  He presented to [facility] ED on 09/27/2020 with chest pain that was started slightly prior to lunchtime today.  Chest pain lasted around 2 to 3 hours however he has been chest pain-free for close to 2 hours now.  Blood work is currently pending.  EKG however showed significant ST elevation with Q waves in the anterior leads.  This is significantly changed when compared to the previous EKG from July.  Unfortunately patient has taken all of his medication this morning including the Eliquis.  Given the lack of chest pain at the time interview and last dose of Eliquis this morning, code STEMI was canceled.    Past Medical History:  Diagnosis Date   Acute blood loss anemia    Adenocarcinoma of kidney (HCC)    AKI (acute kidney injury) ([place])    Anemia-HEME positive    Anxiety    CAD (coronary artery disease)    CAD- prior PCIs- cath- moderate CAD 09/22/16    Cardiac arrest with ventricular fibrillation ([place])    Cardiomyopathy ([place])    Chronic GERD    Chronic insomnia    Chronic obstructive asthma with acute exacerbation ([place])    Dementia ([place]) 09/04/2019   Dizziness 06/23/2017   Dyslipidemia 05/15/2015   Falls 05/24/2017   Gastrointestinal hemorrhage    Hyperlipemia  Ischemic cardiomyopathy    Late onset 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 ([place])    Multiple closed fractures of ribs of both sides    Myocardial infarct [facility])    With stent to LAD/CX   NSTEMI (non-ST elevated myocardial infarction) ([place]) 09/20/2016   OSA (obstructive sleep apnea) 05/04/2019   PAF (paroxysmal atrial fibrillation) ([place]) 09/25/2016   Permanent atrial fibrillation ([place]) 03/01/2019   Torsades de pointes (HCC)    Ventral hernia      Past Surgical History:  Procedure Laterality Date   CATARACT EXTRACTION Bilateral 2019   INGUINAL HERNIA REPAIR Bilateral    LEFT HEART CATH AND CORONARY ANGIOGRAPHY N/A 09/20/2016   Procedure: LEFT HEART CATH AND CORONARY ANGIOGRAPHY;  Surgeon: Belva Crome, MD;  Location: [place] [facility];  Service: Cardiovascular;  Laterality: N/A;   PARTIAL NEPHRECTOMY Left [facility]   TONSILLECTOMY  [id number]   UMBILICAL HERNIA REPAIR  02/2017     Medications Prior to Admission: Prior to Admission medications   Medication Sig Start Date End Date Taking? Authorizing Provider  albuterol (PROVENTIL) (2.5 MG/3ML) 0.083% nebulizer solution Take 2.5 mg by nebulization every 4 (four) hours as needed for shortness of breath. 08/18/16   [provider]  albuterol (VENTOLIN HFA) 108 (90 Base) MCG/ACT inhaler Inhale 2 puffs into the lungs every 6 (six) hours as needed for wheezing or shortness of breath.    [provider]  ALPRAZolam Duanne Moron) 0.25 MG tablet Take 0.25 mg by mouth 2 (two) times daily as needed for anxiety or sleep. for anxiety 07/19/17   [provider]  apixaban (ELIQUIS) 5 MG TABS tablet Take 5 mg by mouth 2 (two) times daily.    [provider]  aspirin EC 81 MG tablet Take 81 mg by mouth daily. Swallow whole. Patient not taking: Reported on 09/09/2020    [provider]  atorvastatin (LIPITOR) 80 MG tablet Take 80 mg by mouth daily.    [provider]  budesonide-formoterol (SYMBICORT) 160-4.5 MCG/ACT inhaler Inhale 2 puffs into the lungs 2 (two) times daily.    [provider]  clopidogrel (PLAVIX) 75 MG tablet Take 1 tablet (75 mg total) by mouth daily. 08/22/20   Park Liter, MD  donepezil (ARICEPT) 5 MG tablet Take 5 mg by mouth at bedtime. 08/24/19   [provider]  Esomeprazole Magnesium (NEXIUM 24HR PO) Take 20 mg by mouth daily.    [provider]  isosorbide  mononitrate (IMDUR) 30 MG 24 hr tablet Take 30 mg by mouth 2 (two) times daily.    [provider]  losartan (COZAAR) 50 MG tablet Take 50 mg by mouth daily.    [provider]  memantine (NAMENDA) 10 MG tablet Take 10 mg by mouth 2 (two) times daily. 09/24/19   [provider]  metoprolol succinate (TOPROL-XL) 25 MG 24 hr tablet Take 1 tablet (25 mg total) by mouth daily. Take with or immediately following a meal. 08/22/20   Park Liter, MD  nitroGLYCERIN (NITROSTAT) 0.4 MG SL tablet Place 0.4 mg under the tongue every 5 (five) minutes as needed for chest pain.    [provider]  pantoprazole (PROTONIX) 40 MG tablet Take 40 mg by mouth 2 (two) times daily.    [provider]  polyethylene glycol (MIRALAX / GLYCOLAX) 17 g packet Take 17 g by mouth 2 (two) times daily.    [provider]  sacubitril-valsartan Delene Loll)  24-26 MG Take 1 tablet by mouth 2 (two) times daily. 09/09/20   Park Liter, MD     Allergies:    Allergies  Allergen Reactions   Ace Inhibitors Cough    Social History:   Social History   Socioeconomic History   Marital status: Married    Spouse name: Not on file   Number of children: Not on file   Years of education: Not on file   Highest education level: Not on file  Occupational History   Not on file  Tobacco Use   Smoking status: Never   Smokeless tobacco: Never  Vaping Use   Vaping Use: Never used  Substance and Sexual Activity   Alcohol use: No   Drug use: No   Sexual activity: Not on file  Other Topics Concern   Not on file  Social History Narrative   Not on file   Social Determinants of Health   Financial Resource Strain: Not on file  Food Insecurity: Not on file  Transportation Needs: Not on file  Physical Activity: Not on file  Stress: Not on file  Social Connections: Not on file  Intimate Partner Violence: Not on file    Family History:   The patient's family history  includes Breast cancer in his mother; Heart failure in his father; Hypertension in his father; Leukemia in his sister; Prostate cancer in his father; Tuberculosis in his father.    ROS:  Please see the history of present illness.  All other ROS reviewed and negative.     Physical Exam/Data:   Vitals:   09/Boyd/22 1541 09/Boyd/22 1607 09/Boyd/22 1630 09/Boyd/22 1700  BP: (!) 155/98  134/87 (!) 160/95  Pulse: 73     Resp: '18  19 14  '$ Temp: 98.3 F (36.8 C)     TempSrc: Oral     SpO2: 97%     Weight:  74.8 kg    Height:  '5\' 8"'$  (1.727 m)     No intake or output data in the 24 hours ending 09/Boyd/22 1723 Last 3 Weights 09/27/2020 09/09/2020 08/25/2020  Weight (lbs) 165 lb 160 lb 3.2 oz 159 lb  Weight (kg) 74.844 kg 72.666 kg 72.122 kg     Body mass index is 25.09 kg/m.  General:  Well nourished, well developed, in no acute distress HEENT: normal Lymph: no adenopathy Neck: no JVD Endocrine:  No thryomegaly Vascular: No carotid bruits; FA pulses 2+ bilaterally without bruits  Cardiac:  irregularly irregular; no murmur  Lungs:  clear to auscultation bilaterally, no wheezing, rhonchi or rales  Abd: soft, nontender, no hepatomegaly  Ext: no edema Musculoskeletal:  No deformities, BUE and BLE strength normal and equal Skin: warm and dry  Neuro:  CNs 2-12 intact, no focal abnormalities noted Psych:  Normal affect    EKG:  The ECG that was done and was personally reviewed and demonstrates atrial fibrillation, rate controlled, ST elevation in the anterior leads with Q waves.  Relevant CV Studies:  Echo 08/21/2020 1. Severe hypokinesis of the posterior wall. The mid and distal inferior  segments are severely hypokinetic as well. Left ventricular ejection  fraction, by estimation, is 30 to 35%. Left ventricular ejection fraction  by 3D volume is 32 %. The left  ventricle has moderately decreased function. The left ventricle  demonstrates global hypokinesis. There is moderate concentric left   ventricular hypertrophy. Left ventricular diastolic function could not be  evaluated.   2. Right ventricular systolic function is normal.  The right ventricular  size is normal. There is normal pulmonary artery systolic pressure. The  estimated right ventricular systolic pressure is 123456 mmHg.   3. The mitral valve is grossly normal. Mild mitral valve regurgitation.  No evidence of mitral stenosis.   4. The aortic valve is tricuspid. Aortic valve regurgitation is mild. No  aortic stenosis is present.   5. The inferior vena cava is normal in size with greater than 50%  respiratory variability, suggesting right atrial pressure of 3 mmHg.   Comparison(s): Changes from prior study are noted. EF is now 30-35%. WMAs  noted above.   Laboratory Data:  High Sensitivity Troponin:  No results for input(s): TROPONINIHS in the last 720 hours.    ChemistryNo results for input(s): NA, K, CL, CO2, GLUCOSE, BUN, CREATININE, CALCIUM, GFRNONAA, GFRAA, ANIONGAP in the last 168 hours.  No results for input(s): PROT, ALBUMIN, AST, ALT, ALKPHOS, BILITOT in the last 168 hours. Hematology Recent Labs  Lab 09/Boyd/22 1611  WBC 8.7  RBC 4.52  HGB 14.8  HCT 44.9  MCV 99.3  MCH 32.7  MCHC 33.0  RDW 13.5  PLT 270   BNPNo results for input(s): BNP, PROBNP in the last 168 hours.  DDimer No results for input(s): DDIMER in the last 168 hours.   Radiology/Studies:  DG Chest Port 1 View  Result Date: 09/27/2020 CLINICAL DATA:  Chest pain EXAM: PORTABLE CHEST 1 VIEW COMPARISON:  Radiograph 02/06/2020, chest CT 09/26/2006 FINDINGS: Unchanged, enlarged cardiac silhouette. There is no new focal airspace disease. There is no large pleural effusion or visible pneumothorax. Gas distended large hiatal hernia. Bilateral shoulder degenerative changes. There is no acute osseous abnormality. IMPRESSION: Unchanged, enlarged cardiac silhouette. No new focal airspace disease. Gas distended large hiatal hernia. Electronically  Signed   By: Maurine Simmering M.D.   On: 09/Boyd/2022 16:33     Assessment and Plan:   Likely STEMI with delayed arrival:  -Onset of chest pain immediately prior to noon today, chest pain lasted about 2 to 3 hours before going away.  Patient has been chest pain-free for at least 2 hours by the time of interview.  Unfortunately, he is currently on triple therapy with aspirin, Plavix and Eliquis.  Last dose of Eliquis according to the patient was this morning. -EKG showed atrial fibrillation with ST elevation in the anterior leads concerning for STEMI.  He also shows Q waves in anterior leads as well.  Patient likely had MI with delayed arrival. -Given lack of chest pain at the time of interview, presence of Q waves in anterior lead and last dose of Eliquis this morning, code STEMI is currently canceled.  Patient will be admitted to cardiology service.  Continue serial troponin.  Lab works are currently pending.  Obtain echocardiogram. -Potentially set up for cardiac catheterization for next Tue depend his symptom, will defer to MD regarding timing of cath.  CAD: previous PCI of LAD and LCx in 2011, nonobstructive disease on cath in 2018.  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 EF improved to 45-50% by 2021. EF dropped down to 35% recently during acute CVA  Hyperlipidemia  Dementia  Permanent atrial fibrillation: on Eliquis. Rate controlled.   Recent acute CVA July 2022: on triple therapy. ASA, plavix and Eliquis.    Risk Assessment/Risk Scores:    TIMI Risk Score for ST  Elevation MI:   The patient's TIMI risk score  is 5, which indicates a 12.4% risk of all cause mortality at 30 days.     CHA2DS2-VASc Score = 7   This indicates a 11.2% annual risk of stroke. The patient's score is based upon: CHF History: Yes HTN History: Yes Diabetes History: No Stroke History: Yes Vascular Disease History:  Yes Age Score: 2 Gender Score: 0     Severity of Illness: The appropriate patient status for this patient is INPATIENT. Inpatient status is judged to be reasonable and necessary in order to provide the required intensity of service to ensure the patient's safety. The patient's presenting symptoms, physical exam findings, and initial radiographic and laboratory data in the context of their chronic comorbidities is felt to place them at high risk for further clinical deterioration. Furthermore, it is not anticipated that the patient will be medically stable for discharge from the hospital within 2 midnights of admission. The following factors support the patient status of inpatient.   " The patient's presenting symptoms include chest pain. " The worrisome physical exam findings include benign. " The initial radiographic and laboratory data are worrisome because of ST elevation on the anterior leads. " The chronic co-morbidities include CAD, permanent atrial fibrillation.   * I certify that at the point of admission it is my clinical judgment that the patient will require inpatient hospital care spanning beyond 2 midnights from the point of admission due to high intensity of service, high risk for further deterioration and high frequency of surveillance required.*   For questions or updates, please contact [place] Please consult [email or web address] for contact info under     Lonnie Boyd, Lonnie Boyd  09/27/2020 5:23 PM    Patient seen and examined. Agree with assessment and plan.  Lonnie Boyd is an 80 year old gentleman who is followed by Dr. Agustin Cree for his cardiology care.  He has known CAD and has undergone prior stenting to his LAD and circumflex in 2011.  In August 2018 he suffered a VT VF arrest and had stress-induced cardiomyopathy.  At catheterization in 2018, there was total occlusion of a diagonal vessel with a patent proximal to mid LAD stent with 40 to 50% in-stent narrowing,  patent circumflex stent with 60 to 70% stenosis externally and distal, and mild nonobstructive disease in his RCA.  EF had reduced to 20 to 25% and subsequently improved to 45 to 50% in September 2018.  In July 2022 he suffered a CVA and carotid imaging showed moderate ICA stenoses.  MRI revealed scattered right MCA infarcts.  His last echo Doppler study in July 2022 showed EF at 30 to 35%.  Following his stroke he was discharged on triple therapy including aspirin/Plavix and Eliquis.  Today around noon when going to the bathroom he developed episodes sewed of chest pressure.  This episode lasted For 2 to less than 3 hours and he has been pain-free for approximately 3 hours.  He ultimately presented to the emergency room.  His ECG now showed anterolateral Q waves V1 through V4 with slightly more pronounced ST elevation and also inferior Q waves raising concern for prior inferior as well as anterior MI.  With his anterolateral ST elevation, a code STEMI was activated.  I immediately went down and saw the patient in the emergency room.  At that time he was completely pain-free.  He had taken his dose of Eliquis this morning in addition to his aspirin and Plavix.  With his resolution of chest pain, and last dose of Eliquis this  morning, I canceled the cath team activation.  On physical exam he is in no acute distress.  Blood pressure was elevated at 160/95.  Pulse in the 70s.  HEENT is unremarkable.  There is a no JVD.  Lungs reveal slightly decreased breath sounds at the bases.  Rhythm is irregularly irregular insistent with atrial fibrillation.  Abdomen is soft nontender.  There is no hepatojugular reflux.  Pulses were adequate.  There was no significant edema.  He did not have any residual hemiparesis.  With the patient pain-free, I will not take acutely to the catheterization laboratory particularly since his last dose of Eliquis was this morning.  Plan to obtain serial troponins, D echo Doppler study, initiate  heparin once pharmacy commands initiation, possibly tomorrow since he had taken Eliquis today.  Will initiate intravenous nitroglycerin particularly with blood pressure elevation and ST abnormality.  If LV function remains low, consider transition to [facility] pending upon renal function.  Most likely need repeat coronary angiography off Eliquis this week.    Troy Sine, MD, [facility] 09/27/2020 5:31 PM

## 2020-09-27 NOTE — Progress Notes (Signed)
   09/27/20 1625  Clinical Encounter Type  Visited With Health care provider  Visit Type Initial;ED   Chaplain responded to a code STEMI. Chaplain checked in with patient's nurse, who indicated no needs at this time. STEMI has been cancelled.Spiritual care services available as needed.   Jeri Lager, Chaplain

## 2020-09-28 ENCOUNTER — Inpatient Hospital Stay (HOSPITAL_COMMUNITY): Payer: PPO

## 2020-09-28 ENCOUNTER — Other Ambulatory Visit: Payer: Self-pay

## 2020-09-28 DIAGNOSIS — R079 Chest pain, unspecified: Secondary | ICD-10-CM

## 2020-09-28 LAB — BASIC METABOLIC PANEL
Anion gap: 7 (ref 5–15)
BUN: 25 mg/dL — ABNORMAL HIGH (ref 8–23)
CO2: 25 mmol/L (ref 22–32)
Calcium: 8.7 mg/dL — ABNORMAL LOW (ref 8.9–10.3)
Chloride: 105 mmol/L (ref 98–111)
Creatinine, Ser: 1.04 mg/dL (ref 0.61–1.24)
GFR, Estimated: >60 mL/min (ref >60)
Glucose, Bld: 109 mg/dL — ABNORMAL HIGH (ref 70–99)
Potassium: 4.4 mmol/L (ref 3.5–5.1)
Sodium: 137 mmol/L (ref 135–145)

## 2020-09-28 LAB — ECHOCARDIOGRAM COMPLETE
AR max vel: 1.95 cm2
AV Area VTI: 1.93 cm2
AV Area mean vel: 1.86 cm2
AV Mean grad: 3 mmHg
AV Peak grad: 6.2 mmHg
Ao pk vel: 1.24 m/s
Calc EF: 41.4 %
Height: 68 in
P 1/2 time: 834 msec
S' Lateral: 3.8 cm
Single Plane A2C EF: 42.7 %
Single Plane A4C EF: 38.2 %
Weight: 2640 oz

## 2020-09-28 LAB — CBC
HCT: 43.9 % (ref 39.0–52.0)
Hemoglobin: 15 g/dL (ref 13.0–17.0)
MCH: 32.9 pg (ref 26.0–34.0)
MCHC: 34.2 g/dL (ref 30.0–36.0)
MCV: 96.3 fL (ref 80.0–100.0)
Platelets: 250 10*3/uL (ref 150–400)
RBC: 4.56 MIL/uL (ref 4.22–5.81)
RDW: 13.4 % (ref 11.5–15.5)
WBC: 9.9 10*3/uL (ref 4.0–10.5)
nRBC: 0 % (ref 0.0–0.2)

## 2020-09-28 LAB — BRAIN NATRIURETIC PEPTIDE: B Natriuretic Peptide: 558.2 pg/mL — ABNORMAL HIGH (ref 0.0–100.0)

## 2020-09-28 LAB — LIPID PANEL
Cholesterol: 193 mg/dL (ref 0–200)
HDL: 32 mg/dL — ABNORMAL LOW (ref >40)
LDL Cholesterol: 143 mg/dL — ABNORMAL HIGH (ref 0–99)
Total CHOL/HDL Ratio: 6 RATIO
Triglycerides: 88 mg/dL (ref <150)
VLDL: 18 mg/dL (ref 0–40)

## 2020-09-28 LAB — HEPARIN LEVEL (UNFRACTIONATED)
Heparin Unfractionated: 0.13 IU/mL — ABNORMAL LOW (ref 0.30–0.70)
Heparin Unfractionated: 0.35 IU/mL (ref 0.30–0.70)

## 2020-09-28 LAB — APTT
aPTT: 31 seconds (ref 24–36)
aPTT: 60 seconds — ABNORMAL HIGH (ref 24–36)

## 2020-09-28 MED ORDER — SODIUM ZIRCONIUM CYCLOSILICATE 10 G PO PACK: 10.0000 g | PACK | Freq: Two times a day (BID) | ORAL | Status: DC

## 2020-09-28 NOTE — Progress Notes (Signed)
ANTICOAGULATION CONSULT NOTE  Pharmacy Consult for heparin Indication: chest pain/ACS  Allergies  Allergen Reactions   Ace Inhibitors Cough    Patient Measurements: Height: '5\' 8"'$  (172.7 cm) Weight: 74.8 kg (165 lb) IBW/kg (Calculated) : 68.4 Heparin Dosing Weight: 74.8 kg  Vital Signs: Temp: 97.6 F (36.4 C) (09/04 1112) Temp Source: Oral (09/04 1112) BP: 114/64 (09/04 1112) Pulse Rate: 63 (09/04 1112)  Labs: Recent Labs    09/27/20 1611 09/27/20 1808 09/28/20 0502 09/28/20 1357  HGB 14.8  --  15.0  --   HCT 44.9  --  43.9  --   PLT 270  --  250  --   APTT 31  --  31 60*  LABPROT 14.9  --   --   --   INR 1.2  --   --   --   HEPARINUNFRC  --   --  0.13* 0.35  CREATININE 1.24  --  1.04  --   TROPONINIHS 5,942* >24,000*  --   --      Estimated Creatinine Clearance: 53 mL/min (by C-G formula based on SCr of 1.04 mg/dL).  Assessment:  80 yo M presented with chest pain and found to have STEMI on EKG with delayed arrival. Of note, patient has history of AF on PTA Eliquis (last dose 9/3 AM). Code STEMI was cancelled and patient was admitted for Eliquis washout before intervention. Pharmacy consulted for heparin dosing for ACS.  Patient has been free of chest pain since arrival 9/3, cards felt there was no urgency for cath (infarct likely completed) and has been scheduled for Tuesday (9/6).   Heparin level 0.35 (affected by Eliquis), aPTT 60 sec (subtherapeutic) on gtt at 1150 units/hr. CBC stable. No issues with line or bleeding reported per RN.  Goal of Therapy:  Heparin level 0.3-0.7 units/ml aPTT 66-102 seconds Monitor platelets by anticoagulation protocol: Yes   Plan:  Increase heparin to 1300 units/hr 8h aPTT/HL until correlating Daily CBC, aPTT, HL Monitor s/sx of bleeding  Laurey Arrow, PharmD PGY1 Pharmacy Resident 09/28/2020  3:12 PM  Please check [email or web address] for unit-specific pharmacy phone numbers.

## 2020-09-28 NOTE — Progress Notes (Signed)
*  PRELIMINARY RESULTS* Echocardiogram 2D Echocardiogram has been performed.  Luisa Hart  RDCS 09/28/2020, 11:29 AM

## 2020-09-28 NOTE — Progress Notes (Addendum)
Progress Note  Patient Name: BABY SAUCEMAN Date of Encounter: 09/28/2020  [place] Cardiologist: Jenne Campus, MD   Subjective   Denies any CP or SOB.   Inpatient Medications    Scheduled Meds:  aspirin EC  81 mg Oral Daily   atorvastatin  80 mg Oral Daily   clopidogrel  75 mg Oral Daily   losartan  50 mg Oral Daily   metoprolol succinate  25 mg Oral Daily   Continuous Infusions:  sodium chloride 20 mL/hr at 09/27/20 1623   heparin 1,150 Units/hr (09/28/20 0621)   nitroGLYCERIN 10 mcg/min (09/28/20 0014)   PRN Meds: acetaminophen, nitroGLYCERIN   Vital Signs    Vitals:   09/27/20 1957 09/27/20 2314 09/28/20 0358 09/28/20 0811  BP: 136/88 (!) 146/88 (!) 136/100 129/89  Pulse: 63 65 74 72  Resp: '13 13 14 13  '$ Temp: 97.8 F (36.6 C) 97.8 F (36.6 C) 97.8 F (36.6 C) 97.8 F (36.6 C)  TempSrc: Oral Oral Oral Oral  SpO2: 98% 99% 99% 97%  Weight:      Height:        Intake/Output Summary (Last 24 hours) at 09/28/2020 0923 Last data filed at 09/28/2020 [id number] Gross per 24 hour  Intake --  Output 600 ml  Net -600 ml   Last 3 Weights 09/27/2020 09/09/2020 08/25/2020  Weight (lbs) 165 lb 160 lb 3.2 oz 159 lb  Weight (kg) 74.844 kg 72.666 kg 72.122 kg      Telemetry    Atrial fibrillation without single NSVT - Personally Reviewed  ECG    Atrial fibrillation with Q wave in the inferior and anterior leads with deep TWI in the anterior leads. Previous ST elevation in the precordial leads has improved, T wave inversion is more prominant. - Personally Reviewed  Physical Exam   GEN: No acute distress.   Neck: No JVD Cardiac: irregularly irregular, no murmurs, rubs, or gallops.  Respiratory: Clear to auscultation bilaterally. GI: Soft, nontender, non-distended  MS: No edema; No deformity. Neuro:  Nonfocal  Psych: Normal affect   Labs    High Sensitivity Troponin:   Recent Labs  Lab 09/27/20 1611 09/27/20 1808  TROPONINIHS 5,942* >24,000*       Chemistry Recent Labs  Lab 09/27/20 1611 09/28/20 0502  NA 137 137  K 4.1 4.4  CL 104 105  CO2 25 25  GLUCOSE 113* 109*  BUN 29* 25*  CREATININE 1.24 1.04  CALCIUM 9.0 8.7*  PROT 6.6  --   ALBUMIN 3.6  --   AST 57*  --   ALT 26  --   ALKPHOS 77  --   BILITOT 1.3*  --   GFRNONAA 58* >60  ANIONGAP 8 7     Hematology Recent Labs  Lab 09/27/20 1611 09/28/20 0502  WBC 8.7 9.9  RBC 4.52 4.56  HGB 14.8 15.0  HCT 44.9 43.9  MCV 99.3 96.3  MCH 32.7 32.9  MCHC 33.0 34.2  RDW 13.5 13.4  PLT 270 250    BNP Recent Labs  Lab 09/27/20 1959  BNP 558.2*     DDimer No results for input(s): DDIMER in the last 168 hours.   Radiology    DG Chest Port 1 View  Result Date: 09/27/2020 CLINICAL DATA:  Chest pain EXAM: PORTABLE CHEST 1 VIEW COMPARISON:  Radiograph 02/06/2020, chest CT 09/26/2006 FINDINGS: Unchanged, enlarged cardiac silhouette. There is no new focal airspace disease. There is no large pleural effusion or visible pneumothorax. Gas  distended large hiatal hernia. Bilateral shoulder degenerative changes. There is no acute osseous abnormality. IMPRESSION: Unchanged, enlarged cardiac silhouette. No new focal airspace disease. Gas distended large hiatal hernia. Electronically Signed   By: Maurine Simmering M.D.   On: 09/27/2020 16:33    Cardiac Studies   Echo 08/21/2020 1. Severe hypokinesis of the posterior wall. The mid and distal inferior  segments are severely hypokinetic as well. Left ventricular ejection  fraction, by estimation, is 30 to 35%. Left ventricular ejection fraction  by 3D volume is 32 %. The left  ventricle has moderately decreased function. The left ventricle  demonstrates global hypokinesis. There is moderate concentric left  ventricular hypertrophy. Left ventricular diastolic function could not be  evaluated.   2. Right ventricular systolic function is normal. The right ventricular  size is normal. There is normal pulmonary artery systolic pressure.  The  estimated right ventricular systolic pressure is 123456 mmHg.   3. The mitral valve is grossly normal. Mild mitral valve regurgitation.  No evidence of mitral stenosis.   4. The aortic valve is tricuspid. Aortic valve regurgitation is mild. No  aortic stenosis is present.   5. The inferior vena cava is normal in size with greater than 50%  respiratory variability, suggesting right atrial pressure of 3 mmHg.   Comparison(s): Changes from prior study are noted. EF is now 30-35%. WMAs  noted above.   Patient Profile     80 y.o. 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 Patient says he took the last dose of Eliquis in the morning of arrival. By the time of cardiology interview, he had been chest pain free for 2 hours. Decision was made to cancel code STEMI and admit the patient to hospital while waiting for Eliquis washout.    Assessment & Plan    Late presenting anterior STEMI  - patient was taken to the cath lab on arrival since he had taken last dose of Eliquis that morning. He was chest pain free for a minimum of 2 hours prior to cardiology initial consultation  - EKG initially showed ST elevation in the anterior leads, overnight, ST elevation improved, now showing evidence of completed infarct with Q waves and TWI. Patient also had old Q wave from inferior lead likely suggestive of prior inferior infarct as well.   - serial trop > 24000  - although Dr. Evette Georges note initially considered cath today, however since patient has been chest pain free since arrival yesterday, Dr. Claiborne Billings felt there is no absolute urgency for cath this weekend as patient likely already completed infarct. Patient appears to be stable overnight.   - Pending echocardiogram. Continue aspirin and plavix.  Continue IV heparin and IV nitroglycerine  CAD: previous PCI of LAD and LCx in 2011, nonobstructive disease on cath in 2018.  - previous echo in July 2022 suggestive of hypokinesis of posterior, mid and distal inferior segment. Given inferior Q waves, suspected old inferior infarct. Now presented with delayed presenting anterior infarct.   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 EF improved to 45-50% by 2021. EF dropped down to 35% recently during acute CVA  Hyperlipidemia  Dementia  Permanent atrial fibrillation: on Eliquis. Rate controlled  Recent acute CVA July 2022:  on plavix and Eliquis  For questions or updates, please contact [place] Please consult [email or web address] for contact info under        Signed, Almyra Deforest, [place]  09/28/2020, 9:23 AM    I have seen and examined this patient with Almyra Deforest.  Agree with above, note added to reflect my findings.  On exam, regular rhythm, no murmurs.  Patient presented with a late STEMI.  Patient was chest pain-free upon arrival to the emergency room.  He had received Eliquis the morning of presentation.  He had Q waves on his ECG.  No catheterization was performed yesterday.  We Shaton Lore plan for left heart catheterization on Tuesday.  We Eathan Groman continue IV heparin and nitroglycerin as the patient is currently chest pain-free.  TTE currently pending.  Mahlet Jergens M. Jerie Basford MD 09/28/2020 9:52 AM

## 2020-09-28 NOTE — Progress Notes (Signed)
ANTICOAGULATION CONSULT NOTE  Pharmacy Consult for heparin Indication: chest pain/ACS  Allergies  Allergen Reactions   Ace Inhibitors Cough    Patient Measurements: Height: '5\' 8"'$  (172.7 cm) Weight: 74.8 kg (165 lb) IBW/kg (Calculated) : 68.4 Heparin Dosing Weight: 74.8 kg  Vital Signs: Temp: 97.8 F (36.6 C) (09/04 0358) Temp Source: Oral (09/04 0358) BP: 136/100 (09/04 0358) Pulse Rate: 74 (09/04 0358)  Labs: Recent Labs    09/27/20 1611 09/27/20 1808 09/28/20 0502  HGB 14.8  --  15.0  HCT 44.9  --  43.9  PLT 270  --  250  APTT 31  --  31  LABPROT 14.9  --   --   INR 1.2  --   --   HEPARINUNFRC  --   --  0.13*  CREATININE 1.24  --   --   TROPONINIHS 5,942* >24,000*  --      Estimated Creatinine Clearance: 44.4 mL/min (by C-G formula based on SCr of 1.24 mg/dL).  Assessment: 80 yo M with CAD, cardiomyopathy, HLD, dementia, pAF (PTA eliquis, last dose 9/3 AM), recent CVA in July 2022, with CP. Was called as Code STEMI but cancelled, now heparin consult for ACS.   Heparin level 0.13 (affected by Eliquis), aPTT 31 sec (subtherapeutic) on gtt at 900 units/hr. No issues with line or bleeding reported per RN.  Goal of Therapy:  Heparin level 0.3-0.7 units/ml aPTT 66-102 seconds Monitor platelets by anticoagulation protocol: Yes   Plan:  Increase heparin to 1150 units/hr F/u 8h aPTT/HL  Sherlon Handing, PharmD, BCPS Please see amion for complete clinical pharmacist phone list 09/28/2020 6:04 AM

## 2020-09-29 ENCOUNTER — Encounter (HOSPITAL_COMMUNITY): Payer: Self-pay | Admitting: Cardiovascular Disease

## 2020-09-29 DIAGNOSIS — I5041 Acute combined systolic (congestive) and diastolic (congestive) heart failure: Secondary | ICD-10-CM

## 2020-09-29 DIAGNOSIS — I10 Essential (primary) hypertension: Secondary | ICD-10-CM

## 2020-09-29 DIAGNOSIS — E7800 Pure hypercholesterolemia, unspecified: Secondary | ICD-10-CM

## 2020-09-29 LAB — APTT: aPTT: 105 seconds — ABNORMAL HIGH (ref 24–36)

## 2020-09-29 LAB — HEPARIN LEVEL (UNFRACTIONATED)
Heparin Unfractionated: 0.62 IU/mL (ref 0.30–0.70)
Heparin Unfractionated: 0.82 IU/mL — ABNORMAL HIGH (ref 0.30–0.70)
Heparin Unfractionated: 0.97 IU/mL — ABNORMAL HIGH (ref 0.30–0.70)
Heparin Unfractionated: 0.65 IU/mL (ref 0.30–0.70)

## 2020-09-29 LAB — CBC
HCT: 44.6 % (ref 39.0–52.0)
Hemoglobin: 15.2 g/dL (ref 13.0–17.0)
MCH: 33.2 pg (ref 26.0–34.0)
MCHC: 34.1 g/dL (ref 30.0–36.0)
MCV: 97.4 fL (ref 80.0–100.0)
Platelets: 241 10*3/uL (ref 150–400)
RBC: 4.58 MIL/uL (ref 4.22–5.81)
RDW: 13.8 % (ref 11.5–15.5)
WBC: 10.1 10*3/uL (ref 4.0–10.5)
nRBC: 0 % (ref 0.0–0.2)

## 2020-09-29 MED ORDER — SPIRONOLACTONE 25 MG PO TABS
25.0000 mg | ORAL_TABLET | Freq: Every day | ORAL | Status: DC
  Administered 2020-10-01: 25 mg via ORAL
  Filled 2020-09-29: qty 1

## 2020-09-29 MED ORDER — EMPAGLIFLOZIN 10 MG PO TABS
10.0000 mg | ORAL_TABLET | Freq: Every day | ORAL | Status: DC
  Administered 2020-10-01: 10 mg via ORAL
  Filled 2020-09-29: qty 1

## 2020-09-29 MED ORDER — SACUBITRIL-VALSARTAN 24-26 MG PO TABS
1.0000 | ORAL_TABLET | Freq: Two times a day (BID) | ORAL | Status: DC
  Administered 2020-09-29 – 2020-10-01 (×4): 1 via ORAL
  Filled 2020-09-29 (×4): qty 1

## 2020-09-29 NOTE — Progress Notes (Signed)
ANTICOAGULATION CONSULT NOTE - Follow Up Consult  Pharmacy Consult for heparin Indication:  STEMI  Labs: Recent Labs    09/27/20 1611 09/27/20 1808 09/28/20 0502 09/28/20 1357 09/29/20 0031 09/29/20 0039  HGB 14.8  --  15.0  --  15.2  --   HCT 44.9  --  43.9  --  44.6  --   PLT 270  --  250  --  241  --   APTT 31  --  31 60*  --  105*  LABPROT 14.9  --   --   --   --   --   INR 1.2  --   --   --   --   --   HEPARINUNFRC  --   --  0.13* 0.35  --  0.62  CREATININE 1.24  --  1.04  --   --   --   TROPONINIHS 5,942* >24,000*  --   --   --   --     Assessment/Plan:  80yo male therapeutic on heparin after rate change, PTT and anti-Xa levels now correlating. Will continue infusion at current rate of 1300 units/hr and confirm stable with additional level.   Wynona Neat, PharmD, BCPS  09/29/2020,2:02 AM

## 2020-09-29 NOTE — Progress Notes (Signed)
VAST consult received. Patient up in chair and on his way to the bathroom. Instructed nurse to re-consult when patient is back to bed. VU. Fran Lowes, RN VAST

## 2020-09-29 NOTE — Progress Notes (Signed)
Progress Note  Patient Name: Lonnie Boyd Date of Encounter: 09/29/2020  [facility] Cardiologist: Jenne Campus, MD   Subjective   Feeling well. Denies chest pain or shortness of breath.  No palpitations.   Inpatient Medications    Scheduled Meds:  aspirin EC  81 mg Oral Daily   atorvastatin  80 mg Oral Daily   clopidogrel  75 mg Oral Daily   losartan  50 mg Oral Daily   metoprolol succinate  25 mg Oral Daily   Continuous Infusions:  sodium chloride 20 mL/hr at 09/27/20 1623   heparin 1,200 Units/hr (09/29/20 0838)   nitroGLYCERIN 10 mcg/min (09/29/20 0200)   PRN Meds: acetaminophen, nitroGLYCERIN   Vital Signs    Vitals:   09/29/20 0357 09/29/20 0450 09/29/20 0755 09/29/20 0827  BP: (!) 165/119 (!) 157/101 (!) 150/106   Pulse: 68   83  Resp: '18 16 16   '$ Temp: 98.2 F (36.8 C)  97.8 F (36.6 C)   TempSrc: Oral  Oral   SpO2: 98%  97%   Weight:      Height:        Intake/Output Summary (Last 24 hours) at 09/29/2020 0937 Last data filed at 09/29/2020 0200 Gross per 24 hour  Intake 459.42 ml  Output 600 ml  Net -140.58 ml   Last 3 Weights 09/27/2020 09/09/2020 08/25/2020  Weight (lbs) 165 lb 160 lb 3.2 oz 159 lb  Weight (kg) 74.844 kg 72.666 kg 72.122 kg      Telemetry    Atrial fibrillation.  Rate <100 bpm.  10 beats VT vs afib with aberrancy - Personally Reviewed  ECG    Atrial fibrillation.  Rate 50 bpm.  Left axis deviation.  Anteroseptal infarct with marked T wave inversions.  QTC 503 ms- Personally Reviewed  Physical Exam   GEN: No acute distress.   Neck: No JVD Cardiac: Irregularly irregular.  No murmurs, rubs, or gallops.  Respiratory: Clear to auscultation bilaterally. GI: Soft, nontender, non-distended  MS: No edema; No deformity. Neuro:  Nonfocal  Psych: Normal affect   Labs    High Sensitivity Troponin:   Recent Labs  Lab 09/27/20 1611 09/27/20 1808  TROPONINIHS 5,942* >24,000*      Chemistry Recent Labs  Lab  09/27/20 1611 09/28/20 0502  NA 137 137  K 4.1 4.4  CL 104 105  CO2 25 25  GLUCOSE 113* 109*  BUN 29* 25*  CREATININE 1.24 1.04  CALCIUM 9.0 8.7*  PROT 6.6  --   ALBUMIN 3.6  --   AST 57*  --   ALT 26  --   ALKPHOS 77  --   BILITOT 1.3*  --   GFRNONAA 58* >60  ANIONGAP 8 7     Hematology Recent Labs  Lab 09/27/20 1611 09/28/20 0502 09/29/20 0031  WBC 8.7 9.9 10.1  RBC 4.52 4.56 4.58  HGB 14.8 15.0 15.2  HCT 44.9 43.9 44.6  MCV 99.3 96.3 97.4  MCH 32.7 32.9 33.2  MCHC 33.0 34.2 34.1  RDW 13.5 13.4 13.8  PLT 270 250 241    BNP Recent Labs  Lab 09/27/20 1959  BNP 558.2*     DDimer No results for input(s): DDIMER in the last 168 hours.   Radiology    DG Chest Port 1 View  Result Date: 09/27/2020 CLINICAL DATA:  Chest pain EXAM: PORTABLE CHEST 1 VIEW COMPARISON:  Radiograph 02/06/2020, chest CT 09/26/2006 FINDINGS: Unchanged, enlarged cardiac silhouette. There is no new focal  airspace disease. There is no large pleural effusion or visible pneumothorax. Gas distended large hiatal hernia. Bilateral shoulder degenerative changes. There is no acute osseous abnormality. IMPRESSION: Unchanged, enlarged cardiac silhouette. No new focal airspace disease. Gas distended large hiatal hernia. Electronically Signed   By: Maurine Simmering M.D.   On: 09/27/2020 16:33   ECHOCARDIOGRAM COMPLETE  Result Date: 09/28/2020    ECHOCARDIOGRAM REPORT   Patient Name:   Lonnie Boyd Date of Exam: 09/28/2020 Medical Rec #:  [id number]       Height:       68.0 in Accession #:    [id number]      Weight:       165.0 lb Date of Birth:  [birth year]        BSA:          1.883 m Patient Age:    80 years        BP:           129/89 mmHg Patient Gender: M               HR:           59 bpm. Exam Location:  Inpatient Procedure: 2D Echo, Cardiac Doppler and Color Doppler                                 MODIFIED REPORT:  This report was modified by Cherlynn Kaiser MD on 09/28/2020 due to measurement                                     revision.  Indications:     Chest pain  History:         Patient has prior history of Echocardiogram examinations, most                  recent 08/21/2020. Cardiomyopathy, CAD and Previous Myocardial                  Infarction, COPD; Arrythmias:Atrial Fibrillation. 09/20/2016                  Left heart cath.  Sonographer:     Fernando Salinas Referring Phys:  [id number] HAO MENG Diagnosing Phys: Cherlynn Kaiser MD IMPRESSIONS  1. Left ventricular ejection fraction, by estimation, is 30 to 35%. The left ventricle has moderately decreased function. The left ventricle demonstrates regional wall motion abnormalities (see scoring diagram/findings for description). There is mild left ventricular hypertrophy. Left ventricular diastolic parameters are indeterminate.  2. Right ventricular systolic function is normal. The right ventricular size is normal. There is normal pulmonary artery systolic pressure. The estimated right ventricular systolic pressure is 0000000 mmHg.  3. Left atrial size was mildly dilated.  4. Right atrial size was mild to moderately dilated.  5. The mitral valve is grossly normal. Trivial mitral valve regurgitation. No evidence of mitral stenosis.  6. The aortic valve is grossly normal. Aortic valve regurgitation is trivial. No aortic stenosis is present.  7. The inferior vena cava is normal in size with greater than 50% respiratory variability, suggesting right atrial pressure of 3 mmHg. FINDINGS  Left Ventricle: Left ventricular ejection fraction, by estimation, is 30 to 35%. The left ventricle has moderately decreased function. The left ventricle demonstrates regional wall motion abnormalities. The left ventricular internal cavity size was normal in  size. There is mild left ventricular hypertrophy. Left ventricular diastolic parameters are indeterminate.  LV Wall Scoring: The mid anterior segment is akinetic. The mid and distal lateral wall, mid and distal anterior septum, mid  inferoseptal segment, apical anterior segment, and apical inferior segment are hypokinetic. Right Ventricle: The right ventricular size is normal. No increase in right ventricular wall thickness. Right ventricular systolic function is normal. There is normal pulmonary artery systolic pressure. The tricuspid regurgitant velocity is 2.16 m/s, and  with an assumed right atrial pressure of 3 mmHg, the estimated right ventricular systolic pressure is 0000000 mmHg. Left Atrium: Left atrial size was mildly dilated. Right Atrium: Right atrial size was mild to moderately dilated. Pericardium: There is no evidence of pericardial effusion. Mitral Valve: The mitral valve is grossly normal. Trivial mitral valve regurgitation. No evidence of mitral valve stenosis. Tricuspid Valve: The tricuspid valve is normal in structure. Tricuspid valve regurgitation is mild . No evidence of tricuspid stenosis. Aortic Valve: The aortic valve is grossly normal. Aortic valve regurgitation is trivial. Aortic regurgitation PHT measures 834 msec. No aortic stenosis is present. Aortic valve mean gradient measures 3.0 mmHg. Aortic valve peak gradient measures 6.2 mmHg. Aortic valve area, by VTI measures 1.93 cm. Pulmonic Valve: The pulmonic valve was normal in structure. Pulmonic valve regurgitation is not visualized. No evidence of pulmonic stenosis. Aorta: The aortic root is normal in size and structure. Venous: The inferior vena cava was not well visualized. The inferior vena cava is normal in size with greater than 50% respiratory variability, suggesting right atrial pressure of 3 mmHg. IAS/Shunts: The interatrial septum was not well visualized.  LEFT VENTRICLE PLAX 2D LVIDd:         5.40 cm      Diastology LVIDs:         3.80 cm      LV e' medial: 3.37 cm/s LV PW:         1.00 cm LV IVS:        1.40 cm LVOT diam:     2.25 cm LV SV:         49 LV SV Index:   26 LVOT Area:     3.98 cm  LV Volumes (MOD) LV vol d, MOD A2C: 124.0 ml LV vol d, MOD A4C:  110.0 ml LV vol s, MOD A2C: 71.0 ml LV vol s, MOD A4C: 68.0 ml LV SV MOD A2C:     53.0 ml LV SV MOD A4C:     110.0 ml LV SV MOD BP:      50.2 ml RIGHT VENTRICLE RV Basal diam:  3.40 cm RV Mid diam:    2.00 cm RV S prime:     8.38 cm/s TAPSE (M-mode): 1.5 cm LEFT ATRIUM             Index       RIGHT ATRIUM           Index LA diam:        3.80 cm 2.02 cm/m  RA Area:     23.40 cm LA Vol (A2C):   46.1 ml 24.48 ml/m RA Volume:   71.70 ml  38.07 ml/m LA Vol (A4C):   80.7 ml 42.85 ml/m LA Biplane Vol: 61.4 ml 32.60 ml/m  AORTIC VALVE                   PULMONIC VALVE AV Area (Vmax):    1.95 cm    PV Vmax:  0.66 m/s AV Area (Vmean):   1.86 cm    PV Vmean:      42.800 cm/s AV Area (VTI):     1.93 cm    PV VTI:        0.133 m AV Vmax:           124.00 cm/s PV Peak grad:  1.7 mmHg AV Vmean:          83.800 cm/s PV Mean grad:  1.0 mmHg AV VTI:            0.251 m AV Peak Grad:      6.2 mmHg AV Mean Grad:      3.0 mmHg LVOT Vmax:         60.90 cm/s LVOT Vmean:        39.200 cm/s LVOT VTI:          0.122 m LVOT/AV VTI ratio: 0.49 AI PHT:            834 msec  AORTA Ao Root diam: 3.60 cm Ao Asc diam:  3.30 cm TRICUSPID VALVE TR Peak grad:   18.7 mmHg TR Vmax:        216.00 cm/s  SHUNTS Systemic VTI:  0.12 m Systemic Diam: 2.25 cm Cherlynn Kaiser MD Electronically signed by Cherlynn Kaiser MD Signature Date/Time: 09/28/2020/11:59:38 AM    Final (Updated)     Cardiac Studies   Echo 08/21/2020 1. Severe hypokinesis of the posterior wall. The mid and distal inferior  segments are severely hypokinetic as well. Left ventricular ejection  fraction, by estimation, is 30 to 35%. Left ventricular ejection fraction  by 3D volume is 32 %. The left  ventricle has moderately decreased function. The left ventricle  demonstrates global hypokinesis. There is moderate concentric left  ventricular hypertrophy. Left ventricular diastolic function could not be  evaluated.   2. Right ventricular systolic function is normal. The  right ventricular  size is normal. There is normal pulmonary artery systolic pressure. The  estimated right ventricular systolic pressure is 123456 mmHg.   3. The mitral valve is grossly normal. Mild mitral valve regurgitation.  No evidence of mitral stenosis.   4. The aortic valve is tricuspid. Aortic valve regurgitation is mild. No  aortic stenosis is present.   5. The inferior vena cava is normal in size with greater than 50%  respiratory variability, suggesting right atrial pressure of 3 mmHg.   Comparison(s): Changes from prior study are noted. EF is now 30-35%. WMAs  noted above.   Patient Profile     80 y.o. male with CAD status post PCI (LAD and left circumflex in 2011) VT/VF arrest [id number], chronic systolic diastolic heart failure, tachycardia, hyperlipidemia, stroke, permanent atrial fibrillation admitted with delayed presentation anterior STEMI.  Assessment & Plan    #Late presentation anterior STEMI: Patient initially presented with anterior STEMI.  By the time he was seen he has been chest pain-free for 2 hours.  Code STEMI was canceled and he was admitted to the hospital to allow his Eliquis to washout.  High-sensitivity troponin was over 24,000.  Echo this admission revealed LVEF 30 to 35% with anterior akinesis.  The mid to distal lateral wall, anteroseptal, inferoseptal, and apical inferior segments were all hypokinetic.  Right ventricular function was normal.  Plan for left heart catheterization tomorrow.  Previously had PCI of the LAD and left circumflex in 2018.  His last cath in 2018 revealed nonobstructive disease.  Continue aspirin and clopidogrel.  Stop nitroglycerin  drip.  #Permanent atrial fibrillation: Eliquis on hold.  He is currently on IV heparin.  Rate is controlled on metoprolol.  #VT/VF arrest 08/2016: Continue metoprolol.  QTc 503 ms, decreased from 546 yesterday.  #Chronic systolic and diastolic heart failure: #Hypertension: LVEF was 25% in 2018.   Nonobstructive disease on cath at that time.  It was thought to be due to stress-induced cardiomyopathy.  LVEF subsequently improved to 45 to 50% but is down to 35% on this admission.  He was switched to [facility] based on his last clinic note but losartan was resumed on admission.  Blood pressure was initially well-controlled but has been elevated in the last 24 hours.  We will switch the losartan back to Entresto 24/26 mg.  Will star spironolactone and Jardiance on 9/7.  #CVA: Recent stroke 07/2020.  # Dementia:  Continue donepezil.  #Hyperlipidemia: Lipids are not at goal.  LDL was 143 on presentation.  Per his clinic note 08/2020, atorvastatin was recently increased.  If his lipids are not under control within the next 2 to 3 months, would add a PCSK9 inhibitor given his stroke and MI history.  For questions or updates, please contact [place] Please consult [email or web address] for contact info under        Signed, Skeet Latch, MD  09/29/2020, 9:37 AM

## 2020-09-29 NOTE — Progress Notes (Signed)
ANTICOAGULATION CONSULT NOTE  Pharmacy Consult for heparin Indication: chest pain/ACS  Allergies  Allergen Reactions   Ace Inhibitors Cough    Patient Measurements: Height: '5\' 8"'$  (172.7 cm) Weight: 74.8 kg (165 lb) IBW/kg (Calculated) : 68.4 Heparin Dosing Weight: 74.8 kg  Vital Signs: Temp: 97.6 F (36.4 C) (09/05 1216) Temp Source: Oral (09/05 1216) BP: 152/101 (09/05 1216) Pulse Rate: 96 (09/05 1216)  Labs: Recent Labs    09/27/20 1611 09/27/20 1611 09/27/20 1808 09/28/20 0502 09/28/20 1357 09/29/20 0031 09/29/20 0039 09/29/20 0626 09/29/20 0823 09/29/20 1612  HGB 14.8  --   --  15.0  --  15.2  --   --   --   --   HCT 44.9  --   --  43.9  --  44.6  --   --   --   --   PLT 270  --   --  250  --  241  --   --   --   --   APTT 31  --   --  31 60*  --  105*  --   --   --   LABPROT 14.9  --   --   --   --   --   --   --   --   --   INR 1.2  --   --   --   --   --   --   --   --   --   HEPARINUNFRC  --    < >  --  0.13* 0.35  --  0.62 0.82* 0.97* 0.65  CREATININE 1.24  --   --  1.04  --   --   --   --   --   --   TROPONINIHS 5,942*  --  >24,000*  --   --   --   --   --   --   --    < > = values in this interval not displayed.     Estimated Creatinine Clearance: 53 mL/min (by C-G formula based on SCr of 1.04 mg/dL).  Assessment:  80 yo M presented with chest pain and found to have STEMI on EKG with delayed arrival. Of note, patient has history of AF on PTA Eliquis (last dose 9/3 AM). Code STEMI was cancelled and patient was admitted for Eliquis washout before intervention. Pharmacy consulted for heparin dosing for ACS.  Patient has been free of chest pain since arrival 9/3, cards felt there was no urgency for cath (infarct likely completed) and has been scheduled for Tuesday (9/6).   aPTT and heparin levels now correlating.  Heparin level 0.65 within goal after heparin drip rate decreased 1200 units/hr. CBC stable. No issues with line or bleeding reported per  RN.  Goal of Therapy:  Heparin level 0.3-0.7 units/ml aPTT 66-102 seconds Monitor platelets by anticoagulation protocol: Yes   Plan:  Continue heparin 1200 units/hr Daily CBC and heparin levels Monitor s/sx of bleeding   Bonnita Nasuti Pharm.D. CPP, BCPS Clinical Pharmacist [phone] 09/29/2020 5:35 PM   Please check [email or web address] for unit-specific pharmacy phone numbers.

## 2020-09-29 NOTE — Progress Notes (Signed)
ANTICOAGULATION CONSULT NOTE  Pharmacy Consult for heparin Indication: chest pain/ACS  Allergies  Allergen Reactions   Ace Inhibitors Cough    Patient Measurements: Height: '5\' 8"'$  (172.7 cm) Weight: 74.8 kg (165 lb) IBW/kg (Calculated) : 68.4 Heparin Dosing Weight: 74.8 kg  Vital Signs: Temp: 97.8 F (36.6 C) (09/05 0755) Temp Source: Oral (09/05 0755) BP: 150/106 (09/05 0755) Pulse Rate: 68 (09/05 0357)  Labs: Recent Labs    09/27/20 1611 09/27/20 1611 09/27/20 1808 09/28/20 0502 09/28/20 1357 09/29/20 0031 09/29/20 0039 09/29/20 0626  HGB 14.8  --   --  15.0  --  15.2  --   --   HCT 44.9  --   --  43.9  --  44.6  --   --   PLT 270  --   --  250  --  241  --   --   APTT 31  --   --  31 60*  --  105*  --   LABPROT 14.9  --   --   --   --   --   --   --   INR 1.2  --   --   --   --   --   --   --   HEPARINUNFRC  --    < >  --  0.13* 0.35  --  0.62 0.82*  CREATININE 1.24  --   --  1.04  --   --   --   --   TROPONINIHS 5,942*  --  >24,000*  --   --   --   --   --    < > = values in this interval not displayed.     Estimated Creatinine Clearance: 53 mL/min (by C-G formula based on SCr of 1.04 mg/dL).  Assessment:  80 yo M presented with chest pain and found to have STEMI on EKG with delayed arrival. Of note, patient has history of AF on PTA Eliquis (last dose 9/3 AM). Code STEMI was cancelled and patient was admitted for Eliquis washout before intervention. Pharmacy consulted for heparin dosing for ACS.  Patient has been free of chest pain since arrival 9/3, cards felt there was no urgency for cath (infarct likely completed) and has been scheduled for Tuesday (9/6).   aPTT and heparin levels now correlating.  Heparin level 0.82 is above goal on 1300 units/hr. CBC stable. No issues with line or bleeding reported per RN.  Goal of Therapy:  Heparin level 0.3-0.7 units/ml aPTT 66-102 seconds Monitor platelets by anticoagulation protocol: Yes   Plan:  Reduce  heparin to 1200 units/hr 8 hour heparin level Daily CBC and heparin levels Monitor s/sx of bleeding  Laurey Arrow, PharmD PGY1 Pharmacy Resident 09/29/2020  8:08 AM  Please check [email or web address] for unit-specific pharmacy phone numbers.

## 2020-09-30 ENCOUNTER — Inpatient Hospital Stay (HOSPITAL_COMMUNITY)
Admission: EM | Disposition: A | Discharge status: Home / Self Care | Payer: Self-pay | Source: Home / Self Care | Attending: Cardiovascular Disease

## 2020-09-30 ENCOUNTER — Other Ambulatory Visit (HOSPITAL_COMMUNITY): Payer: Self-pay

## 2020-09-30 ENCOUNTER — Encounter: Payer: Self-pay | Admitting: Adult Health

## 2020-09-30 ENCOUNTER — Encounter (HOSPITAL_COMMUNITY): Payer: Self-pay | Admitting: Cardiovascular Disease

## 2020-09-30 ENCOUNTER — Inpatient Hospital Stay: Payer: PPO | Admitting: Adult Health

## 2020-09-30 DIAGNOSIS — I2510 Atherosclerotic heart disease of native coronary artery without angina pectoris: Secondary | ICD-10-CM

## 2020-09-30 DIAGNOSIS — I5023 Acute on chronic systolic (congestive) heart failure: Secondary | ICD-10-CM

## 2020-09-30 DIAGNOSIS — I4821 Permanent atrial fibrillation: Secondary | ICD-10-CM

## 2020-09-30 HISTORY — PX: LEFT HEART CATH AND CORONARY ANGIOGRAPHY: CATH118249

## 2020-09-30 LAB — BASIC METABOLIC PANEL
Anion gap: 7 (ref 5–15)
BUN: 25 mg/dL — ABNORMAL HIGH (ref 8–23)
CO2: 21 mmol/L — ABNORMAL LOW (ref 22–32)
Calcium: 8.9 mg/dL (ref 8.9–10.3)
Chloride: 107 mmol/L (ref 98–111)
Creatinine, Ser: 1.04 mg/dL (ref 0.61–1.24)
GFR, Estimated: >60 mL/min (ref >60)
Glucose, Bld: 81 mg/dL (ref 70–99)
Potassium: 4.5 mmol/L (ref 3.5–5.1)
Sodium: 135 mmol/L (ref 135–145)

## 2020-09-30 LAB — CBC
HCT: 45 % (ref 39.0–52.0)
Hemoglobin: 15.7 g/dL (ref 13.0–17.0)
MCH: 33.5 pg (ref 26.0–34.0)
MCHC: 34.9 g/dL (ref 30.0–36.0)
MCV: 96.2 fL (ref 80.0–100.0)
Platelets: 236 10*3/uL (ref 150–400)
RBC: 4.68 MIL/uL (ref 4.22–5.81)
RDW: 13.9 % (ref 11.5–15.5)
WBC: 9.4 10*3/uL (ref 4.0–10.5)
nRBC: 0 % (ref 0.0–0.2)

## 2020-09-30 LAB — HEPARIN LEVEL (UNFRACTIONATED): Heparin Unfractionated: 0.6 [IU]/mL (ref 0.30–0.70)

## 2020-09-30 SURGERY — LEFT HEART CATH AND CORONARY ANGIOGRAPHY
Anesthesia: LOCAL

## 2020-09-30 MED ORDER — LABETALOL HCL 5 MG/ML IV SOLN: 10.0000 mg | INTRAVENOUS | Status: AC | PRN

## 2020-09-30 MED ORDER — HEPARIN (PORCINE) IN NACL 1000-0.9 UT/500ML-% IV SOLN
INTRAVENOUS | Status: DC | PRN
  Administered 2020-09-30 (×2): 500 mL

## 2020-09-30 MED ORDER — ASPIRIN 81 MG PO CHEW
81.0000 mg | CHEWABLE_TABLET | ORAL | Status: AC
  Administered 2020-09-30: 81 mg via ORAL
  Filled 2020-09-30: qty 1

## 2020-09-30 MED ORDER — FENTANYL CITRATE (PF) 100 MCG/2ML IJ SOLN
INTRAMUSCULAR | Status: DC | PRN
  Administered 2020-09-30: 25 ug via INTRAVENOUS

## 2020-09-30 MED ORDER — SODIUM CHLORIDE 0.9% FLUSH
3.0000 mL | Freq: Two times a day (BID) | INTRAVENOUS | Status: DC
  Administered 2020-10-01: 3 mL via INTRAVENOUS

## 2020-09-30 MED ORDER — SODIUM CHLORIDE 0.9 % IV SOLN: INTRAVENOUS | Status: AC

## 2020-09-30 MED ORDER — MIDAZOLAM HCL 2 MG/2ML IJ SOLN
INTRAMUSCULAR | Status: AC
  Filled 2020-09-30: qty 2

## 2020-09-30 MED ORDER — SODIUM CHLORIDE 0.9% FLUSH: 3.0000 mL | INTRAVENOUS | Status: DC | PRN

## 2020-09-30 MED ORDER — MIDAZOLAM HCL 2 MG/2ML IJ SOLN
INTRAMUSCULAR | Status: DC | PRN
  Administered 2020-09-30: 1 mg via INTRAVENOUS

## 2020-09-30 MED ORDER — IOHEXOL 350 MG/ML SOLN
INTRAVENOUS | Status: DC | PRN
  Administered 2020-09-30: 65 mL via INTRA_ARTERIAL

## 2020-09-30 MED ORDER — FENTANYL CITRATE (PF) 100 MCG/2ML IJ SOLN
INTRAMUSCULAR | Status: AC
  Filled 2020-09-30: qty 2

## 2020-09-30 MED ORDER — MORPHINE SULFATE (PF) 2 MG/ML IV SOLN
2.0000 mg | INTRAVENOUS | Status: DC | PRN
Start: 2020-09-30 — End: 2020-10-01

## 2020-09-30 MED ORDER — HYDRALAZINE HCL 20 MG/ML IJ SOLN: 10.0000 mg | INTRAMUSCULAR | Status: AC | PRN

## 2020-09-30 MED ORDER — VERAPAMIL HCL 2.5 MG/ML IV SOLN
INTRAVENOUS | Status: AC
  Filled 2020-09-30: qty 2

## 2020-09-30 MED ORDER — HEPARIN (PORCINE) IN NACL 1000-0.9 UT/500ML-% IV SOLN
INTRAVENOUS | Status: AC
  Filled 2020-09-30: qty 1000

## 2020-09-30 MED ORDER — SODIUM CHLORIDE 0.9% FLUSH
3.0000 mL | Freq: Two times a day (BID) | INTRAVENOUS | Status: DC
  Administered 2020-09-30: 3 mL via INTRAVENOUS

## 2020-09-30 MED ORDER — SODIUM CHLORIDE 0.9 % IV SOLN: INTRAVENOUS | Status: DC

## 2020-09-30 MED ORDER — HEPARIN SODIUM (PORCINE) 1000 UNIT/ML IJ SOLN
INTRAMUSCULAR | Status: AC
  Filled 2020-09-30: qty 1

## 2020-09-30 MED ORDER — LIDOCAINE HCL (PF) 1 % IJ SOLN
INTRAMUSCULAR | Status: DC | PRN
  Administered 2020-09-30: 2 mL via INTRADERMAL
  Administered 2020-09-30: 20 mL via INTRADERMAL

## 2020-09-30 MED ORDER — ACETAMINOPHEN 325 MG PO TABS: 650.0000 mg | ORAL_TABLET | ORAL | Status: DC | PRN

## 2020-09-30 MED ORDER — SODIUM CHLORIDE 0.9 % IV SOLN: 250.0000 mL | INTRAVENOUS | Status: DC | PRN

## 2020-09-30 MED ORDER — NITROGLYCERIN 1 MG/10 ML FOR IR/CATH LAB
INTRA_ARTERIAL | Status: AC
  Filled 2020-09-30: qty 10

## 2020-09-30 MED ORDER — LIDOCAINE HCL (PF) 1 % IJ SOLN
INTRAMUSCULAR | Status: AC
  Filled 2020-09-30: qty 30

## 2020-09-30 MED ORDER — ASPIRIN 81 MG PO CHEW
81.0000 mg | CHEWABLE_TABLET | Freq: Every day | ORAL | Status: DC
  Administered 2020-10-01: 81 mg via ORAL
  Filled 2020-09-30: qty 1

## 2020-09-30 MED ORDER — METOPROLOL SUCCINATE ER 25 MG PO TB24
12.5000 mg | ORAL_TABLET | Freq: Every day | ORAL | Status: DC
  Administered 2020-09-30: 12.5 mg via ORAL

## 2020-09-30 SURGICAL SUPPLY — 13 items
CATH INFINITI 5FR MULTPACK ANG (CATHETERS) ×1 IMPLANT
CLOSURE MYNX CONTROL 5F (Vascular Products) ×1 IMPLANT
ELECT DEFIB PAD ADLT CADENCE (PAD) ×1 IMPLANT
GLIDESHEATH SLEND A-KIT 6F 22G (SHEATH) ×1 IMPLANT
GUIDEWIRE INQWIRE 1.5J.035X260 (WIRE) IMPLANT
INQWIRE 1.5J .035X260CM (WIRE) ×2
KIT HEART LEFT (KITS) ×2 IMPLANT
PACK CARDIAC CATHETERIZATION (CUSTOM PROCEDURE TRAY) ×2 IMPLANT
SHEATH PINNACLE 5F 10CM (SHEATH) ×1 IMPLANT
TRANSDUCER W/STOPCOCK (MISCELLANEOUS) ×2 IMPLANT
TUBING CIL FLEX 10 FLL-RA (TUBING) ×2 IMPLANT
WIRE EMERALD 3MM-J .035X150CM (WIRE) ×1 IMPLANT
WIRE HI TORQ VERSACORE-J 145CM (WIRE) ×1 IMPLANT

## 2020-09-30 NOTE — Interval H&P Note (Signed)
Cath Lab Visit (complete for each Cath Lab visit)  Clinical Evaluation Leading to the Procedure:   ACS: Yes.    Non-ACS:    Anginal Classification: CCS II  Anti-ischemic medical therapy: Minimal Therapy (1 class of medications)  Non-Invasive Test Results: No non-invasive testing performed  Prior CABG: No previous CABG      History and Physical Interval Note:  09/30/2020 9:46 AM  Lonnie Boyd  has presented today for surgery, with the diagnosis of NSTEMI.  The various methods of treatment have been discussed with the patient and family. After consideration of risks, benefits and other options for treatment, the patient has consented to  Procedure(s): LEFT HEART CATH AND CORONARY ANGIOGRAPHY (N/A) as a surgical intervention.  The patient's history has been reviewed, patient examined, no change in status, stable for surgery.  I have reviewed the patient's chart and labs.  Questions were answered to the patient's satisfaction.     Lonnie Boyd

## 2020-09-30 NOTE — Progress Notes (Signed)
ANTICOAGULATION CONSULT NOTE  Pharmacy Consult for heparin Indication: chest pain/ACS  Allergies  Allergen Reactions   Ace Inhibitors Cough    Patient Measurements: Height: '5\' 8"'$  (172.7 cm) Weight: 74.8 kg (165 lb) IBW/kg (Calculated) : 68.4 Heparin Dosing Weight: 74.8 kg  Vital Signs: Temp: 97.9 F (36.6 C) (09/06 0805) Temp Source: Oral (09/06 0805) BP: 105/70 (09/06 0805) Pulse Rate: 56 (09/06 0805)  Labs: Recent Labs    09/27/20 1611 09/27/20 1611 09/27/20 1808 09/28/20 0502 09/28/20 1357 09/29/20 0031 09/29/20 0039 09/29/20 0626 09/29/20 0823 09/29/20 1612 09/30/20 0034  HGB 14.8  --   --  15.0  --  15.2  --   --   --   --  15.7  HCT 44.9  --   --  43.9  --  44.6  --   --   --   --  45.0  PLT 270  --   --  250  --  241  --   --   --   --  236  APTT 31  --   --  31 60*  --  105*  --   --   --   --   LABPROT 14.9  --   --   --   --   --   --   --   --   --   --   INR 1.2  --   --   --   --   --   --   --   --   --   --   HEPARINUNFRC  --    < >  --  0.13* 0.35  --  0.62   < > 0.97* 0.65 0.60  CREATININE 1.24  --   --  1.04  --   --   --   --   --   --   --   TROPONINIHS 5,942*  --  >24,000*  --   --   --   --   --   --   --   --    < > = values in this interval not displayed.     Estimated Creatinine Clearance: 53 mL/min (by C-G formula based on SCr of 1.04 mg/dL).  Assessment:  80 yo M presented with chest pain and found to have STEMI on EKG with delayed arrival. Of note, patient has history of AF on PTA Eliquis (last dose 9/3 AM). Code STEMI was cancelled and patient was admitted for Eliquis washout before intervention. Pharmacy consulted for heparin dosing for ACS. Plans for cardiac cath today.  Heparin level 0.60 is at goal on 1200 units/hr. CBC stable.   Goal of Therapy:  Heparin level 0.3-0.7 units/ml aPTT 66-102 seconds Monitor platelets by anticoagulation protocol: Yes   Plan:  Continue heparin at 1200 units/hr Will follow plans after  cath  Hildred Laser, PharmD Clinical Pharmacist **Pharmacist phone directory can now be found on [email or web address] ([email or web address]).  Listed under [place].

## 2020-09-30 NOTE — Progress Notes (Signed)
Progress Note  Patient Name: AADARSH WALLY Date of Encounter: 09/30/2020  [facility] Cardiologist: Jenne Campus, MD   Subjective   No chest pain overnight. Planned for cardiac cath today.   Inpatient Medications    Scheduled Meds:  aspirin  81 mg Oral Pre-Cath   aspirin EC  81 mg Oral Daily   atorvastatin  80 mg Oral Daily   clopidogrel  75 mg Oral Daily   [START ON 10/01/2020] empagliflozin  10 mg Oral Daily   metoprolol succinate  25 mg Oral Daily   sacubitril-valsartan  1 tablet Oral BID   [START ON 10/01/2020] spironolactone  25 mg Oral Daily   Continuous Infusions:  sodium chloride 10 mL/hr at 09/29/20 1055   sodium chloride     heparin 1,200 Units/hr (09/29/20 1315)   PRN Meds: acetaminophen, nitroGLYCERIN   Vital Signs    Vitals:   09/29/20 2016 09/29/20 2334 09/30/20 0415 09/30/20 0805  BP: 132/87 124/85 (!) 141/92 105/70  Pulse: 65 63 67 (!) 56  Resp: 17 (!) '21 15 16  '$ Temp: 98.2 F (36.8 C) 98 F (36.7 C) 97.6 F (36.4 C) 97.9 F (36.6 C)  TempSrc: Oral Oral Oral Oral  SpO2: 94% 95% 96% 98%  Weight:      Height:        Intake/Output Summary (Last 24 hours) at 09/30/2020 0843 Last data filed at 09/30/2020 0805 Gross per 24 hour  Intake 673.42 ml  Output 1300 ml  Net -626.58 ml   Last 3 Weights 09/27/2020 09/09/2020 08/25/2020  Weight (lbs) 165 lb 160 lb 3.2 oz 159 lb  Weight (kg) 74.844 kg 72.666 kg 72.122 kg      Telemetry    Afib rates 40-50, upper 30s at times - Personally Reviewed  ECG    No new tracing this morning  Physical Exam   GEN: No acute distress.  HOH Neck: No JVD Cardiac: Irreg Irreg, no murmurs, rubs, or gallops.  Respiratory: Clear to auscultation bilaterally. GI: Soft, nontender, non-distended  MS: No edema; No deformity. Neuro:  Nonfocal  Psych: Normal affect   Labs    High Sensitivity Troponin:   Recent Labs  Lab 09/27/20 1611 09/27/20 1808  TROPONINIHS 5,942* >24,000*      Chemistry Recent Labs   Lab 09/27/20 1611 09/28/20 0502  NA 137 137  K 4.1 4.4  CL 104 105  CO2 25 25  GLUCOSE 113* 109*  BUN 29* 25*  CREATININE 1.24 1.04  CALCIUM 9.0 8.7*  PROT 6.6  --   ALBUMIN 3.6  --   AST 57*  --   ALT 26  --   ALKPHOS 77  --   BILITOT 1.3*  --   GFRNONAA 58* >60  ANIONGAP 8 7     Hematology Recent Labs  Lab 09/28/20 0502 09/29/20 0031 09/30/20 0034  WBC 9.9 10.1 9.4  RBC 4.56 4.58 4.68  HGB 15.0 15.2 15.7  HCT 43.9 44.6 45.0  MCV 96.3 97.4 96.2  MCH 32.9 33.2 33.5  MCHC 34.2 34.1 34.9  RDW 13.4 13.8 13.9  PLT 250 241 236    BNP Recent Labs  Lab 09/27/20 1959  BNP 558.2*     DDimer No results for input(s): DDIMER in the last 168 hours.   Radiology    ECHOCARDIOGRAM COMPLETE  Result Date: 09/28/2020    ECHOCARDIOGRAM REPORT   Patient Name:   JARRIS RUECKER Date of Exam: 09/28/2020 Medical Rec #:  [id number]  Height:       68.0 in Accession #:    [id number]      Weight:       165.0 lb Date of Birth:  1940-05-27        BSA:          1.883 m Patient Age:    80 years        BP:           129/89 mmHg Patient Gender: M               HR:           59 bpm. Exam Location:  Inpatient Procedure: 2D Echo, Cardiac Doppler and Color Doppler                                 MODIFIED REPORT:  This report was modified by Cherlynn Kaiser MD on 09/28/2020 due to measurement                                    revision.  Indications:     Chest pain  History:         Patient has prior history of Echocardiogram examinations, most                  recent 08/21/2020. Cardiomyopathy, CAD and Previous Myocardial                  Infarction, COPD; Arrythmias:Atrial Fibrillation. 09/20/2016                  Left heart cath.  Sonographer:     Williamson Referring Phys:  [id number] HAO MENG Diagnosing Phys: Cherlynn Kaiser MD IMPRESSIONS  1. Left ventricular ejection fraction, by estimation, is 30 to 35%. The left ventricle has moderately decreased function. The left ventricle demonstrates  regional wall motion abnormalities (see scoring diagram/findings for description). There is mild left ventricular hypertrophy. Left ventricular diastolic parameters are indeterminate.  2. Right ventricular systolic function is normal. The right ventricular size is normal. There is normal pulmonary artery systolic pressure. The estimated right ventricular systolic pressure is 0000000 mmHg.  3. Left atrial size was mildly dilated.  4. Right atrial size was mild to moderately dilated.  5. The mitral valve is grossly normal. Trivial mitral valve regurgitation. No evidence of mitral stenosis.  6. The aortic valve is grossly normal. Aortic valve regurgitation is trivial. No aortic stenosis is present.  7. The inferior vena cava is normal in size with greater than 50% respiratory variability, suggesting right atrial pressure of 3 mmHg. FINDINGS  Left Ventricle: Left ventricular ejection fraction, by estimation, is 30 to 35%. The left ventricle has moderately decreased function. The left ventricle demonstrates regional wall motion abnormalities. The left ventricular internal cavity size was normal in size. There is mild left ventricular hypertrophy. Left ventricular diastolic parameters are indeterminate.  LV Wall Scoring: The mid anterior segment is akinetic. The mid and distal lateral wall, mid and distal anterior septum, mid inferoseptal segment, apical anterior segment, and apical inferior segment are hypokinetic. Right Ventricle: The right ventricular size is normal. No increase in right ventricular wall thickness. Right ventricular systolic function is normal. There is normal pulmonary artery systolic pressure. The tricuspid regurgitant velocity is 2.16 m/s, and  with an assumed right atrial pressure of 3 mmHg,  the estimated right ventricular systolic pressure is 0000000 mmHg. Left Atrium: Left atrial size was mildly dilated. Right Atrium: Right atrial size was mild to moderately dilated. Pericardium: There is no evidence  of pericardial effusion. Mitral Valve: The mitral valve is grossly normal. Trivial mitral valve regurgitation. No evidence of mitral valve stenosis. Tricuspid Valve: The tricuspid valve is normal in structure. Tricuspid valve regurgitation is mild . No evidence of tricuspid stenosis. Aortic Valve: The aortic valve is grossly normal. Aortic valve regurgitation is trivial. Aortic regurgitation PHT measures 834 msec. No aortic stenosis is present. Aortic valve mean gradient measures 3.0 mmHg. Aortic valve peak gradient measures 6.2 mmHg. Aortic valve area, by VTI measures 1.93 cm. Pulmonic Valve: The pulmonic valve was normal in structure. Pulmonic valve regurgitation is not visualized. No evidence of pulmonic stenosis. Aorta: The aortic root is normal in size and structure. Venous: The inferior vena cava was not well visualized. The inferior vena cava is normal in size with greater than 50% respiratory variability, suggesting right atrial pressure of 3 mmHg. IAS/Shunts: The interatrial septum was not well visualized.  LEFT VENTRICLE PLAX 2D LVIDd:         5.40 cm      Diastology LVIDs:         3.80 cm      LV e' medial: 3.37 cm/s LV PW:         1.00 cm LV IVS:        1.40 cm LVOT diam:     2.25 cm LV SV:         49 LV SV Index:   26 LVOT Area:     3.98 cm  LV Volumes (MOD) LV vol d, MOD A2C: 124.0 ml LV vol d, MOD A4C: 110.0 ml LV vol s, MOD A2C: 71.0 ml LV vol s, MOD A4C: 68.0 ml LV SV MOD A2C:     53.0 ml LV SV MOD A4C:     110.0 ml LV SV MOD BP:      50.2 ml RIGHT VENTRICLE RV Basal diam:  3.40 cm RV Mid diam:    2.00 cm RV S prime:     8.38 cm/s TAPSE (M-mode): 1.5 cm LEFT ATRIUM             Index       RIGHT ATRIUM           Index LA diam:        3.80 cm 2.02 cm/m  RA Area:     23.40 cm LA Vol (A2C):   46.1 ml 24.48 ml/m RA Volume:   71.70 ml  38.07 ml/m LA Vol (A4C):   80.7 ml 42.85 ml/m LA Biplane Vol: 61.4 ml 32.60 ml/m  AORTIC VALVE                   PULMONIC VALVE AV Area (Vmax):    1.95 cm    PV  Vmax:       0.66 m/s AV Area (Vmean):   1.86 cm    PV Vmean:      42.800 cm/s AV Area (VTI):     1.93 cm    PV VTI:        0.133 m AV Vmax:           124.00 cm/s PV Peak grad:  1.7 mmHg AV Vmean:          83.800 cm/s PV Mean grad:  1.0 mmHg AV VTI:  0.251 m AV Peak Grad:      6.2 mmHg AV Mean Grad:      3.0 mmHg LVOT Vmax:         60.90 cm/s LVOT Vmean:        39.200 cm/s LVOT VTI:          0.122 m LVOT/AV VTI ratio: 0.49 AI PHT:            834 msec  AORTA Ao Root diam: 3.60 cm Ao Asc diam:  3.30 cm TRICUSPID VALVE TR Peak grad:   18.7 mmHg TR Vmax:        216.00 cm/s  SHUNTS Systemic VTI:  0.12 m Systemic Diam: 2.25 cm Cherlynn Kaiser MD Electronically signed by Cherlynn Kaiser MD Signature Date/Time: 09/28/2020/11:59:38 AM    Final (Updated)     Cardiac Studies   Echo: 09/28/20  IMPRESSIONS     1. Left ventricular ejection fraction, by estimation, is 30 to 35%. The  left ventricle has moderately decreased function. The left ventricle  demonstrates regional wall motion abnormalities (see scoring  diagram/findings for description). There is mild  left ventricular hypertrophy. Left ventricular diastolic parameters are  indeterminate.   2. Right ventricular systolic function is normal. The right ventricular  size is normal. There is normal pulmonary artery systolic pressure. The  estimated right ventricular systolic pressure is 0000000 mmHg.   3. Left atrial size was mildly dilated.   4. Right atrial size was mild to moderately dilated.   5. The mitral valve is grossly normal. Trivial mitral valve  regurgitation. No evidence of mitral stenosis.   6. The aortic valve is grossly normal. Aortic valve regurgitation is  trivial. No aortic stenosis is present.   7. The inferior vena cava is normal in size with greater than 50%  respiratory variability, suggesting right atrial pressure of 3 mmHg.   FINDINGS   Left Ventricle: Left ventricular ejection fraction, by estimation, is 30  to 35%.  The left ventricle has moderately decreased function. The left  ventricle demonstrates regional wall motion abnormalities. The left  ventricular internal cavity size was  normal in size. There is mild left ventricular hypertrophy. Left  ventricular diastolic parameters are indeterminate.      LV Wall Scoring:  The mid anterior segment is akinetic. The mid and distal lateral wall, mid  and distal anterior septum, mid inferoseptal segment, apical anterior  segment, and apical inferior segment are hypokinetic.   Right Ventricle: The right ventricular size is normal. No increase in  right ventricular wall thickness. Right ventricular systolic function is  normal. There is normal pulmonary artery systolic pressure. The tricuspid  regurgitant velocity is 2.16 m/s, and   with an assumed right atrial pressure of 3 mmHg, the estimated right  ventricular systolic pressure is 0000000 mmHg.   Left Atrium: Left atrial size was mildly dilated.   Right Atrium: Right atrial size was mild to moderately dilated.   Pericardium: There is no evidence of pericardial effusion.   Mitral Valve: The mitral valve is grossly normal. Trivial mitral valve  regurgitation. No evidence of mitral valve stenosis.   Tricuspid Valve: The tricuspid valve is normal in structure. Tricuspid  valve regurgitation is mild . No evidence of tricuspid stenosis.   Aortic Valve: The aortic valve is grossly normal. Aortic valve  regurgitation is trivial. Aortic regurgitation PHT measures 834 msec. No  aortic stenosis is present. Aortic valve mean gradient measures 3.0 mmHg.  Aortic valve peak gradient measures 6.2  mmHg. Aortic valve area, by VTI measures 1.93 cm.   Pulmonic Valve: The pulmonic valve was normal in structure. Pulmonic valve  regurgitation is not visualized. No evidence of pulmonic stenosis.   Aorta: The aortic root is normal in size and structure.   Venous: The inferior vena cava was not well visualized. The  inferior vena  cava is normal in size with greater than 50% respiratory variability,  suggesting right atrial pressure of 3 mmHg.   IAS/Shunts: The interatrial septum was not well visualized.   Patient Profile     80 y.o. male with CAD status post PCI (LAD and left circumflex in 2011) VT/VF arrest [id number], chronic systolic diastolic heart failure, tachycardia, hyperlipidemia, stroke, permanent atrial fibrillation admitted with delayed presentation anterior STEMI.  Assessment & Plan    Late presentation anterior STEMI: initially presented with EKG concerning for anterior STEMI in the field. Was pain free on arrival. Given recent Eliquis dose, STEMI was cancelled and scheduled for cath today. hsTn >24000.  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 Did have improvement to 45-50%. Back down to 35% on this admission with anterior akinesis as well as mid to distal lateral wall, anteroseptal, inferoseptal, and apical inferior segments hypokinesis.  -- on entresto 24/26 BID along with Toprol '25mg'$  daily. Plan for spiro post cath, +/- Jardiance prior to discharge   Permanent Afib: rates are controlled, actually consistently bradycardiac, down into the 30s at times -- reduce Toprol to 12.'5mg'$  daily -- Eliquis held with plans for cath, remains on IV heparin  Hx of VT/VF arrest: back in 2018. Prolonged QT on EKG (546 on 9/4, improved to 503 yesterday) -- avoiding any QT prolonging meds  HLD: LDL 143 -- on atorvastatin '80mg'$  daily (recently increased at last office visit) -- recheck Lipids/LFTs in 8 weeks  Hx of CVA: on statin, ASA  For questions or updates, please contact [place] Please consult [email or web address] for contact info under        Signed, Reino Bellis, NP  09/30/2020, 8:43 AM

## 2020-09-30 NOTE — H&P (View-Only) (Signed)
Progress Note  Patient Name: Lonnie Boyd Date of Encounter: 09/30/2020  [facility] Cardiologist: Jenne Campus, MD   Subjective   No chest pain overnight. Planned for cardiac cath today.   Inpatient Medications    Scheduled Meds:  aspirin  81 mg Oral Pre-Cath   aspirin EC  81 mg Oral Daily   atorvastatin  80 mg Oral Daily   clopidogrel  75 mg Oral Daily   [START ON 10/01/2020] empagliflozin  10 mg Oral Daily   metoprolol succinate  25 mg Oral Daily   sacubitril-valsartan  1 tablet Oral BID   [START ON 10/01/2020] spironolactone  25 mg Oral Daily   Continuous Infusions:  sodium chloride 10 mL/hr at 09/29/20 1055   sodium chloride     heparin 1,200 Units/hr (09/29/20 1315)   PRN Meds: acetaminophen, nitroGLYCERIN   Vital Signs    Vitals:   09/29/20 2016 09/29/20 2334 09/30/20 0415 09/30/20 0805  BP: 132/87 124/85 (!) 141/92 105/70  Pulse: 65 63 67 (!) 56  Resp: 17 (!) '21 15 16  '$ Temp: 98.2 F (36.8 C) 98 F (36.7 C) 97.6 F (36.4 C) 97.9 F (36.6 C)  TempSrc: Oral Oral Oral Oral  SpO2: 94% 95% 96% 98%  Weight:      Height:        Intake/Output Summary (Last 24 hours) at 09/30/2020 0843 Last data filed at 09/30/2020 0805 Gross per 24 hour  Intake 673.42 ml  Output 1300 ml  Net -626.58 ml   Last 3 Weights 09/27/2020 09/09/2020 08/25/2020  Weight (lbs) 165 lb 160 lb 3.2 oz 159 lb  Weight (kg) 74.844 kg 72.666 kg 72.122 kg      Telemetry    Afib rates 40-50, upper 30s at times - Personally Reviewed  ECG    No new tracing this morning  Physical Exam   GEN: No acute distress.  HOH Neck: No JVD Cardiac: Irreg Irreg, no murmurs, rubs, or gallops.  Respiratory: Clear to auscultation bilaterally. GI: Soft, nontender, non-distended  MS: No edema; No deformity. Neuro:  Nonfocal  Psych: Normal affect   Labs    High Sensitivity Troponin:   Recent Labs  Lab 09/27/20 1611 09/27/20 1808  TROPONINIHS 5,942* >24,000*      Chemistry Recent Labs   Lab 09/27/20 1611 09/28/20 0502  NA 137 137  K 4.1 4.4  CL 104 105  CO2 25 25  GLUCOSE 113* 109*  BUN 29* 25*  CREATININE 1.24 1.04  CALCIUM 9.0 8.7*  PROT 6.6  --   ALBUMIN 3.6  --   AST 57*  --   ALT 26  --   ALKPHOS 77  --   BILITOT 1.3*  --   GFRNONAA 58* >60  ANIONGAP 8 7     Hematology Recent Labs  Lab 09/28/20 0502 09/29/20 0031 09/30/20 0034  WBC 9.9 10.1 9.4  RBC 4.56 4.58 4.68  HGB 15.0 15.2 15.7  HCT 43.9 44.6 45.0  MCV 96.3 97.4 96.2  MCH 32.9 33.2 33.5  MCHC 34.2 34.1 34.9  RDW 13.4 13.8 13.9  PLT 250 241 236    BNP Recent Labs  Lab 09/27/20 1959  BNP 558.2*     DDimer No results for input(s): DDIMER in the last 168 hours.   Radiology    ECHOCARDIOGRAM COMPLETE  Result Date: 09/28/2020    ECHOCARDIOGRAM REPORT   Patient Name:   Lonnie Boyd Date of Exam: 09/28/2020 Medical Rec #:  [id number]  Height:       68.0 in Accession #:    [id number]      Weight:       165.0 lb Date of Birth:  1940/04/15        BSA:          1.883 m Patient Age:    80 years        BP:           129/89 mmHg Patient Gender: M               HR:           59 bpm. Exam Location:  Inpatient Procedure: 2D Echo, Cardiac Doppler and Color Doppler                                 MODIFIED REPORT:  This report was modified by Cherlynn Kaiser MD on 09/28/2020 due to measurement                                    revision.  Indications:     Chest pain  History:         Patient has prior history of Echocardiogram examinations, most                  recent 08/21/2020. Cardiomyopathy, CAD and Previous Myocardial                  Infarction, COPD; Arrythmias:Atrial Fibrillation. 09/20/2016                  Left heart cath.  Sonographer:     [place] Referring Phys:  [id number] HAO MENG Diagnosing Phys: Cherlynn Kaiser MD IMPRESSIONS  1. Left ventricular ejection fraction, by estimation, is 30 to 35%. The left ventricle has moderately decreased function. The left ventricle demonstrates  regional wall motion abnormalities (see scoring diagram/findings for description). There is mild left ventricular hypertrophy. Left ventricular diastolic parameters are indeterminate.  2. Right ventricular systolic function is normal. The right ventricular size is normal. There is normal pulmonary artery systolic pressure. The estimated right ventricular systolic pressure is 0000000 mmHg.  3. Left atrial size was mildly dilated.  4. Right atrial size was mild to moderately dilated.  5. The mitral valve is grossly normal. Trivial mitral valve regurgitation. No evidence of mitral stenosis.  6. The aortic valve is grossly normal. Aortic valve regurgitation is trivial. No aortic stenosis is present.  7. The inferior vena cava is normal in size with greater than 50% respiratory variability, suggesting right atrial pressure of 3 mmHg. FINDINGS  Left Ventricle: Left ventricular ejection fraction, by estimation, is 30 to 35%. The left ventricle has moderately decreased function. The left ventricle demonstrates regional wall motion abnormalities. The left ventricular internal cavity size was normal in size. There is mild left ventricular hypertrophy. Left ventricular diastolic parameters are indeterminate.  LV Wall Scoring: The mid anterior segment is akinetic. The mid and distal lateral wall, mid and distal anterior septum, mid inferoseptal segment, apical anterior segment, and apical inferior segment are hypokinetic. Right Ventricle: The right ventricular size is normal. No increase in right ventricular wall thickness. Right ventricular systolic function is normal. There is normal pulmonary artery systolic pressure. The tricuspid regurgitant velocity is 2.16 m/s, and  with an assumed right atrial pressure of 3 mmHg,  the estimated right ventricular systolic pressure is 0000000 mmHg. Left Atrium: Left atrial size was mildly dilated. Right Atrium: Right atrial size was mild to moderately dilated. Pericardium: There is no evidence  of pericardial effusion. Mitral Valve: The mitral valve is grossly normal. Trivial mitral valve regurgitation. No evidence of mitral valve stenosis. Tricuspid Valve: The tricuspid valve is normal in structure. Tricuspid valve regurgitation is mild . No evidence of tricuspid stenosis. Aortic Valve: The aortic valve is grossly normal. Aortic valve regurgitation is trivial. Aortic regurgitation PHT measures 834 msec. No aortic stenosis is present. Aortic valve mean gradient measures 3.0 mmHg. Aortic valve peak gradient measures 6.2 mmHg. Aortic valve area, by VTI measures 1.93 cm. Pulmonic Valve: The pulmonic valve was normal in structure. Pulmonic valve regurgitation is not visualized. No evidence of pulmonic stenosis. Aorta: The aortic root is normal in size and structure. Venous: The inferior vena cava was not well visualized. The inferior vena cava is normal in size with greater than 50% respiratory variability, suggesting right atrial pressure of 3 mmHg. IAS/Shunts: The interatrial septum was not well visualized.  LEFT VENTRICLE PLAX 2D LVIDd:         5.40 cm      Diastology LVIDs:         3.80 cm      LV e' medial: 3.37 cm/s LV PW:         1.00 cm LV IVS:        1.40 cm LVOT diam:     2.25 cm LV SV:         49 LV SV Index:   26 LVOT Area:     3.98 cm  LV Volumes (MOD) LV vol d, MOD A2C: 124.0 ml LV vol d, MOD A4C: 110.0 ml LV vol s, MOD A2C: 71.0 ml LV vol s, MOD A4C: 68.0 ml LV SV MOD A2C:     53.0 ml LV SV MOD A4C:     110.0 ml LV SV MOD BP:      50.2 ml RIGHT VENTRICLE RV Basal diam:  3.40 cm RV Mid diam:    2.00 cm RV S prime:     8.38 cm/s TAPSE (M-mode): 1.5 cm LEFT ATRIUM             Index       RIGHT ATRIUM           Index LA diam:        3.80 cm 2.02 cm/m  RA Area:     23.40 cm LA Vol (A2C):   46.1 ml 24.48 ml/m RA Volume:   71.70 ml  38.07 ml/m LA Vol (A4C):   80.7 ml 42.85 ml/m LA Biplane Vol: 61.4 ml 32.60 ml/m  AORTIC VALVE                   PULMONIC VALVE AV Area (Vmax):    1.95 cm    PV  Vmax:       0.66 m/s AV Area (Vmean):   1.86 cm    PV Vmean:      42.800 cm/s AV Area (VTI):     1.93 cm    PV VTI:        0.133 m AV Vmax:           124.00 cm/s PV Peak grad:  1.7 mmHg AV Vmean:          83.800 cm/s PV Mean grad:  1.0 mmHg AV VTI:  0.251 m AV Peak Grad:      6.2 mmHg AV Mean Grad:      3.0 mmHg LVOT Vmax:         60.90 cm/s LVOT Vmean:        39.200 cm/s LVOT VTI:          0.122 m LVOT/AV VTI ratio: 0.49 AI PHT:            834 msec  AORTA Ao Root diam: 3.60 cm Ao Asc diam:  3.30 cm TRICUSPID VALVE TR Peak grad:   18.7 mmHg TR Vmax:        216.00 cm/s  SHUNTS Systemic VTI:  0.12 m Systemic Diam: 2.25 cm Cherlynn Kaiser MD Electronically signed by Cherlynn Kaiser MD Signature Date/Time: 09/28/2020/11:59:38 AM    Final (Updated)     Cardiac Studies   Echo: 09/28/20  IMPRESSIONS     1. Left ventricular ejection fraction, by estimation, is 30 to 35%. The  left ventricle has moderately decreased function. The left ventricle  demonstrates regional wall motion abnormalities (see scoring  diagram/findings for description). There is mild  left ventricular hypertrophy. Left ventricular diastolic parameters are  indeterminate.   2. Right ventricular systolic function is normal. The right ventricular  size is normal. There is normal pulmonary artery systolic pressure. The  estimated right ventricular systolic pressure is 0000000 mmHg.   3. Left atrial size was mildly dilated.   4. Right atrial size was mild to moderately dilated.   5. The mitral valve is grossly normal. Trivial mitral valve  regurgitation. No evidence of mitral stenosis.   6. The aortic valve is grossly normal. Aortic valve regurgitation is  trivial. No aortic stenosis is present.   7. The inferior vena cava is normal in size with greater than 50%  respiratory variability, suggesting right atrial pressure of 3 mmHg.   FINDINGS   Left Ventricle: Left ventricular ejection fraction, by estimation, is 30  to 35%.  The left ventricle has moderately decreased function. The left  ventricle demonstrates regional wall motion abnormalities. The left  ventricular internal cavity size was  normal in size. There is mild left ventricular hypertrophy. Left  ventricular diastolic parameters are indeterminate.      LV Wall Scoring:  The mid anterior segment is akinetic. The mid and distal lateral wall, mid  and distal anterior septum, mid inferoseptal segment, apical anterior  segment, and apical inferior segment are hypokinetic.   Right Ventricle: The right ventricular size is normal. No increase in  right ventricular wall thickness. Right ventricular systolic function is  normal. There is normal pulmonary artery systolic pressure. The tricuspid  regurgitant velocity is 2.16 m/s, and   with an assumed right atrial pressure of 3 mmHg, the estimated right  ventricular systolic pressure is 0000000 mmHg.   Left Atrium: Left atrial size was mildly dilated.   Right Atrium: Right atrial size was mild to moderately dilated.   Pericardium: There is no evidence of pericardial effusion.   Mitral Valve: The mitral valve is grossly normal. Trivial mitral valve  regurgitation. No evidence of mitral valve stenosis.   Tricuspid Valve: The tricuspid valve is normal in structure. Tricuspid  valve regurgitation is mild . No evidence of tricuspid stenosis.   Aortic Valve: The aortic valve is grossly normal. Aortic valve  regurgitation is trivial. Aortic regurgitation PHT measures 834 msec. No  aortic stenosis is present. Aortic valve mean gradient measures 3.0 mmHg.  Aortic valve peak gradient measures 6.2  mmHg. Aortic valve area, by VTI measures 1.93 cm.   Pulmonic Valve: The pulmonic valve was normal in structure. Pulmonic valve  regurgitation is not visualized. No evidence of pulmonic stenosis.   Aorta: The aortic root is normal in size and structure.   Venous: The inferior vena cava was not well visualized. The  inferior vena  cava is normal in size with greater than 50% respiratory variability,  suggesting right atrial pressure of 3 mmHg.   IAS/Shunts: The interatrial septum was not well visualized.   Patient Profile     80 y.o. male with CAD status post PCI (LAD and left circumflex in 2011) VT/VF arrest [id number], chronic systolic diastolic heart failure, tachycardia, hyperlipidemia, stroke, permanent atrial fibrillation admitted with delayed presentation anterior STEMI.  Assessment & Plan    Late presentation anterior STEMI: initially presented with EKG concerning for anterior STEMI in the field. Was pain free on arrival. Given recent Eliquis dose, STEMI was cancelled and scheduled for cath today. hsTn >24000.  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 Did have improvement to 45-50%. Back down to 35% on this admission with anterior akinesis as well as mid to distal lateral wall, anteroseptal, inferoseptal, and apical inferior segments hypokinesis.  -- on entresto 24/26 BID along with Toprol '25mg'$  daily. Plan for spiro post cath, +/- Jardiance prior to discharge   Permanent Afib: rates are controlled, actually consistently bradycardiac, down into the 30s at times -- reduce Toprol to 12.'5mg'$  daily -- Eliquis held with plans for cath, remains on IV heparin  Hx of VT/VF arrest: back in 2018. Prolonged QT on EKG (546 on 9/4, improved to 503 yesterday) -- avoiding any QT prolonging meds  HLD: LDL 143 -- on atorvastatin '80mg'$  daily (recently increased at last office visit) -- recheck Lipids/LFTs in 8 weeks  Hx of CVA: on statin, ASA  For questions or updates, please contact [place] Please consult [email or web address] for contact info under        Signed, Reino Bellis, NP  09/30/2020, 8:43 AM

## 2020-09-30 NOTE — TOC Benefit Eligibility Note (Signed)
Patient Teacher, English as a foreign language completed.    The patient is currently admitted and upon discharge could be taking Entresto 24-26 mg.  The current 30 day co-pay is, $45.00.   The patient is currently admitted and upon discharge could be taking Jardiance 10 mg.  The current 30 day co-pay is, $45.00.   The patient is currently admitted and upon discharge could be taking Farxiga 10 mg.  Non Formulary   The patient is insured through [employer] Patient Advocate Specialist [place] Team Direct Number: [phone]  Fax: [phone]

## 2020-09-30 NOTE — Progress Notes (Signed)
Pt arrived back to room on 4E from cath lab. Femoral site level 0 and VSS  Raelyn Number, RN

## 2020-10-01 ENCOUNTER — Other Ambulatory Visit (HOSPITAL_COMMUNITY): Payer: Self-pay

## 2020-10-01 LAB — CBC
HCT: 46.6 % (ref 39.0–52.0)
Hemoglobin: 15.5 g/dL (ref 13.0–17.0)
MCH: 32.4 pg (ref 26.0–34.0)
MCHC: 33.3 g/dL (ref 30.0–36.0)
MCV: 97.3 fL (ref 80.0–100.0)
Platelets: 267 10*3/uL (ref 150–400)
RBC: 4.79 MIL/uL (ref 4.22–5.81)
RDW: 13.9 % (ref 11.5–15.5)
WBC: 10.5 10*3/uL (ref 4.0–10.5)
nRBC: 0 % (ref 0.0–0.2)

## 2020-10-01 LAB — BASIC METABOLIC PANEL
Anion gap: 7 (ref 5–15)
BUN: 26 mg/dL — ABNORMAL HIGH (ref 8–23)
CO2: 22 mmol/L (ref 22–32)
Calcium: 8.7 mg/dL — ABNORMAL LOW (ref 8.9–10.3)
Chloride: 106 mmol/L (ref 98–111)
Creatinine, Ser: 1.06 mg/dL (ref 0.61–1.24)
GFR, Estimated: >60 mL/min (ref >60)
Glucose, Bld: 102 mg/dL — ABNORMAL HIGH (ref 70–99)
Potassium: 4.4 mmol/L (ref 3.5–5.1)
Sodium: 135 mmol/L (ref 135–145)

## 2020-10-01 MED ORDER — SPIRONOLACTONE 25 MG PO TABS
25.0000 mg | ORAL_TABLET | Freq: Every day | ORAL | 11 refills | Status: DC
  Filled 2020-10-01: qty 30, 30d supply, fill #0

## 2020-10-01 MED ORDER — SACUBITRIL-VALSARTAN 24-26 MG PO TABS
1.0000 | ORAL_TABLET | Freq: Two times a day (BID) | ORAL | 11 refills | Status: DC
  Filled 2020-10-01: qty 60, 30d supply, fill #0

## 2020-10-01 MED ORDER — EMPAGLIFLOZIN 10 MG PO TABS
10.0000 mg | ORAL_TABLET | Freq: Every day | ORAL | 11 refills | Status: DC
  Filled 2020-10-01: qty 14, 14d supply, fill #0

## 2020-10-01 MED ORDER — APIXABAN 5 MG PO TABS
5.0000 mg | ORAL_TABLET | Freq: Two times a day (BID) | ORAL | Status: DC
  Administered 2020-10-01: 5 mg via ORAL
  Filled 2020-10-01: qty 1

## 2020-10-01 MED FILL — Verapamil HCl IV Soln 2.5 MG/ML: INTRAVENOUS | Qty: 2 | Status: AC

## 2020-10-01 MED FILL — Heparin Sodium (Porcine) Inj 1000 Unit/ML: INTRAMUSCULAR | Qty: 10 | Status: AC

## 2020-10-01 MED FILL — Nitroglycerin IV Soln 100 MCG/ML in D5W: INTRA_ARTERIAL | Qty: 10 | Status: AC

## 2020-10-01 NOTE — Progress Notes (Signed)
Pt to be discharged from 4E to home w/son. D/c teaching provided, including medications. Telemetry and IV removed.   Raelyn Number, RN

## 2020-10-01 NOTE — Care Management Important Message (Signed)
Important Message  Patient Details  Name: CHEE MEASEL MRN: [id number] Date of Birth: 04/13/1940   Medicare Important Message Given:  Yes     Shelda Altes 10/01/2020, 9:36 AM

## 2020-10-01 NOTE — Discharge Summary (Signed)
Discharge Summary    Patient ID: Lonnie Boyd MRN: [id number]; DOB: Jun 14, 1940  Admit date: 09/27/2020 Discharge date: 10/01/2020  PCP:  [name], MD   [facility] Providers Cardiologist:  Jenne Campus, MD     Discharge Diagnoses    Principal Problem:   Acute ST elevation myocardial infarction (STEMI) of anterior wall [facility]) Active Problems:   Ischemic cardiomyopathy   Dyslipidemia   Permanent atrial fibrillation ([place])   CAD (coronary artery disease)    Diagnostic Studies/Procedures    Cath: 09/30/20    Prox RCA to Mid RCA lesion is 45% stenosed.   Dist RCA lesion is 30% stenosed.   Prox Cx to Mid Cx lesion is 40% stenosed.   2nd Mrg lesion is 40% stenosed.   Ramus lesion is 80% stenosed.   Previously placed Prox LAD to Mid LAD stent (unknown type) is  widely patent.   IMPRESSION: Lonnie Boyd has patent proximal LAD and circumflex stents.  He does have mild in-stent restenosis within the circumflex stent.  He has nonobstructive disease in his mid and distal dominant RCA.  There is no obvious "culprit lesion" requiring intervention.  I reviewed the films with Dr. Martinique, his attending cardiologist who agrees.  Medical therapy will be recommended.  A right common femoral angiogram was performed and a Mynx closure device successfully deployed in the right common femoral artery.  The patient left lab in stable condition.   [name]. MD, [facility]  Diagnostic Dominance: Right   Echo: 09/28/20  IMPRESSIONS     1. Left ventricular ejection fraction, by estimation, is 30 to 35%. The  left ventricle has moderately decreased function. The left ventricle  demonstrates regional wall motion abnormalities (see scoring  diagram/findings for description). There is mild  left ventricular hypertrophy. Left ventricular diastolic parameters are  indeterminate.   2. Right ventricular systolic function is normal. The right ventricular  size is normal. There is normal  pulmonary artery systolic pressure. The  estimated right ventricular systolic pressure is 0000000 mmHg.   3. Left atrial size was mildly dilated.   4. Right atrial size was mild to moderately dilated.   5. The mitral valve is grossly normal. Trivial mitral valve  regurgitation. No evidence of mitral stenosis.   6. The aortic valve is grossly normal. Aortic valve regurgitation is  trivial. No aortic stenosis is present.   7. The inferior vena cava is normal in size with greater than 50%  respiratory variability, suggesting right atrial pressure of 3 mmHg.   FINDINGS   Left Ventricle: Left ventricular ejection fraction, by estimation, is 30  to 35%. The left ventricle has moderately decreased function. The left  ventricle demonstrates regional wall motion abnormalities. The left  ventricular internal cavity size was  normal in size. There is mild left ventricular hypertrophy. Left  ventricular diastolic parameters are indeterminate.      LV Wall Scoring:  The mid anterior segment is akinetic. The mid and distal lateral wall, mid  and distal anterior septum, mid inferoseptal segment, apical anterior  segment, and apical inferior segment are hypokinetic.   Right Ventricle: The right ventricular size is normal. No increase in  right ventricular wall thickness. Right ventricular systolic function is  normal. There is normal pulmonary artery systolic pressure. The tricuspid  regurgitant velocity is 2.16 m/s, and   with an assumed right atrial pressure of 3 mmHg, the estimated right  ventricular systolic pressure is 0000000 mmHg.   Left Atrium: Left atrial size  was mildly dilated.   Right Atrium: Right atrial size was mild to moderately dilated.   Pericardium: There is no evidence of pericardial effusion.   Mitral Valve: The mitral valve is grossly normal. Trivial mitral valve  regurgitation. No evidence of mitral valve stenosis.   Tricuspid Valve: The tricuspid valve is normal in  structure. Tricuspid  valve regurgitation is mild . No evidence of tricuspid stenosis.   Aortic Valve: The aortic valve is grossly normal. Aortic valve  regurgitation is trivial. Aortic regurgitation PHT measures 834 msec. No  aortic stenosis is present. Aortic valve mean gradient measures 3.0 mmHg.  Aortic valve peak gradient measures 6.2  mmHg. Aortic valve area, by VTI measures 1.93 cm.   Pulmonic Valve: The pulmonic valve was normal in structure. Pulmonic valve  regurgitation is not visualized. No evidence of pulmonic stenosis.   Aorta: The aortic root is normal in size and structure.   Venous: The inferior vena cava was not well visualized. The inferior vena  cava is normal in size with greater than 50% respiratory variability,  suggesting right atrial pressure of 3 mmHg.   IAS/Shunts: The interatrial septum was not well visualized.  _____________   History of Present Illness     Lonnie Boyd is a 80 y.o. 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  Last cardiac catheterization performed at the time of VT/VF arrest in August 2018 demonstrated no culprit lesion.  He had 20 to 30% mid left main lesion which decreased in severity as procedure progressed suggesting catheter induced spasm, 40% mid RCA was 60% PDA lesion, patent proximal to mid LAD stent with diffuse 40 to 50% in-stent restenosis, patent left circumflex stent was 40% in-stent restenosis, 65 to 70% restenosis in proximal left circumflex stent.  Echocardiogram  obtained 09/20/2016 showed EF 20 to 25%, grade 2 DD, PA peak pressure 42 mmHg, mild MR.  By September 2018, EF has improved to 45 to 50% by echo.  Ejection fraction is unchanged on repeat echocardiogram in November 2021.  More recently, patient presented in July 2022 with acute CVA.  CTA of the head and neck showed bilateral ICA stenosis in the moderate range.  MRI showed scattered right MCA infarcts.  Hemoglobin A1c was 6.5, placing the patient in the prediabetes range.  Echocardiogram obtained on 08/13/2020 showed a drop in the ejection fraction down to 30 to 35%.  Patient was discharged on triple therapy including aspirin, Plavix and Eliquis.  Unfortunately, given his severe dementia and hard of hearing, he was a very poor historian.  He was seen by Dr. Agustin Cree on 09/09/2020 who has been titrating his heart failure therapy.   He presented to [facility] ED on 09/27/2020 with chest pain that was started slightly prior to lunchtime today.  Chest pain lasted around 2 to 3 hours however he has been chest pain-free for close to 2 hours now. EKG however showed significant ST elevation with Q waves in the anterior leads.  This was significantly changed when compared to the previous EKG from July.  Unfortunately patient had taken all of his  medication that morning including the Eliquis.  Given the lack of chest pain at the time interview and last dose of Eliquis this morning, code STEMI was canceled. He was admitted to cardiology to allow for Eliquis wash out and cath at later time.   Hospital Course     Late presentation anterior STEMI: initially presented with EKG concerning for anterior STEMI in the field. Was pain free on arrival. Given recent Eliquis dose, STEMI was cancelled, hsTn >24000. Underwent cardiac cath noted above with no identified culprit lesion. Patent pLAD and Lcx stents with mild ISR in the Lcx stent, along with 80% lesion in RI to be treated medically.  -- continue plavix, statin, entresto,  statin, spiro and jardiance. No ASA given the need for Eliquis   Chronic combined CHF: known hx of NICM with EF of 25% back in 2018, felt to be stress induced. Did have improvement to 45-50%. Back down to 35% on this admission with anterior akinesis as well as mid to distal lateral wall, anteroseptal, inferoseptal, and apical inferior segments hypokinesis.  -- on entresto 24/26 BID, spiro 12.'5mg'$  daily. Toprol was held in the setting of significant bradycardia   Permanent Afib: rates are controlled, actually consistently bradycardiac, down into the 30s at times during admission. As a result his Toprol was stopped.  -- Eliquis '5mg'$  BID resumed post cath   Hx of VT/VF arrest: back in 2018. Prolonged QT on EKG (546 on 9/4, improved to 503 9/5) -- avoiding any QT prolonging meds   HLD: LDL 143 -- on atorvastatin '80mg'$  daily (recently increased at last office visit) -- recheck Lipids/LFTs in 8 weeks   Hx of CVA: on statin, plavix  General: Well developed, well nourished, male appearing in no acute distress. Head: Normocephalic, atraumatic.  Neck: Supple without bruits, JVD. Lungs:  Resp regular and unlabored, CTA. Heart: RRR, S1, S2, no S3, S4, or murmur; no rub. Abdomen: Soft, non-tender, non-distended with normoactive bowel sounds. No hepatomegaly. No rebound/guarding. No obvious abdominal masses. Extremities: No clubbing, cyanosis, edema. Distal pedal pulses are 2+ bilaterally. Right radial/femoral cath site stable without bruising or hematoma Neuro: Alert and oriented X 3. Moves all extremities spontaneously. Psych: Normal affect.   Patient was seen by Dr. Martinique and deemed stable for discharge home. Follow up in the office has been arranged. Medications sent to [place] prior to discharge. Educated by PharmD prior to discharge.   Did the patient have an acute coronary syndrome (MI, NSTEMI, STEMI, etc) this admission?:  Yes                               AHA/ACC Clinical Performance &  Quality Measures: Aspirin prescribed? - No - No intervention, on Eliquis ADP Receptor Inhibitor (Plavix/Clopidogrel, Brilinta/Ticagrelor or Effient/Prasugrel) prescribed (includes medically managed patients)? - Yes Beta Blocker prescribed? - No - bradycardia  High Intensity Statin (Lipitor 40-'80mg'$  or Crestor 20-'40mg'$ ) prescribed? - Yes EF assessed during THIS hospitalization? - Yes For EF <40%, was ACEI/ARB prescribed? - Yes For EF <40%, Aldosterone Antagonist (Spironolactone or Eplerenone) prescribed? - Yes Cardiac Rehab Phase II ordered (including medically managed patients)? - No - No intervention, or culprit lesion noted      _____________  Discharge Vitals Blood pressure 120/84, pulse 72, temperature 97.7 F (36.5 C), temperature source Oral, resp. rate (!) 21, height '5\' 8"'$  (1.727 m), weight 74.8 kg, SpO2 97 %.  Filed Weights   09/27/20 1607  Weight:  74.8 kg    Labs & Radiologic Studies    CBC Recent Labs    09/30/20 0034 10/01/20 0104  WBC 9.4 10.5  HGB 15.7 15.5  HCT 45.0 46.6  MCV 96.2 97.3  PLT 236 [id number]   Basic Metabolic Panel Recent Labs    09/30/20 1132 10/01/20 0104  NA 135 135  K 4.5 4.4  CL 107 106  CO2 21* 22  GLUCOSE 81 102*  BUN 25* 26*  CREATININE 1.04 1.06  CALCIUM 8.9 8.7*   Liver Function Tests No results for input(s): AST, ALT, ALKPHOS, BILITOT, PROT, ALBUMIN in the last 72 hours. No results for input(s): LIPASE, AMYLASE in the last 72 hours. High Sensitivity Troponin:   Recent Labs  Lab 09/27/20 1611 09/27/20 1808  TROPONINIHS 5,942* >24,000*    BNP Invalid input(s): POCBNP D-Dimer No results for input(s): DDIMER in the last 72 hours. Hemoglobin A1C No results for input(s): HGBA1C in the last 72 hours. Fasting Lipid Panel No results for input(s): CHOL, HDL, LDLCALC, TRIG, CHOLHDL, LDLDIRECT in the last 72 hours. Thyroid Function Tests No results for input(s): TSH, T4TOTAL, T3FREE, THYROIDAB in the last 72 hours.  Invalid  input(s): FREET3 _____________  CARDIAC CATHETERIZATION  Result Date: 09/30/2020 Images from the original result were not included.   Prox RCA to Mid RCA lesion is 45% stenosed.   Dist RCA lesion is 30% stenosed.   Prox Cx to Mid Cx lesion is 40% stenosed.   2nd Mrg lesion is 40% stenosed.   Ramus lesion is 80% stenosed.   Previously placed Prox LAD to Mid LAD stent (unknown type) is  widely patent. Lonnie Boyd is a 80 y.o. male  [id number] LOCATION:  FACILITY: [facility] PHYSICIAN: [name], M.D. 1940-06-08 DATE OF PROCEDURE:  09/30/2020 DATE OF DISCHARGE: CARDIAC CATHETERIZATION History obtained from chart review.80 y.o. male with CAD status post PCI (LAD and left circumflex in 2011) VT/VF arrest [id number], chronic systolic diastolic heart failure, tachycardia, hyperlipidemia, stroke, permanent atrial fibrillation admitted with delayed presentation anterior STEMI.  His ejection fraction is in the 30% range, unchanged from prior echoes, his troponins increased to over 24,000.  He has had no recurrent chest pain.   Lonnie Boyd has patent proximal LAD and circumflex stents.  He does have mild in-stent restenosis within the circumflex stent.  He has nonobstructive disease in his mid and distal dominant RCA.  There is no obvious "culprit lesion" requiring intervention.  I reviewed the films with Dr. Martinique, his attending cardiologist who agrees.  Medical therapy will be recommended.  A right common femoral angiogram was performed and a Mynx closure device successfully deployed in the right common femoral artery.  The patient left lab in stable condition. [name]. MD, [facility] 09/30/2020 10:59 AM    DG Chest Port 1 View  Result Date: 09/27/2020 CLINICAL DATA:  Chest pain EXAM: PORTABLE CHEST 1 VIEW COMPARISON:  Radiograph 02/06/2020, chest CT 09/26/2006 FINDINGS: Unchanged, enlarged cardiac silhouette. There is no new focal airspace disease. There is no large pleural effusion or visible pneumothorax. Gas distended  large hiatal hernia. Bilateral shoulder degenerative changes. There is no acute osseous abnormality. IMPRESSION: Unchanged, enlarged cardiac silhouette. No new focal airspace disease. Gas distended large hiatal hernia. Electronically Signed   By: Maurine Simmering M.D.   On: 09/27/2020 16:33   ECHOCARDIOGRAM COMPLETE  Result Date: 09/28/2020    ECHOCARDIOGRAM REPORT   Patient Name:   Lonnie Boyd Date of Exam: 09/28/2020 Medical Rec #:  [id number]  Height:       68.0 in Accession #:    [id number]      Weight:       165.0 lb Date of Birth:  May 20, 1940        BSA:          1.883 m Patient Age:    59 years        BP:           129/89 mmHg Patient Gender: M               HR:           59 bpm. Exam Location:  Inpatient Procedure: 2D Echo, Cardiac Doppler and Color Doppler                                 MODIFIED REPORT:  This report was modified by Cherlynn Kaiser MD on 09/28/2020 due to measurement                                    revision.  Indications:     Chest pain  History:         Patient has prior history of Echocardiogram examinations, most                  recent 08/21/2020. Cardiomyopathy, CAD and Previous Myocardial                  Infarction, COPD; Arrythmias:Atrial Fibrillation. 09/20/2016                  Left heart cath.  Sonographer:     [place] Referring Phys:  [id number] HAO MENG Diagnosing Phys: Cherlynn Kaiser MD IMPRESSIONS  1. Left ventricular ejection fraction, by estimation, is 30 to 35%. The left ventricle has moderately decreased function. The left ventricle demonstrates regional wall motion abnormalities (see scoring diagram/findings for description). There is mild left ventricular hypertrophy. Left ventricular diastolic parameters are indeterminate.  2. Right ventricular systolic function is normal. The right ventricular size is normal. There is normal pulmonary artery systolic pressure. The estimated right ventricular systolic pressure is 0000000 mmHg.  3. Left atrial size was mildly  dilated.  4. Right atrial size was mild to moderately dilated.  5. The mitral valve is grossly normal. Trivial mitral valve regurgitation. No evidence of mitral stenosis.  6. The aortic valve is grossly normal. Aortic valve regurgitation is trivial. No aortic stenosis is present.  7. The inferior vena cava is normal in size with greater than 50% respiratory variability, suggesting right atrial pressure of 3 mmHg. FINDINGS  Left Ventricle: Left ventricular ejection fraction, by estimation, is 30 to 35%. The left ventricle has moderately decreased function. The left ventricle demonstrates regional wall motion abnormalities. The left ventricular internal cavity size was normal in size. There is mild left ventricular hypertrophy. Left ventricular diastolic parameters are indeterminate.  LV Wall Scoring: The mid anterior segment is akinetic. The mid and distal lateral wall, mid and distal anterior septum, mid inferoseptal segment, apical anterior segment, and apical inferior segment are hypokinetic. Right Ventricle: The right ventricular size is normal. No increase in right ventricular wall thickness. Right ventricular systolic function is normal. There is normal pulmonary artery systolic pressure. The tricuspid regurgitant velocity is 2.16 m/s, and  with an assumed right atrial pressure of 3 mmHg,  the estimated right ventricular systolic pressure is 0000000 mmHg. Left Atrium: Left atrial size was mildly dilated. Right Atrium: Right atrial size was mild to moderately dilated. Pericardium: There is no evidence of pericardial effusion. Mitral Valve: The mitral valve is grossly normal. Trivial mitral valve regurgitation. No evidence of mitral valve stenosis. Tricuspid Valve: The tricuspid valve is normal in structure. Tricuspid valve regurgitation is mild . No evidence of tricuspid stenosis. Aortic Valve: The aortic valve is grossly normal. Aortic valve regurgitation is trivial. Aortic regurgitation PHT measures 834 msec. No  aortic stenosis is present. Aortic valve mean gradient measures 3.0 mmHg. Aortic valve peak gradient measures 6.2 mmHg. Aortic valve area, by VTI measures 1.93 cm. Pulmonic Valve: The pulmonic valve was normal in structure. Pulmonic valve regurgitation is not visualized. No evidence of pulmonic stenosis. Aorta: The aortic root is normal in size and structure. Venous: The inferior vena cava was not well visualized. The inferior vena cava is normal in size with greater than 50% respiratory variability, suggesting right atrial pressure of 3 mmHg. IAS/Shunts: The interatrial septum was not well visualized.  LEFT VENTRICLE PLAX 2D LVIDd:         5.40 cm      Diastology LVIDs:         3.80 cm      LV e' medial: 3.37 cm/s LV PW:         1.00 cm LV IVS:        1.40 cm LVOT diam:     2.25 cm LV SV:         49 LV SV Index:   26 LVOT Area:     3.98 cm  LV Volumes (MOD) LV vol d, MOD A2C: 124.0 ml LV vol d, MOD A4C: 110.0 ml LV vol s, MOD A2C: 71.0 ml LV vol s, MOD A4C: 68.0 ml LV SV MOD A2C:     53.0 ml LV SV MOD A4C:     110.0 ml LV SV MOD BP:      50.2 ml RIGHT VENTRICLE RV Basal diam:  3.40 cm RV Mid diam:    2.00 cm RV S prime:     8.38 cm/s TAPSE (M-mode): 1.5 cm LEFT ATRIUM             Index       RIGHT ATRIUM           Index LA diam:        3.80 cm 2.02 cm/m  RA Area:     23.40 cm LA Vol (A2C):   46.1 ml 24.48 ml/m RA Volume:   71.70 ml  38.07 ml/m LA Vol (A4C):   80.7 ml 42.85 ml/m LA Biplane Vol: 61.4 ml 32.60 ml/m  AORTIC VALVE                   PULMONIC VALVE AV Area (Vmax):    1.95 cm    PV Vmax:       0.66 m/s AV Area (Vmean):   1.86 cm    PV Vmean:      42.800 cm/s AV Area (VTI):     1.93 cm    PV VTI:        0.133 m AV Vmax:           124.00 cm/s PV Peak grad:  1.7 mmHg AV Vmean:          83.800 cm/s PV Mean grad:  1.0 mmHg AV VTI:  0.251 m AV Peak Grad:      6.2 mmHg AV Mean Grad:      3.0 mmHg LVOT Vmax:         60.90 cm/s LVOT Vmean:        39.200 cm/s LVOT VTI:          0.122 m LVOT/AV  VTI ratio: 0.49 AI PHT:            834 msec  AORTA Ao Root diam: 3.60 cm Ao Asc diam:  3.30 cm TRICUSPID VALVE TR Peak grad:   18.7 mmHg TR Vmax:        216.00 cm/s  SHUNTS Systemic VTI:  0.12 m Systemic Diam: 2.25 cm Cherlynn Kaiser MD Electronically signed by Cherlynn Kaiser MD Signature Date/Time: 09/28/2020/11:59:38 AM    Final (Updated)    Disposition   Pt is being discharged home today in good condition.  Follow-up Plans & Appointments     Follow-up Information     Park Liter, MD Follow up on 10/17/2020.   Specialty: Cardiology Why: at 11am for your follow up appt Contact information: [place] [address] [phone]                Discharge Instructions     Amb Referral to Cardiac Rehabilitation   Complete by: As directed    Referring to [place] [facility] 2   Diagnosis: STEMI   After initial evaluation and assessments completed: Virtual Based Care may be provided alone or in conjunction with Phase 2 Cardiac Rehab based on patient barriers.: Yes   Call MD for:  difficulty breathing, headache or visual disturbances   Complete by: As directed    Call MD for:  persistant dizziness or light-headedness   Complete by: As directed    Call MD for:  redness, tenderness, or signs of infection (pain, swelling, redness, odor or green/yellow discharge around incision site)   Complete by: As directed    Diet - low sodium heart healthy   Complete by: As directed    Discharge instructions   Complete by: As directed    Radial Site Care Refer to this sheet in the next few weeks. These instructions provide you with information on caring for yourself after your procedure. Your caregiver may also give you more specific instructions. Your treatment has been planned according to current medical practices, but problems sometimes occur. Call your caregiver if you have any problems or questions after your procedure. HOME CARE INSTRUCTIONS You may shower the day after the  procedure. Remove the bandage (dressing) and gently wash the site with plain soap and water. Gently pat the site dry.  Do not apply powder or lotion to the site.  Do not submerge the affected site in water for 3 to 5 days.  Inspect the site at least twice daily.  Do not flex or bend the affected arm for 24 hours.  No lifting over 5 pounds (2.3 kg) for 5 days after your procedure.  Do not drive home if you are discharged the same day of the procedure. Have someone else drive you.  You may drive 24 hours after the procedure unless otherwise instructed by your caregiver.  What to expect: Any bruising will usually fade within 1 to 2 weeks.  Blood that collects in the tissue (hematoma) may be painful to the touch. It should usually decrease in size and tenderness within 1 to 2 weeks.  SEEK IMMEDIATE MEDICAL CARE IF: You have unusual pain  at the radial site.  You have redness, warmth, swelling, or pain at the radial site.  You have drainage (other than a small amount of blood on the dressing).  You have chills.  You have a fever or persistent symptoms for more than 72 hours.  You have a fever and your symptoms suddenly get worse.  Your arm becomes pale, cool, tingly, or numb.  You have heavy bleeding from the site. Hold pressure on the site.   Groin Site Care Refer to this sheet in the next few weeks. These instructions provide you with information on caring for yourself after your procedure. Your caregiver may also give you more specific instructions. Your treatment has been planned according to current medical practices, but problems sometimes occur. Call your caregiver if you have any problems or questions after your procedure. HOME CARE INSTRUCTIONS You may shower 24 hours after the procedure. Remove the bandage (dressing) and gently wash the site with plain soap and water. Gently pat the site dry.  Do not apply powder or lotion to the site.  Do not sit in a bathtub, swimming pool, or  whirlpool for 5 to 7 days.  No bending, squatting, or lifting anything over 10 pounds (4.5 kg) as directed by your caregiver.  Inspect the site at least twice daily.  Do not drive home if you are discharged the same day of the procedure. Have someone else drive you.  You may drive 24 hours after the procedure unless otherwise instructed by your caregiver.  What to expect: Any bruising will usually fade within 1 to 2 weeks.  Blood that collects in the tissue (hematoma) may be painful to the touch. It should usually decrease in size and tenderness within 1 to 2 weeks.  SEEK IMMEDIATE MEDICAL CARE IF: You have unusual pain at the groin site or down the affected leg.  You have redness, warmth, swelling, or pain at the groin site.  You have drainage (other than a small amount of blood on the dressing).  You have chills.  You have a fever or persistent symptoms for more than 72 hours.  You have a fever and your symptoms suddenly get worse.  Your leg becomes pale, cool, tingly, or numb.  You have heavy bleeding from the site. Hold pressure on the site. .   Increase activity slowly   Complete by: As directed        Discharge Medications   Allergies as of 10/01/2020       Reactions   Ace Inhibitors Cough        Medication List     STOP taking these medications    losartan 50 MG tablet Commonly known as: COZAAR   metoprolol succinate 25 MG 24 hr tablet Commonly known as: TOPROL-XL   NEXIUM 24HR PO       TAKE these medications    albuterol 108 (90 Base) MCG/ACT inhaler Commonly known as: VENTOLIN HFA Inhale 2 puffs into the lungs every 6 (six) hours as needed for wheezing or shortness of breath.   albuterol (2.5 MG/3ML) 0.083% nebulizer solution Commonly known as: PROVENTIL Take 2.5 mg by nebulization every 4 (four) hours as needed for shortness of breath.   ALPRAZolam 0.25 MG tablet Commonly known as: XANAX Take 0.25 mg by mouth 2 (two) times daily as needed for  anxiety or sleep. for anxiety   apixaban 5 MG Tabs tablet Commonly known as: ELIQUIS Take 5 mg by mouth 2 (two) times daily.   atorvastatin  80 MG tablet Commonly known as: LIPITOR Take 80 mg by mouth daily.   clopidogrel 75 MG tablet Commonly known as: PLAVIX Take 1 tablet (75 mg total) by mouth daily.   donepezil 5 MG tablet Commonly known as: ARICEPT Take 5 mg by mouth at bedtime.   empagliflozin 10 MG Tabs tablet Commonly known as: JARDIANCE Take 1 tablet (10 mg total) by mouth daily. Start taking on: October 02, 2020   memantine 10 MG tablet Commonly known as: NAMENDA Take 10 mg by mouth 2 (two) times daily.   nitroGLYCERIN 0.4 MG SL tablet Commonly known as: NITROSTAT Place 0.4 mg under the tongue every 5 (five) minutes as needed for chest pain.   pantoprazole 40 MG tablet Commonly known as: PROTONIX Take 40 mg by mouth daily.   sacubitril-valsartan 24-26 MG Commonly known as: ENTRESTO Take 1 tablet by mouth 2 (two) times daily.   spironolactone 25 MG tablet Commonly known as: ALDACTONE Take 1 tablet (25 mg total) by mouth daily. Start taking on: October 02, 2020           Outstanding Labs/Studies   N/a   Duration of Discharge Encounter   Greater than 30 minutes including physician time.  Signed, Reino Bellis, NP 10/01/2020, 11:41 AM

## 2020-10-01 NOTE — Progress Notes (Signed)
CARDIAC REHAB PHASE I   PRE:  Rate/Rhythm: 69 afib  BP:  Supine: 115/85  Sitting:   Standing:    SaO2: 96%R  MODE:  Ambulation: 470 ft   POST:  Rate/Rhythm: 90 afib  BP:  Supine:   Sitting: 127/86  Standing:    SaO2: 97%RA 1055-1145 Pt walked 470 ft on RA with gait belt use but I did not hold to pt. He was a little wobbly but steadied self. Pt's son stated his brother would be with pt at discharge. Encouraged someone to walk bedside pt as he has been in bed for a few days. This was the first walk in hall. No CP. Gave MI booklet and heart healthy diet to son. Discussed MI restrictions,NTG use,and CRP 2. Referred to [place] CRP 2. To recliner with call bell and chair alarm. Son voiced understanding of ed.    Graylon Good, RN BSN  10/01/2020 11:39 AM

## 2020-10-03 ENCOUNTER — Telehealth (HOSPITAL_COMMUNITY): Payer: Self-pay

## 2020-10-03 NOTE — Telephone Encounter (Signed)
Per phase I cardiac rehab, fax cardiac rehab referral to [facility] cardiac rehab.

## 2020-10-07 DIAGNOSIS — I5042 Chronic combined systolic (congestive) and diastolic (congestive) heart failure: Secondary | ICD-10-CM | POA: Diagnosis not present

## 2020-10-07 DIAGNOSIS — E261 Secondary hyperaldosteronism: Secondary | ICD-10-CM | POA: Diagnosis not present

## 2020-10-07 DIAGNOSIS — I213 ST elevation (STEMI) myocardial infarction of unspecified site: Secondary | ICD-10-CM | POA: Diagnosis not present

## 2020-10-14 DIAGNOSIS — I2109 ST elevation (STEMI) myocardial infarction involving other coronary artery of anterior wall: Secondary | ICD-10-CM | POA: Diagnosis not present

## 2020-10-14 DIAGNOSIS — I5042 Chronic combined systolic (congestive) and diastolic (congestive) heart failure: Secondary | ICD-10-CM | POA: Diagnosis not present

## 2020-10-14 DIAGNOSIS — I480 Paroxysmal atrial fibrillation: Secondary | ICD-10-CM | POA: Diagnosis not present

## 2020-10-14 DIAGNOSIS — I2510 Atherosclerotic heart disease of native coronary artery without angina pectoris: Secondary | ICD-10-CM | POA: Diagnosis not present

## 2020-10-14 DIAGNOSIS — I252 Old myocardial infarction: Secondary | ICD-10-CM | POA: Diagnosis not present

## 2020-10-14 DIAGNOSIS — Z23 Encounter for immunization: Secondary | ICD-10-CM | POA: Diagnosis not present

## 2020-10-14 DIAGNOSIS — E785 Hyperlipidemia, unspecified: Secondary | ICD-10-CM | POA: Diagnosis not present

## 2020-10-17 ENCOUNTER — Ambulatory Visit: Payer: PPO | Admitting: Cardiology

## 2020-10-20 ENCOUNTER — Telehealth: Payer: Self-pay | Admitting: Cardiology

## 2020-10-20 MED ORDER — SACUBITRIL-VALSARTAN 24-26 MG PO TABS: 1.0000 | ORAL_TABLET | Freq: Two times a day (BID) | ORAL | 3 refills | Status: AC

## 2020-10-20 MED ORDER — SPIRONOLACTONE 25 MG PO TABS: 25.0000 mg | ORAL_TABLET | Freq: Every day | ORAL | 3 refills | Status: AC

## 2020-10-20 MED ORDER — EMPAGLIFLOZIN 10 MG PO TABS: 10.0000 mg | ORAL_TABLET | Freq: Every day | ORAL | 3 refills | Status: AC

## 2020-10-20 NOTE — Telephone Encounter (Signed)
  *  STAT* If patient is at the pharmacy, call can be transferred to refill team.   1. Which medications need to be refilled? (please list name of each medication and dose if known)   spironolactone (ALDACTONE) 25 MG tablet    sacubitril-valsartan (ENTRESTO) 24-26 MG    empagliflozin (JARDIANCE) 10 MG TABS tablet    2. Which pharmacy/location (including street and city if local pharmacy) is medication to be sent to? [place], [place]  3. Do they need a 30 day or 90 day supply? 90 days  Pt ran out of meds, needs refill today

## 2020-10-21 ENCOUNTER — Encounter: Payer: Self-pay | Admitting: Cardiology

## 2020-10-21 ENCOUNTER — Other Ambulatory Visit: Payer: Self-pay

## 2020-10-21 ENCOUNTER — Ambulatory Visit: Payer: PPO | Admitting: Cardiology

## 2020-10-21 VITALS — BP 112/76 | HR 86 | Ht 68.0 in | Wt 148.8 lb

## 2020-10-21 DIAGNOSIS — I4821 Permanent atrial fibrillation: Secondary | ICD-10-CM | POA: Diagnosis not present

## 2020-10-21 DIAGNOSIS — E785 Hyperlipidemia, unspecified: Secondary | ICD-10-CM | POA: Diagnosis not present

## 2020-10-21 DIAGNOSIS — I255 Ischemic cardiomyopathy: Secondary | ICD-10-CM | POA: Diagnosis not present

## 2020-10-21 DIAGNOSIS — I6930 Unspecified sequelae of cerebral infarction: Secondary | ICD-10-CM | POA: Diagnosis not present

## 2020-10-21 NOTE — Progress Notes (Signed)
Cardiology Office Note:    Date:  10/21/2020   ID:  Lonnie Boyd, DOB [birth year], MRN [id number]  PCP:  Lonnie Dress, MD  Cardiologist:  Lonnie Campus, MD    Referring MD: Lonnie Dress, MD   No chief complaint on file.   History of Present Illness:    Lonnie Boyd is a 80 y.o. male is a patient of mine with very complex past medical history.  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  Cardiac catheterization at the time did not show any significant coronary artery disease.  Apparently in the summer of this year in July 2020 he suffered from acute CVA.  CTA of his neck and neck showed bilateral moderate stenosis of internal carotic arteries.  MRA showed scattered right MCA infarct.  He came to [facility] at the beginning of September with chest pain.  His EKG showed already Q waves anteriorly with ST segment elevations.  Since he was asymptomatic he was not rushed to cardiac catheterization.  Eventually cardiac catheterization being done which showed 45% stenosis mid RCA, distal RCA got 30% stenosis, proximal and mid circumflex lesion up to 40% second obtuse marginal branch had 40% stenosis ramus had 80% stenosis previously stented LAD a it at mid and proximal portion was still patent.  No intervention has been required.  At the same time he was found to have cardiomyopathy which is ischemic in origin with ejection fraction 30 to 35%.  He is coming today to my office for follow-up Overall he is doing well he denies have any chest pain tightness squeezing pressure burning chest he tells me he is doing well he comes with his daughter-in-law.  He still pedal around a little I have no difficulty doing it.  Past Medical History:  Diagnosis Date   Acute blood loss anemia    Adenocarcinoma of kidney (HCC)    AKI  (acute kidney injury) ([place])    Anemia-HEME positive    Anxiety    CAD (coronary artery disease)    CAD- prior PCIs- cath- moderate CAD 09/22/16    Cardiac arrest with ventricular fibrillation ([place])    Cardiomyopathy ([place])    Chronic GERD    Chronic insomnia    Chronic obstructive asthma with acute exacerbation (HCC)    Dementia ([place]) 09/04/2019   Dizziness 06/23/2017   Dyslipidemia 05/15/2015   Falls 05/24/2017   Gastrointestinal hemorrhage    Hyperlipemia    Ischemic cardiomyopathy    Late onset 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 (HCC)    Multiple closed fractures of ribs of both sides    Myocardial infarct [facility])    With stent to LAD/CX   NSTEMI (non-ST elevated myocardial infarction) ([place]) 09/20/2016   OSA (obstructive sleep apnea) 05/04/2019   PAF (paroxysmal atrial fibrillation) ([place]) 09/25/2016   Permanent atrial fibrillation ([place]) 03/01/2019   Torsades de pointes (HCC)    Ventral hernia     Past Surgical History:  Procedure Laterality Date   CATARACT EXTRACTION Bilateral 2019   INGUINAL HERNIA REPAIR Bilateral    LEFT HEART CATH AND CORONARY ANGIOGRAPHY N/A 09/20/2016   Procedure: LEFT HEART CATH AND CORONARY ANGIOGRAPHY;  Surgeon: Belva Crome, MD;  Location: [place] [facility];  Service: Cardiovascular;  Laterality: N/A;   LEFT HEART CATH AND CORONARY ANGIOGRAPHY N/A 09/30/2020  Procedure: LEFT HEART CATH AND CORONARY ANGIOGRAPHY;  Surgeon: Lorretta Harp, MD;  Location: [place] [facility];  Service: Cardiovascular;  Laterality: N/A;   PARTIAL NEPHRECTOMY Left [facility]   TONSILLECTOMY  [id number]   UMBILICAL HERNIA REPAIR  02/2017    Current Medications: No outpatient medications have been marked as taking for the 10/21/20 encounter (Appointment) with Park Liter, MD.     Allergies:   Ace inhibitors   Social History   Socioeconomic History   Marital status: Married    Spouse name: Not on file    Number of children: Not on file   Years of education: Not on file   Highest education level: Not on file  Occupational History   Not on file  Tobacco Use   Smoking status: Never   Smokeless tobacco: Never  Vaping Use   Vaping Use: Never used  Substance and Sexual Activity   Alcohol use: No   Drug use: No   Sexual activity: Not on file  Other Topics Concern   Not on file  Social History Narrative   Not on file   Social Determinants of Health   Financial Resource Strain: Not on file  Food Insecurity: Not on file  Transportation Needs: Not on file  Physical Activity: Not on file  Stress: Not on file  Social Connections: Not on file     Family History: The patient's family history includes Breast cancer in his mother; Heart failure in his father; Hypertension in his father; Leukemia in his sister; Prostate cancer in his father; Tuberculosis in his father. ROS:   Please see the history of present illness.    All 14 point review of systems negative except as described per history of present illness  EKGs/Labs/Other Studies Reviewed:      Recent Labs: 09/27/2020: ALT 26; B Natriuretic Peptide 558.2 10/01/2020: BUN 26; Creatinine, Ser 1.06; Hemoglobin 15.5; Platelets 267; Potassium 4.4; Sodium 135  Recent Lipid Panel    Component Value Date/Time   CHOL 193 09/28/2020 0502   TRIG 88 09/28/2020 0502   HDL 32 (L) 09/28/2020 0502   CHOLHDL 6.0 09/28/2020 0502   VLDL 18 09/28/2020 0502   LDLCALC 143 (H) 09/28/2020 0502    Physical Exam:    VS:  There were no vitals taken for this visit.    Wt Readings from Last 3 Encounters:  09/27/20 165 lb (74.8 kg)  09/09/20 160 lb 3.2 oz (72.7 kg)  08/25/20 159 lb (72.1 kg)     GEN:  Well nourished, well developed in no acute distress HEENT: Normal NECK: No JVD; No carotid bruits LYMPHATICS: No lymphadenopathy CARDIAC: RRR, no murmurs, no rubs, no gallops RESPIRATORY:  Clear to auscultation without rales, wheezing or rhonchi   ABDOMEN: Soft, non-tender, non-distended MUSCULOSKELETAL:  No edema; No deformity  SKIN: Warm and dry LOWER EXTREMITIES: no swelling NEUROLOGIC:  Alert and oriented x 3 PSYCHIATRIC:  Normal affect   ASSESSMENT:    1. Ischemic cardiomyopathy   2. Permanent atrial fibrillation ([place])   3. Dyslipidemia   4. Late effect of cerebrovascular accident (CVA)    PLAN:    In order of problems listed above:  Ischemic cardiomyopathy with ejection fraction 30 to 35%.  He is on Entresto small dose blood pressure is low which prevented me from giving him more of this medications.  I will do EKG today if EKG is fine I will give him very small dose of  beta-blocker.  He does have COPD lung problems with some wheezes therefore we need to be careful. Permanent atrial fibrillation he is anticoagulated which I will continue.  He was on triple therapy but now he is only on Plavix and Eliquis which I will continue for now. Dyslipidemia he is on high intense statin he takes 80 mg of Lipitor I will continue he will have fasting lipid profile done in about a month Late effect of CVA.  Seems to be stable from that point  I did review extensive record from the hospital as well as from prior visits   Medication Adjustments/Labs and Tests Ordered: Current medicines are reviewed at length with the patient today.  Concerns regarding medicines are outlined above.  No orders of the defined types were placed in this encounter.  Medication changes: No orders of the defined types were placed in this encounter.   Signed, Park Liter, MD, [facility] 10/21/2020 3:55 PM    [place]

## 2020-10-21 NOTE — Patient Instructions (Signed)
Medication Instructions:  Your physician recommends that you continue on your current medications as directed. Please refer to the Current Medication list given to you today.  *If you need a refill on your cardiac medications before your next appointment, please call your pharmacy*   Lab Work: None If you have labs (blood work) drawn today and your tests are completely normal, you will receive your results only by: [place] (if you have MyChart) OR A paper copy in the mail If you have any lab test that is abnormal or we need to change your treatment, we will call you to review the results.   Testing/Procedures: None   Follow-Up: At [facility], you and your health needs are our priority.  As part of our continuing mission to provide you with exceptional heart care, we have created designated Provider Care Teams.  These Care Teams include your primary Cardiologist (physician) and Advanced Practice Providers (APPs -  Physician Assistants and Nurse Practitioners) who all work together to provide you with the care you need, when you need it.  We recommend signing up for the patient portal called "MyChart".  Sign up information is provided on this After Visit Summary.  MyChart is used to connect with patients for Virtual Visits (Telemedicine).  Patients are able to view lab/test results, encounter notes, upcoming appointments, etc.  Non-urgent messages can be sent to your provider as well.   To learn more about what you can do with MyChart, go to [email or web address].    Your next appointment:   3 month   The format for your next appointment:   In Person  Provider:   Jenne Campus, MD   Other Instructions

## 2020-10-24 ENCOUNTER — Other Ambulatory Visit: Payer: Self-pay | Admitting: Cardiology

## 2020-10-24 NOTE — Telephone Encounter (Signed)
Prescription refill request for Eliquis received. Indication:atrial fib Last office visit:9/22 Scr:1.0 Age: 80 Weight:67.5 kg  Prescription refilled

## 2020-10-25 DIAGNOSIS — R296 Repeated falls: Secondary | ICD-10-CM | POA: Diagnosis not present

## 2020-10-28 ENCOUNTER — Encounter: Payer: Self-pay | Admitting: Adult Health

## 2020-10-28 ENCOUNTER — Ambulatory Visit: Payer: PPO | Admitting: Adult Health

## 2020-10-28 VITALS — BP 115/79 | HR 78 | Ht 68.0 in | Wt 148.0 lb

## 2020-10-28 DIAGNOSIS — E785 Hyperlipidemia, unspecified: Secondary | ICD-10-CM

## 2020-10-28 DIAGNOSIS — I4821 Permanent atrial fibrillation: Secondary | ICD-10-CM | POA: Diagnosis not present

## 2020-10-28 DIAGNOSIS — I10 Essential (primary) hypertension: Secondary | ICD-10-CM

## 2020-10-28 DIAGNOSIS — R7303 Prediabetes: Secondary | ICD-10-CM

## 2020-10-28 DIAGNOSIS — I63411 Cerebral infarction due to embolism of right middle cerebral artery: Secondary | ICD-10-CM | POA: Diagnosis not present

## 2020-10-28 NOTE — Progress Notes (Signed)
[facility] [address] [place]. [place] [address] [phone]       HOSPITAL FOLLOW UP NOTE  Mr. Lonnie Boyd Date of Birth:  [birth year] Medical Record Number:  [id number]   Reason for Referral:  hospital stroke follow up    SUBJECTIVE:   CHIEF COMPLAINT:  Chief Complaint  Patient presents with   Follow-up    RM 3 with son Lonnie Boyd PT is well and stable form stoke standpoint, did have cardiac arrest after stroke     HPI:   Lonnie Boyd is a 80 year old male with history of CAD/non-STEMI on Plavix, cardiac arrest with V. fib, PAF on Eliquis, cardiomyopathy, dementia, HLD, OSA presented to [facility] ED on 08/20/2020 for left-sided weakness, slurred speech, confusion and left facial droop.  Personally reviewed hospitalization pertinent progress notes, lab work and imaging.  Evaluated by Dr. Erlinda Hong.  CT no acute abnormality.  CTA head and neck showed bilateral ICA siphon moderate stenosis.  MRI showed scattered right MCA infarcts.  EF 30 to 35% down from 45 to 50% in 11/2019.  UDS negative.  LDL 126, A1c 6.5.  No prior DM diagnosis.  Creatinine 1.2.  Etiology for stroke likely due to A. fib with Eliquis noncompliance.  Recommended resuming home Eliquis and Plavix as well as initiated aspirin and increased atorvastatin from 40 mg to 80 mg daily.  History of dementia on Aricept and Namenda which was resumed.  Recommended follow-up outpatient cardiology for decreased EF.  Per exam, no focal deficit except HOH.  PT/OT no therapy needs and discharged back home.  Today, 10/28/2020, Mr. Lonnie Boyd is being seen for hospital follow-up accompanied by his son, Lonnie Boyd.  Overall stable from stroke standpoint. Denies new stroke/TIA symptoms. Son reports some worsening of baseline cognition and increased fatigue since stroke. Per son, dx'd with early stage Alzheimers about 1 year ago - some decline since then prior to stroke. Lives with his son, Lonnie Boyd, and daughter in law who assist with  medications. He was driving short distance prior to stroke - no driving since then per PCP recommendations. He questions possible return to driving. Appointment scheduled today to establish care at [facility]. Denies new stroke/TIA symptoms.  Remains on Eliquis and Plavix as well as atorvastatin without side effects.  Blood pressure today 115/79.    He was hospitalized on 09/27/2020 for late presentation anterior STEMI.  Evaluated by cardiology underwent cardiac cath but no identified culprit lesion. Routinely followed by cardiology.      PERTINENT IMAGING  CT HEAD 08/20/2020 IMPRESSION: 1. Mildly degraded by motion with no acute cortically based infarct or acute intracranial hemorrhage identified. ASPECTS 10. 2. These results were communicated to Dr. Cheral Marker at 9:15 am on 08/20/2020 by text page via the [facility] messaging system.  CTA HEAD/NECK  08/20/2020 IMPRESSION: 1. Negative for large vessel occlusion. 2. Mild atherosclerosis in the neck and no significant extracranial stenosis. 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  MR BRAIN 08/20/2020 MR ANGIO IMPRESSION: MRI HEAD IMPRESSION: 1. Patchy small volume acute ischemic nonhemorrhagic right parietal infarcts, posterior right MCA distribution. These are likely embolic in nature. 2. Underlying age-related cerebral atrophy with mild chronic small vessel ischemic disease.   MRA HEAD IMPRESSION: 1. Negative intracranial MRA for large vessel occlusion. 2. Mild to moderate atherosclerotic stenosis involving the supraclinoid left ICA. No other hemodynamically significant or correctable stenosis.  2D ECHO 08/21/2020 IMPRESSIONS   1. Severe hypokinesis of the posterior wall. The mid and distal inferior  segments are severely hypokinetic as well. Left ventricular ejection  fraction, by estimation, is 30 to 35%. Left ventricular ejection fraction  by 3D volume is 32 %. The left  ventricle has moderately  decreased function. The left ventricle  demonstrates global hypokinesis. There is moderate concentric left  ventricular hypertrophy. Left ventricular diastolic function could not be  evaluated.   2. Right ventricular systolic function is normal. The right ventricular  size is normal. There is normal pulmonary artery systolic pressure. The  estimated right ventricular systolic pressure is 45.8 mmHg.   3. The mitral valve is grossly normal. Mild mitral valve regurgitation.  No evidence of mitral stenosis.   4. The aortic valve is tricuspid. Aortic valve regurgitation is mild. No  aortic stenosis is present.   5. The inferior vena cava is normal in size with greater than 50%  respiratory variability, suggesting right atrial pressure of 3 mmHg.      ROS:   14 system review of systems performed and negative with exception of no complaints  PMH:  Past Medical History:  Diagnosis Date   Acute blood loss anemia    Adenocarcinoma of kidney (HCC)    AKI (acute kidney injury) ([place])    Anemia-HEME positive    Anxiety    CAD (coronary artery disease)    CAD- prior PCIs- cath- moderate CAD 09/22/16    Cardiac arrest with ventricular fibrillation ([place])    Cardiomyopathy ([place])    Chronic GERD    Chronic insomnia    Chronic obstructive asthma with acute exacerbation (HCC)    Dementia ([place]) 09/04/2019   Dizziness 06/23/2017   Dyslipidemia 05/15/2015   Falls 05/24/2017   Gastrointestinal hemorrhage    Hyperlipemia    Ischemic cardiomyopathy    Late onset 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 (HCC)    Multiple closed fractures of ribs of both sides    Myocardial infarct [facility])    With stent to LAD/CX   NSTEMI (non-ST elevated myocardial infarction) ([place]) 09/20/2016   OSA (obstructive sleep apnea) 05/04/2019   PAF (paroxysmal atrial fibrillation) ([place]) 09/25/2016   Permanent atrial fibrillation ([place]) 03/01/2019   Torsades de pointes    Ventral hernia     PSH:  Past  Surgical History:  Procedure Laterality Date   CATARACT EXTRACTION Bilateral 2019   INGUINAL HERNIA REPAIR Bilateral    LEFT HEART CATH AND CORONARY ANGIOGRAPHY N/A 09/20/2016   Procedure: LEFT HEART CATH AND CORONARY ANGIOGRAPHY;  Surgeon: Belva Crome, MD;  Location: [facility];  Service: Cardiovascular;  Laterality: N/A;   LEFT HEART CATH AND CORONARY ANGIOGRAPHY N/A 09/30/2020   Procedure: LEFT HEART CATH AND CORONARY ANGIOGRAPHY;  Surgeon: Lorretta Harp, MD;  Location: [facility];  Service: Cardiovascular;  Laterality: N/A;   PARTIAL NEPHRECTOMY Left [facility]   TONSILLECTOMY  [id number]   UMBILICAL HERNIA REPAIR  02/2017    Social History:  Social History   Socioeconomic History   Marital status: Married    Spouse name: Not on file   Number of children: Not on file   Years of education: Not on file   Highest education level: Not on file  Occupational History   Not on file  Tobacco Use   Smoking status: Never   Smokeless tobacco: Never  Vaping Use   Vaping Use: Never used  Substance and Sexual Activity   Alcohol use: No   Drug use: No   Sexual activity:  Not on file  Other Topics Concern   Not on file  Social History Narrative   Not on file   Social Determinants of Health   Financial Resource Strain: Not on file  Food Insecurity: Not on file  Transportation Needs: Not on file  Physical Activity: Not on file  Stress: Not on file  Social Connections: Not on file  Intimate Partner Violence: Not on file    Family History:  Family History  Problem Relation Age of Onset   Breast cancer Mother    Hypertension Father    Heart failure Father    Prostate cancer Father    Tuberculosis Father    Leukemia Sister     Medications:   Current Outpatient Medications on File Prior to Visit  Medication Sig Dispense Refill   albuterol (PROVENTIL) (2.5 MG/3ML) 0.083% nebulizer solution Take 2.5 mg by nebulization every 4  (four) hours as needed for shortness of breath.  0   albuterol (VENTOLIN HFA) 108 (90 Base) MCG/ACT inhaler Inhale 2 puffs into the lungs every 6 (six) hours as needed for wheezing or shortness of breath.     ALPRAZolam (XANAX) 0.25 MG tablet Take 0.25 mg by mouth 2 (two) times daily as needed for anxiety or sleep. for anxiety  0   atorvastatin (LIPITOR) 80 MG tablet Take 80 mg by mouth daily.     clopidogrel (PLAVIX) 75 MG tablet Take 1 tablet (75 mg total) by mouth daily. 90 tablet 1   donepezil (ARICEPT) 5 MG tablet Take 5 mg by mouth at bedtime.     ELIQUIS 5 MG TABS tablet TAKE ONE TABLET BY MOUTH TWICE DAILY 180 tablet 1   empagliflozin (JARDIANCE) 10 MG TABS tablet Take 1 tablet (10 mg total) by mouth daily. 90 tablet 3   memantine (NAMENDA) 10 MG tablet Take 10 mg by mouth 2 (two) times daily.     nitroGLYCERIN (NITROSTAT) 0.4 MG SL tablet Place 0.4 mg under the tongue every 5 (five) minutes as needed for chest pain.     pantoprazole (PROTONIX) 40 MG tablet Take 40 mg by mouth daily.     sacubitril-valsartan (ENTRESTO) 24-26 MG Take 1 tablet by mouth 2 (two) times daily. 180 tablet 3   spironolactone (ALDACTONE) 25 MG tablet Take 1 tablet (25 mg total) by mouth daily. 90 tablet 3   No current facility-administered medications on file prior to visit.    Allergies:   Allergies  Allergen Reactions   Ace Inhibitors Cough      OBJECTIVE:  Physical Exam  Vitals:   10/28/20 0917  BP: 115/79  Pulse: 78  Weight: 148 lb (67.1 kg)  Height: 5\' 8"  (1.727 m)   Body mass index is 22.5 kg/m. No results found.  Post stroke PHQ 2/9 Depression screen PHQ 2/9 10/28/2020  Decreased Interest 0  Down, Depressed, Hopeless 0  PHQ - 2 Score 0     General: well developed, well nourished, very pleasant elderly caucasian male, seated, in no evident distress Head: head normocephalic and atraumatic.   Neck: supple with no carotid or supraclavicular bruits Cardiovascular: irregular rate and  rhythm, no murmurs Musculoskeletal: no deformity Skin:  no rash/petichiae Vascular:  Normal pulses all extremities   Neurologic Exam Mental Status: Awake and fully alert.  Fluent speech and language.  Oriented to place and time. Recent memory impaired and remote memory intact. Attention span, concentration and fund of knowledge appropriate. Mood and affect appropriate.  Cranial Nerves: Fundoscopic exam reveals sharp disc  margins. Pupils equal, briskly reactive to light. Extraocular movements full without nystagmus. Visual fields full to confrontation. HOH bilaterally. Facial sensation intact. Face, tongue, palate moves normally and symmetrically.  Motor: Normal bulk and tone. Normal strength in all tested extremity muscles Sensory.: intact to touch , pinprick , position and vibratory sensation.  Coordination: Rapid alternating movements normal in all extremities. Finger-to-nose and heel-to-shin performed accurately bilaterally. Gait and Station: Arises from chair without difficulty. Stance is normal. Gait demonstrates normal stride length and mild imbalance without use of assistive device. Tandem walk and heel toe not attempted.  Reflexes: 1+ and symmetric. Toes downgoing.     NIHSS  0 Modified Rankin  0      ASSESSMENT: Lonnie Boyd is a 80 y.o. 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 fib after presenting with left-sided weakness, slurred speech, confusion and left facial droop. Vascular risk factors include HLD, HTN, CAD/non-STEMI on Plavix, cardiac arrest with V. Fib 08/2016, PAF on Eliquis, cardiomyopathy with EF 30 to 35%, dementia on Aricept and Namenda and OSA. Recent admission for STEMI on 09/27/2020     PLAN:  Right MCA stroke:  recovered well although some decline in baseline dementia and fatigue - discussed typical recovery time post stroke. Followed by PCP for dementia monitoring on aricept and namenda. Would not  recommend return to driving at this time - plans to follow up with PCP in the next 1-2 months for clearance after further recovery time Continue clopidogrel 75 mg daily and Eliquis (apixaban) daily  and atorvastatin 80 mg daily for secondary stroke prevention and hx of CAD.   Discussed secondary stroke prevention measures and importance of close PCP follow up for aggressive stroke risk factor management. I have gone over the pathophysiology of stroke, warning signs and symptoms, risk factors and their management in some detail with instructions to go to the closest emergency room for symptoms of concern. Atrial fibrillation: remains on eliquis 5mg  BID per cardiology - daughter assists with medications to ensure no missed dosages. Routinely followed by cardiology  HTN: BP goal <130/90.  Stable on Cozaar and metoprolol per PCP HLD: LDL goal <70. Recent LDL 143 (09/28/2020).  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 Recent A1c 6.2 (09/27/2020). Request routine monitoring by PCP    Follow up in 6 months or call earlier if needed   CC:  [facility] provider: Dr. Leonie Man PCP: [name], MD    I spent 56 minutes of face-to-face and non-face-to-face time with patient and son.  This included previsit chart review including review of recent hospitalization, lab review, study review, electronic health record documentation, patient and son education regarding recent stroke including etiology, secondary stroke prevention measures and importance of managing stroke risk factors, residual deficits and typical recovery time and answered all other questions to patient and sons satisfaction   Frann Rider, AGNP-BC  [facility] [address] [place], [place] [address]  Phone [phone] Fax [phone] Note: This document was prepared with digital dictation and possible smart phrase technology. Any transcriptional errors that result from this process are  unintentional.

## 2020-10-28 NOTE — Patient Instructions (Addendum)
   Continue clopidogrel 75 mg daily and Eliquis (apixaban) daily  and atorvastatin 80 mg daily for secondary stroke prevention  Routine follow-up with cardiology for ongoing monitoring and cardiac management  Continue to follow up with PCP regarding cholesterol, blood pressure and prediabetes management  Maintain strict control of hypertension with blood pressure goal below 130/90, pre-diabetes with hemoglobin A1c goal below 7.0 % and cholesterol with LDL cholesterol (bad cholesterol) goal below 70 mg/dL.    Memory impaired memory impaired memory impaired recovered well without residual deficit.  Recovered well without residual deficit.  He was diagnosed with early stage Alzheimer's he was   Followup in the future with me in 6 months or call earlier if needed       Thank you for coming to see [place] at [facility]. I hope we have been able to provide you high quality care today.  You may receive a patient satisfaction survey over the next few weeks. We would appreciate your feedback and comments so that we may continue to improve ourselves and the health of our patients.

## 2020-11-13 NOTE — Progress Notes (Signed)
I agree with the above plan 

## 2020-11-20 DIAGNOSIS — I252 Old myocardial infarction: Secondary | ICD-10-CM | POA: Diagnosis not present

## 2020-11-20 DIAGNOSIS — E261 Secondary hyperaldosteronism: Secondary | ICD-10-CM | POA: Diagnosis not present

## 2020-11-20 DIAGNOSIS — Z7901 Long term (current) use of anticoagulants: Secondary | ICD-10-CM | POA: Diagnosis not present

## 2020-11-20 DIAGNOSIS — G309 Alzheimer's disease, unspecified: Secondary | ICD-10-CM | POA: Diagnosis not present

## 2020-11-20 DIAGNOSIS — F0280 Dementia in other diseases classified elsewhere without behavioral disturbance: Secondary | ICD-10-CM | POA: Diagnosis not present

## 2020-11-20 DIAGNOSIS — I480 Paroxysmal atrial fibrillation: Secondary | ICD-10-CM | POA: Diagnosis not present

## 2020-11-20 DIAGNOSIS — Z8673 Personal history of transient ischemic attack (TIA), and cerebral infarction without residual deficits: Secondary | ICD-10-CM | POA: Diagnosis not present

## 2020-11-20 DIAGNOSIS — D6869 Other thrombophilia: Secondary | ICD-10-CM | POA: Diagnosis not present

## 2020-11-20 DIAGNOSIS — I509 Heart failure, unspecified: Secondary | ICD-10-CM | POA: Diagnosis not present

## 2020-11-21 DIAGNOSIS — Z79899 Other long term (current) drug therapy: Secondary | ICD-10-CM | POA: Diagnosis not present

## 2020-11-21 DIAGNOSIS — I2510 Atherosclerotic heart disease of native coronary artery without angina pectoris: Secondary | ICD-10-CM | POA: Diagnosis not present

## 2020-11-21 DIAGNOSIS — E785 Hyperlipidemia, unspecified: Secondary | ICD-10-CM | POA: Diagnosis not present

## 2020-11-21 DIAGNOSIS — G301 Alzheimer's disease with late onset: Secondary | ICD-10-CM | POA: Diagnosis not present

## 2020-11-21 DIAGNOSIS — F0280 Dementia in other diseases classified elsewhere without behavioral disturbance: Secondary | ICD-10-CM | POA: Diagnosis not present

## 2020-11-21 DIAGNOSIS — I252 Old myocardial infarction: Secondary | ICD-10-CM | POA: Diagnosis not present

## 2020-11-21 DIAGNOSIS — I5042 Chronic combined systolic (congestive) and diastolic (congestive) heart failure: Secondary | ICD-10-CM | POA: Diagnosis not present

## 2020-11-21 DIAGNOSIS — I480 Paroxysmal atrial fibrillation: Secondary | ICD-10-CM | POA: Diagnosis not present

## 2020-12-12 ENCOUNTER — Ambulatory Visit: Payer: PPO | Admitting: Cardiology

## 2021-01-12 ENCOUNTER — Other Ambulatory Visit: Payer: Self-pay

## 2021-01-12 ENCOUNTER — Ambulatory Visit (INDEPENDENT_AMBULATORY_CARE_PROVIDER_SITE_OTHER): Payer: No Typology Code available for payment source | Admitting: Cardiology

## 2021-01-12 VITALS — BP 124/70 | HR 75 | Ht 68.0 in | Wt 149.2 lb

## 2021-01-12 DIAGNOSIS — E785 Hyperlipidemia, unspecified: Secondary | ICD-10-CM

## 2021-01-12 DIAGNOSIS — F0280 Dementia in other diseases classified elsewhere without behavioral disturbance: Secondary | ICD-10-CM

## 2021-01-12 DIAGNOSIS — I255 Ischemic cardiomyopathy: Secondary | ICD-10-CM

## 2021-01-12 DIAGNOSIS — I25110 Atherosclerotic heart disease of native coronary artery with unstable angina pectoris: Secondary | ICD-10-CM | POA: Diagnosis not present

## 2021-01-12 DIAGNOSIS — I214 Non-ST elevation (NSTEMI) myocardial infarction: Secondary | ICD-10-CM | POA: Diagnosis not present

## 2021-01-12 DIAGNOSIS — I480 Paroxysmal atrial fibrillation: Secondary | ICD-10-CM

## 2021-01-12 DIAGNOSIS — G301 Alzheimer's disease with late onset: Secondary | ICD-10-CM

## 2021-01-12 NOTE — Patient Instructions (Signed)
Medication Instructions:  Your physician recommends that you continue on your current medications as directed. Please refer to the Current Medication list given to you today.  *If you need a refill on your cardiac medications before your next appointment, please call your pharmacy*   Lab Work: NONE If you have labs (blood work) drawn today and your tests are completely normal, you will receive your results only by: [place] (if you have MyChart) OR A paper copy in the mail If you have any lab test that is abnormal or we need to change your treatment, we will call you to review the results.   Testing/Procedures: NONE   Follow-Up: At [facility], you and your health needs are our priority.  As part of our continuing mission to provide you with exceptional heart care, we have created designated Provider Care Teams.  These Care Teams include your primary Cardiologist (physician) and Advanced Practice Providers (APPs -  Physician Assistants and Nurse Practitioners) who all work together to provide you with the care you need, when you need it.  We recommend signing up for the patient portal called "MyChart".  Sign up information is provided on this After Visit Summary.  MyChart is used to connect with patients for Virtual Visits (Telemedicine).  Patients are able to view lab/test results, encounter notes, upcoming appointments, etc.  Non-urgent messages can be sent to your provider as well.   To learn more about what you can do with MyChart, go to [email or web address].    Your next appointment:   5 month(s)  The format for your next appointment:   In Person  Provider:   Jenne Campus, MD    Other Instructions

## 2021-01-12 NOTE — Progress Notes (Signed)
Cardiology Office Note:    Date:  01/12/2021   ID:  Lonnie Boyd, DOB Sep 17, 1940, MRN [id number]  PCP:  [name], MD  Cardiologist:  Jenne Campus, MD    Referring MD: [name], MD   Chief Complaint  Patient presents with   Follow-up  Doing well  History of Present Illness:    Lonnie Boyd is a 80 y.o. male  is a patient of mine with very complex past medical history.  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  Cardiac catheterization at the time did not show any significant coronary artery disease.  Apparently in the summer of this year in July 2020 he suffered from acute CVA.  CTA of his neck and neck showed bilateral moderate stenosis of internal carotic arteries.  MRA showed scattered right MCA infarct.  He came to [facility] at the beginning of September with chest pain.  His EKG showed already Q waves anteriorly with ST segment elevations.  Since he was asymptomatic he was not rushed to cardiac catheterization.  Eventually cardiac catheterization being done which showed 45% stenosis mid RCA, distal RCA got 30% stenosis, proximal and mid circumflex lesion up to 40% second obtuse marginal branch had 40% stenosis ramus had 80% stenosis previously stented LAD a it at mid and proximal portion was still patent.  No intervention has been required.  At the same time he was found to have cardiomyopathy which is ischemic in origin with ejection fraction 30 to 35%.  He is coming today to my office for follow-up Today he tells me he is doing fine he comes to my office with his son.  He denies have any chest pain, tightness, pressure, burning in the chest.  He still walks and does a lot of things.  Again he is asked me about driving which I recommended not to.  Past Medical History:  Diagnosis Date   Acute  blood loss anemia    Adenocarcinoma of kidney (HCC)    AKI (acute kidney injury) ([place])    Anemia-HEME positive    Anxiety    CAD (coronary artery disease)    CAD- prior PCIs- cath- moderate CAD 09/22/16    Cardiac arrest with ventricular fibrillation ([place])    Cardiomyopathy ([place])    Chronic GERD    Chronic insomnia    Chronic obstructive asthma with acute exacerbation (HCC)    Dementia ([place]) 09/04/2019   Dizziness 06/23/2017   Dyslipidemia 05/15/2015   Falls 05/24/2017   Gastrointestinal hemorrhage    Hyperlipemia    Ischemic cardiomyopathy    Late onset 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 (HCC)    Multiple closed fractures of ribs of both sides    Myocardial infarct [facility])    With stent to LAD/CX   NSTEMI (non-ST elevated myocardial infarction) ([place]) 09/20/2016   OSA (obstructive sleep apnea) 05/04/2019   PAF (paroxysmal atrial fibrillation) ([place]) 09/25/2016   Permanent atrial fibrillation ([place]) 03/01/2019   Torsades de pointes    Ventral hernia     Past Surgical History:  Procedure Laterality Date   CATARACT EXTRACTION Bilateral 2019   INGUINAL HERNIA REPAIR Bilateral    LEFT HEART CATH AND CORONARY ANGIOGRAPHY N/A 09/20/2016   Procedure: LEFT HEART CATH AND CORONARY ANGIOGRAPHY;  Surgeon: Belva Crome, MD;  Location: [place] [facility];  Service: Cardiovascular;  Laterality: N/A;   LEFT HEART CATH AND CORONARY ANGIOGRAPHY N/A 09/30/2020   Procedure: LEFT HEART CATH AND CORONARY ANGIOGRAPHY;  Surgeon: Lorretta Harp, MD;  Location: [place] [facility];  Service: Cardiovascular;  Laterality: N/A;   PARTIAL NEPHRECTOMY Left [facility]   TONSILLECTOMY  1610   UMBILICAL HERNIA REPAIR  02/2017    Current Medications: Current Meds  Medication Sig   albuterol (PROVENTIL) (2.5 MG/3ML) 0.083% nebulizer solution Take 2.5 mg by nebulization every 4 (four) hours as needed for shortness of breath.   albuterol (VENTOLIN HFA) 108  (90 Base) MCG/ACT inhaler Inhale 2 puffs into the lungs every 6 (six) hours as needed for wheezing or shortness of breath.   ALPRAZolam (XANAX) 0.25 MG tablet Take 0.25 mg by mouth 2 (two) times daily as needed for anxiety or sleep. for anxiety   atorvastatin (LIPITOR) 80 MG tablet Take 80 mg by mouth daily.   clopidogrel (PLAVIX) 75 MG tablet Take 1 tablet (75 mg total) by mouth daily.   donepezil (ARICEPT) 5 MG tablet Take 5 mg by mouth at bedtime.   ELIQUIS 5 MG TABS tablet TAKE ONE TABLET BY MOUTH TWICE DAILY (Patient taking differently: Take 5 mg by mouth 2 (two) times daily.)   empagliflozin (JARDIANCE) 10 MG TABS tablet Take 1 tablet (10 mg total) by mouth daily.   memantine (NAMENDA) 10 MG tablet Take 10 mg by mouth 2 (two) times daily.   nitroGLYCERIN (NITROSTAT) 0.4 MG SL tablet Place 0.4 mg under the tongue every 5 (five) minutes as needed for chest pain.   pantoprazole (PROTONIX) 40 MG tablet Take 40 mg by mouth daily.   sacubitril-valsartan (ENTRESTO) 24-26 MG Take 1 tablet by mouth 2 (two) times daily.   spironolactone (ALDACTONE) 25 MG tablet Take 1 tablet (25 mg total) by mouth daily.     Allergies:   Ace inhibitors   Social History   Socioeconomic History   Marital status: Married    Spouse name: Not on file   Number of children: Not on file   Years of education: Not on file   Highest education level: Not on file  Occupational History   Not on file  Tobacco Use   Smoking status: Never   Smokeless tobacco: Never  Vaping Use   Vaping Use: Never used  Substance and Sexual Activity   Alcohol use: No   Drug use: No   Sexual activity: Not on file  Other Topics Concern   Not on file  Social History Narrative   Not on file   Social Determinants of Health   Financial Resource Strain: Not on file  Food Insecurity: Not on file  Transportation Needs: Not on file  Physical Activity: Not on file  Stress: Not on file  Social Connections: Not on file     Family  History: The patient's family history includes Breast cancer in his mother; Heart failure in his father; Hypertension in his father; Leukemia in his sister; Prostate cancer in his father; Tuberculosis in his father. ROS:   Please see the history of present illness.    All 14 point review of systems negative except as described per history of present illness  EKGs/Labs/Other Studies Reviewed:      Recent Labs: 09/27/2020: ALT 26; B Natriuretic Peptide 558.2 10/01/2020: BUN 26; Creatinine, Ser 1.06; Hemoglobin 15.5; Platelets 267; Potassium 4.4; Sodium 135  Recent Lipid Panel    Component  Value Date/Time   CHOL 193 09/28/2020 0502   TRIG 88 09/28/2020 0502   HDL 32 (L) 09/28/2020 0502   CHOLHDL 6.0 09/28/2020 0502   VLDL 18 09/28/2020 0502   LDLCALC 143 (H) 09/28/2020 0502    Physical Exam:    VS:  BP 124/70 (BP Location: Left Arm, Patient Position: Sitting)    Pulse 75    Ht 5\' 8"  (1.727 m)    Wt 149 lb 3.2 oz (67.7 kg)    SpO2 94%    BMI 22.69 kg/m     Wt Readings from Last 3 Encounters:  01/12/21 149 lb 3.2 oz (67.7 kg)  10/28/20 148 lb (67.1 kg)  10/21/20 148 lb 12.8 oz (67.5 kg)     GEN:  Well nourished, well developed in no acute distress HEENT: Normal NECK: No JVD; No carotid bruits LYMPHATICS: No lymphadenopathy CARDIAC: Irregular, no murmurs, no rubs, no gallops RESPIRATORY:  Clear to auscultation without rales, wheezing or rhonchi  ABDOMEN: Soft, non-tender, non-distended MUSCULOSKELETAL:  No edema; No deformity  SKIN: Warm and dry LOWER EXTREMITIES: no swelling NEUROLOGIC:  Alert and oriented x 3 PSYCHIATRIC:  Normal affect   ASSESSMENT:    1. Ischemic cardiomyopathy   2. CAD- prior PCIs- cath- moderate CAD 09/22/16   3. NSTEMI (non-ST elevated myocardial infarction) ([place])   4. PAF (paroxysmal atrial fibrillation) ([place])   5. Late onset 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 ([place])   6. Dyslipidemia    PLAN:    In order of problems listed  above:  Ischemic cardiomyopathy on appropriate medication that he is able to tolerate overall hemodynamically compensated and stable few months later we will repeat echocardiogram Coronary artery disease: Stable.  Last cardiac catheterization reviewed.  No target lesion for intervention.  He is on antiplatelet therapy in form of Plavix which I will continue. Dyslipidemia he is on Lipitor 80 which I will continue last cholesterol profile show me LDL of 93 HDL 38.  We will add Zetia to his medical regimen. Dementia appears to be mild he seems to be cheerful and happy while in the office Paroxysmal atrial fibrillation he is anticoagulated which I will continue.  His heart rate appears to be irregular today also when he was in the emergency room just few weeks ago he was in atrial fibrillation with poorly controlled ventricular rate.  For now we will continue present management.   Medication Adjustments/Labs and Tests Ordered: Current medicines are reviewed at length with the patient today.  Concerns regarding medicines are outlined above.  No orders of the defined types were placed in this encounter.  Medication changes: No orders of the defined types were placed in this encounter.   Signed, Park Liter, MD, [facility] 01/12/2021 2:45 PM    [facility]

## 2021-04-30 ENCOUNTER — Encounter: Payer: Self-pay | Admitting: Adult Health

## 2021-04-30 ENCOUNTER — Ambulatory Visit (INDEPENDENT_AMBULATORY_CARE_PROVIDER_SITE_OTHER): Payer: PPO | Admitting: Adult Health

## 2021-04-30 VITALS — BP 106/69 | HR 67 | Ht 68.0 in | Wt 153.4 lb

## 2021-04-30 DIAGNOSIS — I63411 Cerebral infarction due to embolism of right middle cerebral artery: Secondary | ICD-10-CM | POA: Diagnosis not present

## 2021-04-30 DIAGNOSIS — G301 Alzheimer's disease with late onset: Secondary | ICD-10-CM | POA: Diagnosis not present

## 2021-04-30 DIAGNOSIS — E785 Hyperlipidemia, unspecified: Secondary | ICD-10-CM

## 2021-04-30 DIAGNOSIS — F0280 Dementia in other diseases classified elsewhere without behavioral disturbance: Secondary | ICD-10-CM

## 2021-04-30 DIAGNOSIS — R7303 Prediabetes: Secondary | ICD-10-CM

## 2021-04-30 NOTE — Progress Notes (Signed)
?[facility] ?[address] ?[place]. [place] [address] ?[phone] ? ?     STROKE FOLLOW UP NOTE ? ?Mr. Lonnie Boyd ?Date of Birth:  1940/11/29 ?Medical Record Number:  [id number]  ? ?Reason for Referral: stroke follow up ? ? ? ?SUBJECTIVE: ? ? ?CHIEF COMPLAINT:  ?Chief Complaint  ?Patient presents with  ? Cerebrovascular Accident  ?  Rm 2, 6 month FU, son- Lonnie Boyd  "doing well"  ? ? ?HPI:  ? ?Update 04/30/2021 JM: Patient returns for 70-monthstroke follow-up accompanied by his son, Lonnie Boyd  Overall stable without new stroke/TIA symptoms. Reports cognition has been stable since prior visit - possibly some improvement. Remains on Aricept and Namenda managed by PCP. [facility]  Continues to live with his son and daughter-in-law, able to maintain ADLs independently.  Compliant on Eliquis, Plavix and atorvastatin, denies side effects.  Blood pressure today 106/69.  No new concerns at this time. ? ? ? ? ?History provided for reference purposes only ?Initial visit 10/28/2020 JM: Mr. JLauricellais being seen for hospital follow-up accompanied by his son, Lonnie Boyd  Overall stable from stroke standpoint. Denies new stroke/TIA symptoms. Son reports some worsening of baseline cognition and increased fatigue since stroke. Per son, dx'd with early stage Alzheimers about 1 year ago - some decline since then prior to stroke. Lives with his son, MRonalee Boyd and daughter in law who assist with medications. He was driving short distance prior to stroke - no driving since then per PCP recommendations. He questions possible return to driving. [facility] Denies new stroke/TIA symptoms.  Remains on Eliquis and Plavix as well as atorvastatin without side effects.  Blood pressure today 115/79.  ? ? He was hospitalized on 09/27/2020 for late presentation anterior STEMI.  Evaluated by cardiology underwent cardiac cath but no identified culprit  lesion. Routinely followed by cardiology.  ? ?Stroke admission 08/20/2020 ?Lonnie AGERis a 81year old male with history of CAD/non-STEMI on Plavix, cardiac arrest with V. fib, PAF on Eliquis, cardiomyopathy, dementia, HLD, [facility]SA [facility] left-sided weakness, slurred speech, confusion and left facial droop.  Personally reviewed hospitalization pertinent progress notes, lab work and imaging.  Evaluated by Dr. XErlinda Hong  CT no acute abnormality.  CTA head and neck showed bilateral ICA siphon moderate stenosis.  MRI showed scattered right MCA infarcts.  EF 30 to 35% down from 45 to 50% in 11/2019.  UDS negative.  LDL 126, A1c 6.5.  No prior DM diagnosis.  Creatinine 1.2.  Etiology for stroke likely due to A. fib with Eliquis noncompliance.  Recommended resuming home Eliquis and Plavix as well as initiated aspirin and increased atorvastatin from 40 mg to 80 mg daily.  History of dementia on Aricept and Namenda which was resumed.  Recommended follow-up outpatient cardiology for decreased EF.  Per exam, no focal deficit except HOH.  PT/OT no therapy needs and discharged back home. ? ? ? ? ? ?PERTINENT IMAGING ? ?CT HEAD 08/20/2020 ?IMPRESSION: ?1. Mildly degraded by motion with no acute cortically based infarct ?or acute intracranial hemorrhage identified. ASPECTS 10. ?2. These results were communicated to Dr. LCheral Markerat 9:15 am on ?08/20/2020 by text page via the [facility]. ? ?CTA HEAD/NECK  08/20/2020 ?IMPRESSION: ?1. Negative for large vessel occlusion. ?2. Mild atherosclerosis in the neck and no significant extracranial ?stenosis. But moderate bilateral supraclinoid ICA stenosis due to ?calcified plaque. ? ?  MR BRAIN 08/20/2020 ?MR ANGIO ?IMPRESSION: ?MRI HEAD IMPRESSION: ?1. Patchy small volume acute ischemic nonhemorrhagic right parietal ?infarcts, posterior right MCA distribution. These are likely embolic ?in nature. ?2. Underlying age-related cerebral atrophy with mild chronic  small ?vessel ischemic disease. ?  ?MRA HEAD IMPRESSION: ?1. Negative intracranial MRA for large vessel occlusion. ?2. Mild to moderate atherosclerotic stenosis involving the ?supraclinoid left ICA. No other hemodynamically significant or ?correctable stenosis. ? ?2D ECHO 08/21/2020 ?IMPRESSIONS  ? 1. Severe hypokinesis of the posterior wall. The mid and distal inferior  ?segments are severely hypokinetic as well. Left ventricular ejection  ?fraction, by estimation, is 30 to 35%. Left ventricular ejection fraction  ?by 3D volume is 32 %. The left  ?ventricle has moderately decreased function. The left ventricle  ?demonstrates global hypokinesis. There is moderate concentric left  ?ventricular hypertrophy. Left ventricular diastolic function could not be  ?evaluated.  ? 2. Right ventricular systolic function is normal. The right ventricular  ?size is normal. There is normal pulmonary artery systolic pressure. The  ?estimated right ventricular systolic pressure is 90.2 mmHg.  ? 3. The mitral valve is grossly normal. Mild mitral valve regurgitation.  ?No evidence of mitral stenosis.  ? 4. The aortic valve is tricuspid. Aortic valve regurgitation is mild. No  ?aortic stenosis is present.  ? 5. The inferior vena cava is normal in size with greater than 50%  ?respiratory variability, suggesting right atrial pressure of 3 mmHg.  ? ? ? ? ?ROS:   ?14 system review of systems performed and negative with exception of no complaints ? ?PMH:  ?Past Medical History:  ?Diagnosis Date  ? Acute blood loss anemia   ? Adenocarcinoma of kidney ([place])   ? AKI (acute kidney injury) ([place])   ? Anemia-HEME positive   ? Anxiety   ? CAD (coronary artery disease)   ? CAD- prior PCIs- cath- moderate CAD 09/22/16   ? Cardiac arrest with ventricular fibrillation ([place])   ? Cardiomyopathy ([place])   ? Chronic GERD   ? Chronic insomnia   ? Chronic obstructive asthma with acute exacerbation ([place])   ? Dementia ([place]) 09/04/2019  ? Dizziness 06/23/2017  ?  Dyslipidemia 05/15/2015  ? Falls 05/24/2017  ? Gastrointestinal hemorrhage   ? Hyperlipemia   ? Ischemic cardiomyopathy   ? Late onset 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 ([place])   ? Multiple closed fractures of ribs of both sides   ? Myocardial infarct [facility])   ? With stent to LAD/CX  ? NSTEMI (non-ST elevated myocardial infarction) ([place]) 09/20/2016  ? OSA (obstructive sleep apnea) 05/04/2019  ? PAF (paroxysmal atrial fibrillation) ([place]) 09/25/2016  ? Permanent atrial fibrillation ([place]) 03/01/2019  ? Stroke [facility])   ? Torsades de pointes ([place])   ? Ventral hernia   ? ? ?PSH:  ?Past Surgical History:  ?Procedure Laterality Date  ? CATARACT EXTRACTION Bilateral 2019  ? INGUINAL HERNIA REPAIR Bilateral   ? LEFT HEART CATH AND CORONARY ANGIOGRAPHY N/A 09/20/2016  ? Procedure: LEFT HEART CATH AND CORONARY ANGIOGRAPHY;  Surgeon: Belva Crome, MD;  Location: [place] [facility];  Service: Cardiovascular;  Laterality: N/A;  ? LEFT HEART CATH AND CORONARY ANGIOGRAPHY N/A 09/30/2020  ? Procedure: LEFT HEART CATH AND CORONARY ANGIOGRAPHY;  Surgeon: Lorretta Harp, MD;  Location: [place] [facility];  Service: Cardiovascular;  Laterality: N/A;  ? PARTIAL NEPHRECTOMY Left 2005  ? [facility]  ? TONSILLECTOMY  1945  ? UMBILICAL HERNIA REPAIR  02/2017  ? ? ?  Social History:  ?Social History  ? ?Socioeconomic History  ? Marital status: Married  ?  Spouse name: Not on file  ? Number of children: Not on file  ? Years of education: Not on file  ? Highest education level: Not on file  ?Occupational History  ? Not on file  ?Tobacco Use  ? Smoking status: Never  ? Smokeless tobacco: Never  ?Vaping Use  ? Vaping Use: Never used  ?Substance and Sexual Activity  ? Alcohol use: No  ? Drug use: No  ? Sexual activity: Not on file  ?Other Topics Concern  ? Not on file  ?Social History Narrative  ? 04/30/21 lives with family  ? ?Social Determinants of Health  ? ?Financial Resource Strain: Not on file  ?Food  Insecurity: Not on file  ?Transportation Needs: Not on file  ?Physical Activity: Not on file  ?Stress: Not on file  ?Social Connections: Not on file  ?Intimate Partner Violence: Not on file  ? ? ?Family History:

## 2021-04-30 NOTE — Patient Instructions (Addendum)
Continue clopidogrel 75 mg daily and Eliquis (apixaban) daily  and atorvastatin  for secondary stroke prevention ? ?We will check your cholesterol levels and A1c today ? ?Continue to follow up with PCP regarding cholesterol, blood pressure and pre-diabetes management  ?Maintain strict control of hypertension with blood pressure goal below 130/90, diabetes with hemoglobin A1c goal below 7.0 % and cholesterol with LDL cholesterol (bad cholesterol) goal below 70 mg/dL.  ? ?Signs of a Stroke? Follow the BEFAST method:  ?Balance Watch for a sudden loss of balance, trouble with coordination or vertigo ?Eyes Is there a sudden loss of vision in one or both eyes? Or double vision?  ?Face: Ask the person to smile. Does one side of the face droop or is it numb?  ?Arms: Ask the person to raise both arms. Does one arm drift downward? Is there weakness or numbness of a leg? ?Speech: Ask the person to repeat a simple phrase. Does the speech sound slurred/strange? Is the person confused ? ?Time: If you observe any of these signs, call 911. ? ? ? ? ? ? ? ?Thank you for coming to see [place] at [facility]. I hope we have been able to provide you high quality care today. ? ?You may receive a patient satisfaction survey over the next few weeks. We would appreciate your feedback and comments so that we may continue to improve ourselves and the health of our patients. ? ?

## 2021-05-01 LAB — LIPID PANEL
Chol/HDL Ratio: 3.7 ratio (ref 0.0–5.0)
Cholesterol, Total: 114 mg/dL (ref 100–199)
HDL: 31 mg/dL — ABNORMAL LOW (ref >39)
LDL Chol Calc (NIH): 62 mg/dL (ref 0–99)
Triglycerides: 113 mg/dL (ref 0–149)
VLDL Cholesterol Cal: 21 mg/dL (ref 5–40)

## 2021-05-01 LAB — HEMOGLOBIN A1C
Est. average glucose Bld gHb Est-mCnc: 131 mg/dL
Hgb A1c MFr Bld: 6.2 % — ABNORMAL HIGH (ref 4.8–5.6)

## 2021-06-15 ENCOUNTER — Encounter: Payer: Self-pay | Admitting: Cardiology

## 2021-06-15 ENCOUNTER — Ambulatory Visit (INDEPENDENT_AMBULATORY_CARE_PROVIDER_SITE_OTHER): Payer: No Typology Code available for payment source | Admitting: Cardiology

## 2021-06-15 VITALS — BP 112/70 | HR 73 | Ht 68.0 in | Wt 151.6 lb

## 2021-06-15 DIAGNOSIS — E782 Mixed hyperlipidemia: Secondary | ICD-10-CM

## 2021-06-15 DIAGNOSIS — J441 Chronic obstructive pulmonary disease with (acute) exacerbation: Secondary | ICD-10-CM

## 2021-06-15 DIAGNOSIS — I255 Ischemic cardiomyopathy: Secondary | ICD-10-CM | POA: Diagnosis not present

## 2021-06-15 DIAGNOSIS — I480 Paroxysmal atrial fibrillation: Secondary | ICD-10-CM | POA: Diagnosis not present

## 2021-06-15 DIAGNOSIS — R0609 Other forms of dyspnea: Secondary | ICD-10-CM

## 2021-06-15 DIAGNOSIS — J45901 Unspecified asthma with (acute) exacerbation: Secondary | ICD-10-CM

## 2021-06-15 DIAGNOSIS — E785 Hyperlipidemia, unspecified: Secondary | ICD-10-CM

## 2021-06-15 NOTE — Addendum Note (Signed)
Addended by: Jacobo Forest D on: 06/15/2021 03:54 PM   Modules accepted: Orders

## 2021-06-15 NOTE — Patient Instructions (Signed)

## 2021-06-15 NOTE — Progress Notes (Signed)
Cardiology Office Note:    Date:  06/15/2021   ID:  MICKIE KOZIKOWSKI, DOB Jun 25, 1940, MRN [id number]  PCP:  [name], MD  Cardiologist:  Jenne Campus, MD    Referring MD: [name], MD   Chief Complaint  Patient presents with   Follow-up  Doing well  History of Present Illness:    Lonnie Boyd is a 81 y.o. male   is a patient of mine with very complex past medical history.  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  Cardiac catheterization at the time did not show any significant coronary artery disease.  Apparently in the summer of this year in July 2020 he suffered from acute CVA.  CTA of his neck and neck showed bilateral moderate stenosis of internal carotic arteries.  MRA showed scattered right MCA infarct.  He came to [facility] at the beginning of September with chest pain.  His EKG showed already Q waves anteriorly with ST segment elevations.  Since he was asymptomatic he was not rushed to cardiac catheterization.  Eventually cardiac catheterization being done which showed 45% stenosis mid RCA, distal RCA got 30% stenosis, proximal and mid circumflex lesion up to 40% second obtuse marginal branch had 40% stenosis ramus had 80% stenosis previously stented LAD a it at mid and proximal portion was still patent.  No intervention has been required.  At the same time he was found to have cardiomyopathy which is ischemic in origin with ejection fraction 30 to 35%.  He is coming today to my office for follow-up He comes today to my office for follow-up overall he seems to be doing well.  He denies have any chest pain tightness squeezing pressure burning chest.  [facility] to senior center and he participate in activity with no difficulties.  Past Medical History:  Diagnosis Date   Acute blood loss anemia     Adenocarcinoma of kidney (HCC)    AKI (acute kidney injury) ([place])    Anemia-HEME positive    Anxiety    CAD (coronary artery disease)    CAD- prior PCIs- cath- moderate CAD 09/22/16    Cardiac arrest with ventricular fibrillation (HCC)    Cardiomyopathy ([place])    Chronic GERD    Chronic insomnia    Chronic obstructive asthma with acute exacerbation (HCC)    Dementia ([place]) 09/04/2019   Dizziness 06/23/2017   Dyslipidemia 05/15/2015   Falls 05/24/2017   Gastrointestinal hemorrhage    Hyperlipemia    Ischemic cardiomyopathy    Late onset 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 (HCC)    Multiple closed fractures of ribs of both sides    Myocardial infarct [facility])    With stent to LAD/CX   NSTEMI (non-ST elevated myocardial infarction) ([place]) 09/20/2016   OSA (obstructive sleep apnea) 05/04/2019   PAF (paroxysmal atrial fibrillation) ([place]) 09/25/2016   Permanent atrial fibrillation ([place]) 03/01/2019   Stroke ([place])    Torsades de pointes ([place])    Ventral hernia     Past Surgical History:  Procedure Laterality Date   CATARACT EXTRACTION Bilateral 2019   INGUINAL HERNIA REPAIR Bilateral    LEFT HEART CATH AND CORONARY ANGIOGRAPHY N/A 09/20/2016   Procedure: LEFT HEART CATH AND CORONARY ANGIOGRAPHY;  Surgeon: Belva Crome, MD;  Location: [place] [facility];  Service: Cardiovascular;  Laterality: N/A;   LEFT HEART CATH AND CORONARY ANGIOGRAPHY N/A 09/30/2020   Procedure: LEFT HEART CATH AND CORONARY ANGIOGRAPHY;  Surgeon: Lorretta Harp, MD;  Location: [place] [facility];  Service: Cardiovascular;  Laterality: N/A;   PARTIAL NEPHRECTOMY Left [facility]   TONSILLECTOMY  2440   UMBILICAL HERNIA REPAIR  02/2017    Current Medications: Current Meds  Medication Sig   albuterol (PROVENTIL) (2.5 MG/3ML) 0.083% nebulizer solution Take 2.5 mg by nebulization every 4 (four) hours as needed for shortness of breath.   albuterol (VENTOLIN HFA)  108 (90 Base) MCG/ACT inhaler Inhale 2 puffs into the lungs every 6 (six) hours as needed for wheezing or shortness of breath.   ALPRAZolam (XANAX) 0.25 MG tablet Take 0.25 mg by mouth 2 (two) times daily as needed for anxiety or sleep. for anxiety   atorvastatin (LIPITOR) 80 MG tablet Take 80 mg by mouth daily.   clopidogrel (PLAVIX) 75 MG tablet Take 1 tablet (75 mg total) by mouth daily.   donepezil (ARICEPT) 5 MG tablet Take 5 mg by mouth at bedtime.   ELIQUIS 5 MG TABS tablet TAKE ONE TABLET BY MOUTH TWICE DAILY (Patient taking differently: Take 5 mg by mouth 2 (two) times daily.)   empagliflozin (JARDIANCE) 10 MG TABS tablet Take 1 tablet (10 mg total) by mouth daily.   memantine (NAMENDA) 10 MG tablet Take 10 mg by mouth 2 (two) times daily.   nitroGLYCERIN (NITROSTAT) 0.4 MG SL tablet Place 0.4 mg under the tongue every 5 (five) minutes as needed for chest pain.   pantoprazole (PROTONIX) 40 MG tablet Take 40 mg by mouth daily.   sacubitril-valsartan (ENTRESTO) 24-26 MG Take 1 tablet by mouth 2 (two) times daily.   spironolactone (ALDACTONE) 25 MG tablet Take 1 tablet (25 mg total) by mouth daily.     Allergies:   Ace inhibitors   Social History   Socioeconomic History   Marital status: Married    Spouse name: Not on file   Number of children: Not on file   Years of education: Not on file   Highest education level: Not on file  Occupational History   Not on file  Tobacco Use   Smoking status: Never   Smokeless tobacco: Never  Vaping Use   Vaping Use: Never used  Substance and Sexual Activity   Alcohol use: No   Drug use: No   Sexual activity: Not on file  Other Topics Concern   Not on file  Social History Narrative   04/30/21 lives with family   Social Determinants of Health   Financial Resource Strain: Not on file  Food Insecurity: Not on file  Transportation Needs: Not on file  Physical Activity: Not on file  Stress: Not on file  Social Connections: Not on file      Family History: The patient's family history includes Breast cancer in his mother; Heart failure in his father; Hypertension in his father; Leukemia in his sister; Prostate cancer in his father; Tuberculosis in his father. ROS:   Please see the history of present illness.    All 14 point review of systems negative except as described per history of present illness  EKGs/Labs/Other Studies Reviewed:      Recent Labs: 09/27/2020: ALT 26; B Natriuretic Peptide 558.2 10/01/2020: BUN 26; Creatinine, Ser 1.06; Hemoglobin 15.5; Platelets 267; Potassium 4.4; Sodium 135  Recent Lipid Panel    Component Value Date/Time  CHOL 114 04/30/2021 1015   TRIG 113 04/30/2021 1015   HDL 31 (L) 04/30/2021 1015   CHOLHDL 3.7 04/30/2021 1015   CHOLHDL 6.0 09/28/2020 0502   VLDL 18 09/28/2020 0502   LDLCALC 62 04/30/2021 1015    Physical Exam:    VS:  BP 112/70 (BP Location: Left Arm, Patient Position: Sitting)   Pulse 73   Ht '5\' 8"'$  (1.727 m)   Wt 151 lb 9.6 oz (68.8 kg)   SpO2 94%   BMI 23.05 kg/m     Wt Readings from Last 3 Encounters:  06/15/21 151 lb 9.6 oz (68.8 kg)  04/30/21 153 lb 6.4 oz (69.6 kg)  01/12/21 149 lb 3.2 oz (67.7 kg)     GEN:  Well nourished, well developed in no acute distress HEENT: Normal NECK: No JVD; No carotid bruits LYMPHATICS: No lymphadenopathy CARDIAC: RRR, no murmurs, no rubs, no gallops RESPIRATORY:  Clear to auscultation without rales, wheezing or rhonchi  ABDOMEN: Soft, non-tender, non-distended MUSCULOSKELETAL:  No edema; No deformity  SKIN: Warm and dry LOWER EXTREMITIES: no swelling NEUROLOGIC:  Alert and oriented x 3 PSYCHIATRIC:  Normal affect   ASSESSMENT:    1. Ischemic cardiomyopathy   2. PAF (paroxysmal atrial fibrillation) ([place])   3. Chronic obstructive asthma with acute exacerbation ([place])   4. Mixed hyperlipidemia   5. Dyslipidemia    PLAN:    In order of problems listed above:  Ischemic cardiomyopathy.  We will schedule him  to have another echocardiogram done he supposed have it done but for some reason did not happen.  He is on guideline directed medical therapy will see the results of the therapy Paroxysmal atrial fibrillation, he is anticoagulated which I will continue.  Denies have any palpitations COPD stable. Mixed dyslipidemia: I did review K PN which show me his LDL of 62 HDL 31 he is taking Lipitor 80 which I will continue. Dementia he is on Namenda and Aricept seems to be stable his son is with him.   Medication Adjustments/Labs and Tests Ordered: Current medicines are reviewed at length with the patient today.  Concerns regarding medicines are outlined above.  No orders of the defined types were placed in this encounter.  Medication changes: No orders of the defined types were placed in this encounter.   Signed, Park Liter, MD, [facility] 06/15/2021 3:48 PM    [place]

## 2021-06-19 ENCOUNTER — Other Ambulatory Visit: Payer: PPO

## 2021-06-30 ENCOUNTER — Ambulatory Visit (INDEPENDENT_AMBULATORY_CARE_PROVIDER_SITE_OTHER): Payer: No Typology Code available for payment source

## 2021-06-30 DIAGNOSIS — R0609 Other forms of dyspnea: Secondary | ICD-10-CM

## 2021-06-30 LAB — ECHOCARDIOGRAM COMPLETE
Area-P 1/2: 5.97 cm2
MV M vel: 4.38 m/s
MV Peak grad: 76.7 mmHg
P 1/2 time: 576 msec
Radius: 0.3 cm
S' Lateral: 3.8 cm

## 2021-07-03 ENCOUNTER — Telehealth: Payer: Self-pay

## 2021-07-03 ENCOUNTER — Telehealth: Payer: Self-pay | Admitting: Cardiology

## 2021-07-03 NOTE — Telephone Encounter (Signed)
Patient's son calling to go over patient's recent appointment.

## 2021-07-03 NOTE — Telephone Encounter (Signed)
LVM returning call to son- Lonnie Boyd

## 2021-07-06 ENCOUNTER — Telehealth: Payer: Self-pay

## 2021-07-06 NOTE — Telephone Encounter (Signed)
Results reviewed with pt as per Dr. Munley's note.  Pt verbalized understanding and had no additional questions. Routed to PCP  

## 2022-02-06 IMAGING — CT CT HEAD CODE STROKE
3 of 6 series · 14 of 47 positions shown, 17 images · non-contrast
Comparison: Head CT 05/18/2017.

CLINICAL DATA: Code stroke.  80-year-old male.

EXAM:
CT HEAD WITHOUT CONTRAST
TECHNIQUE: Contiguous axial images were obtained from the base of the skull
through the vertex without intravenous contrast.

[Series 3: head 5.0 st · axial · 0.48mm/px · z∈[+63,+198]mm · 9 of 31 slices shown, 12 images]
[im 2/31  brain]
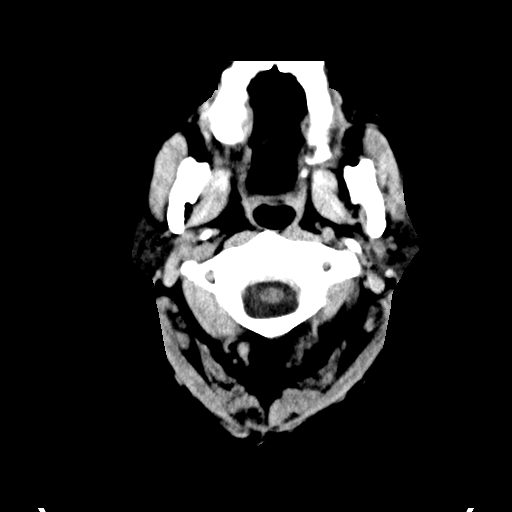
[im 2/31  bone]
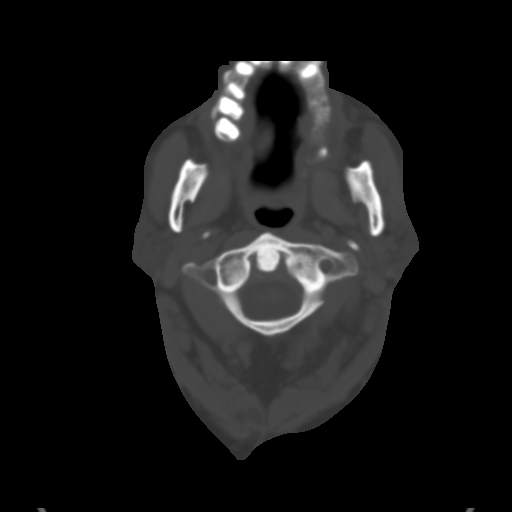
[im 6/31  brain]
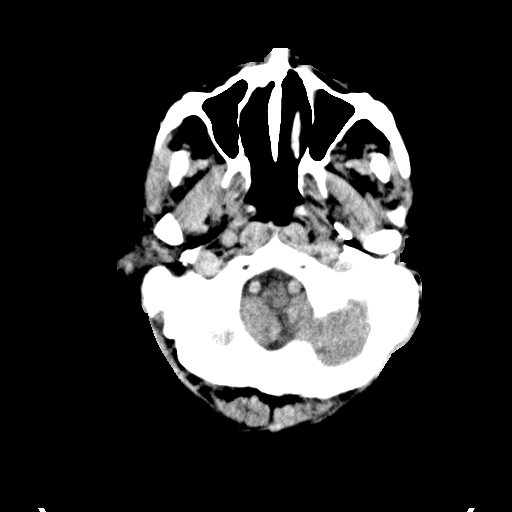
[im 9/31  brain]
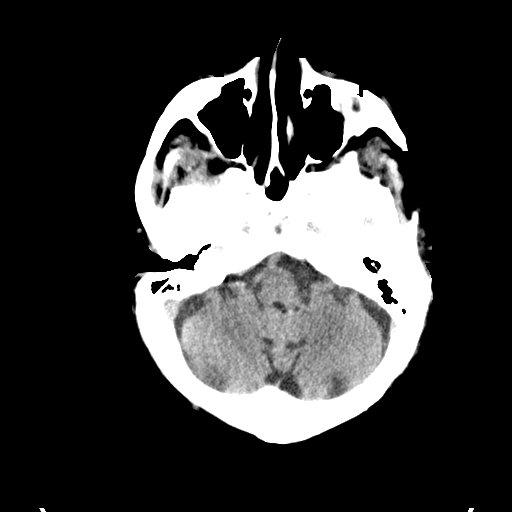
[im 12/31  brain]
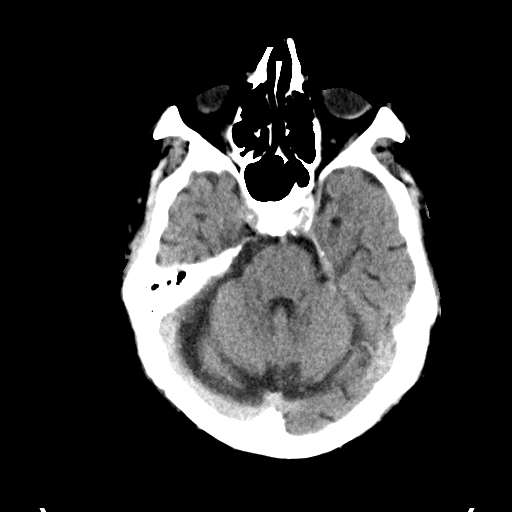
[im 16/31  brain]
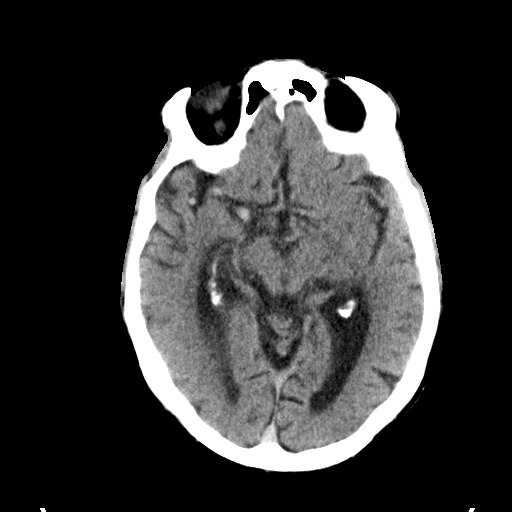
[im 16/31  bone]
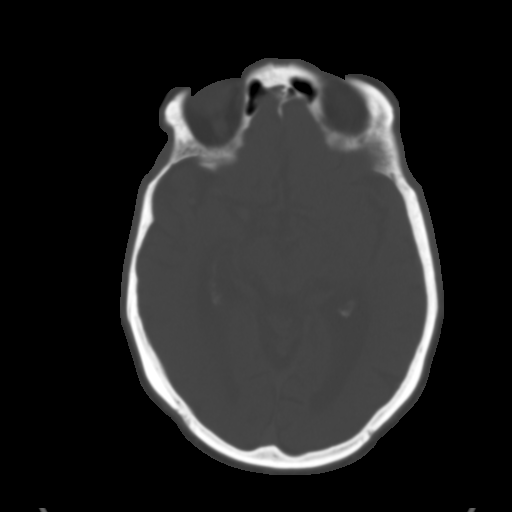
[im 19/31  brain]
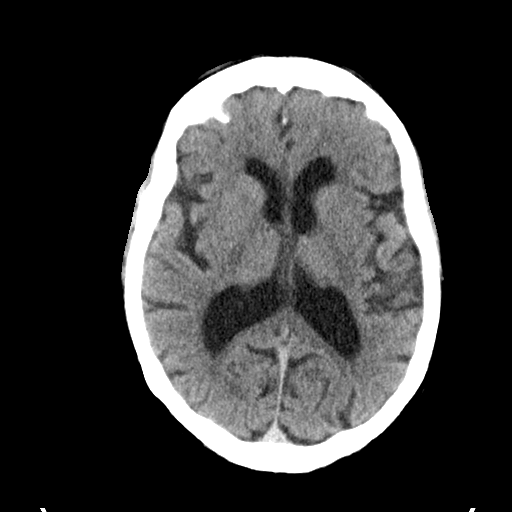
[im 22/31  brain]
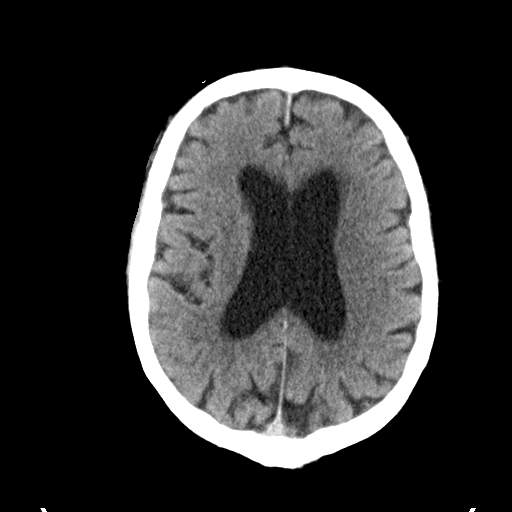
[im 26/31  brain]
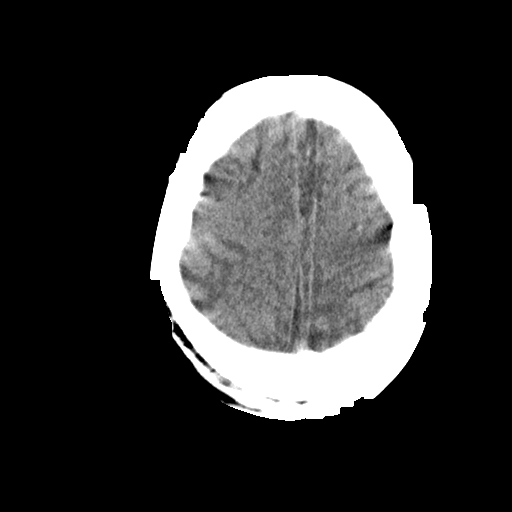
[im 29/31  brain]
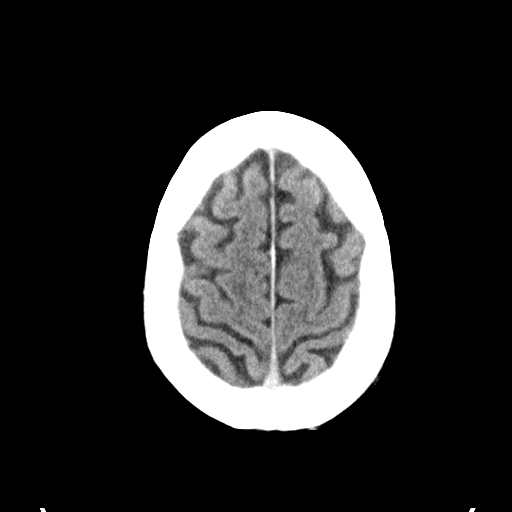
[im 29/31  bone]
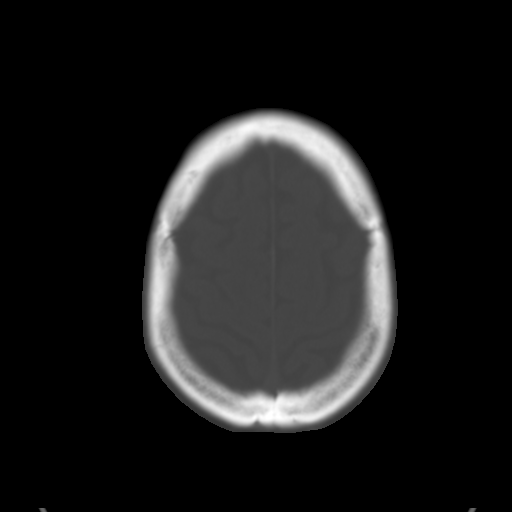

[Series 6: head 3.0 cor st · coronal · 0.31mm/px · 3 of 71 slices shown]
[im 18/71  brain]
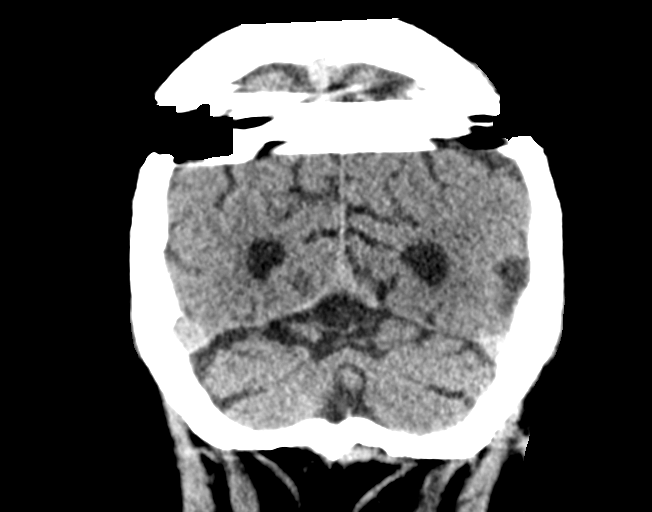
[im 36/71  brain]
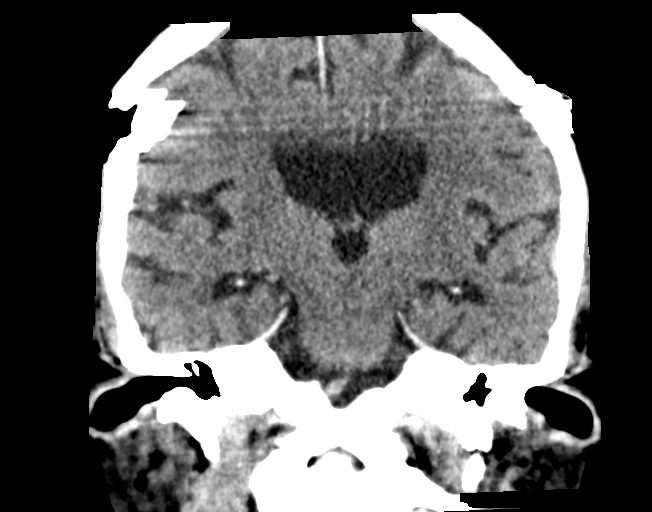
[im 53/71  brain]
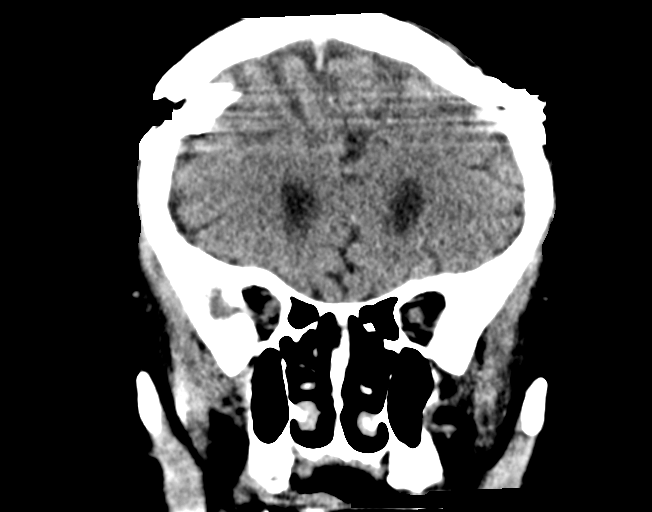

[Series 10: head 3.0 sag st · sagittal · 0.23mm/px · 2 of 67 slices shown]
[im 23/67  brain]
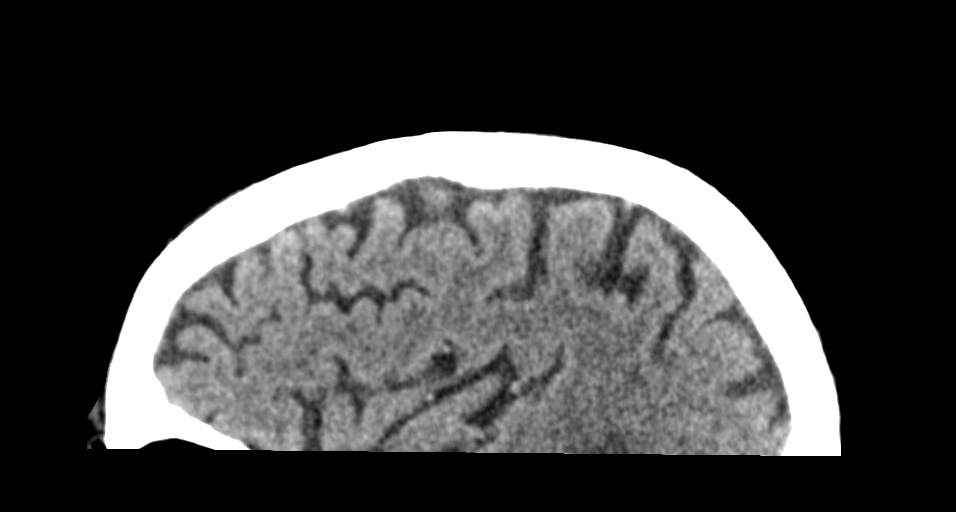
[im 45/67  brain]
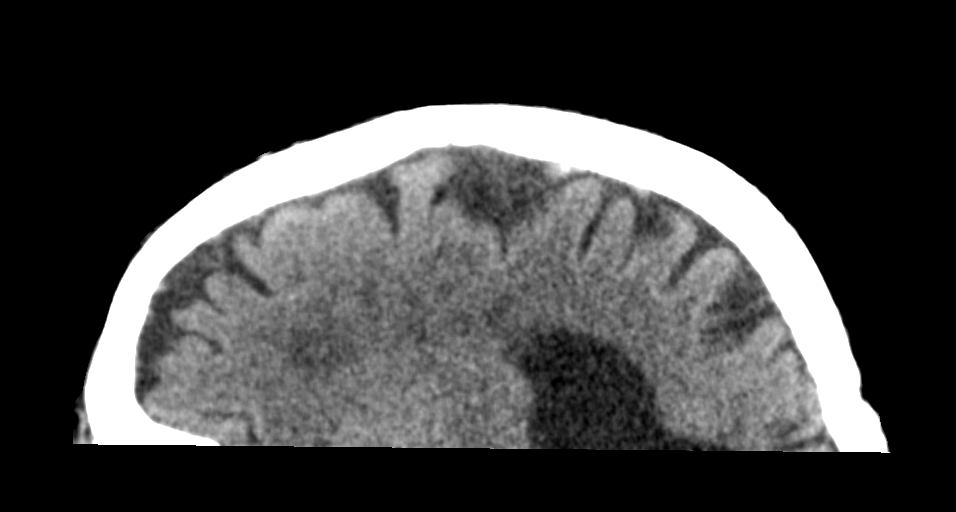

[14 of 47 positions shown; findings below may reference images not displayed]

FINDINGS: Study is mildly degraded by motion artifact despite repeated imaging
attempts.

Brain: Cerebral volume is stable since 0903. No midline shift,
ventriculomegaly, mass effect, evidence of mass lesion, intracranial
hemorrhage or evidence of cortically based acute infarction.
Gray-white matter differentiation remains within normal limits for
age. Minimal white matter hypodensity for age. No cortical
encephalomalacia identified.

Vascular: Calcified atherosclerosis at the skull base. No suspicious
intracranial vascular hyperdensity.

Skull: No acute osseous abnormality identified.

Sinuses/Orbits: Visualized paranasal sinuses and mastoids are stable
and well aerated.

Other: Visualized orbits and scalp soft tissues are within normal
limits.

ASPECTS ([place] Stroke Program Early CT Score)

Total score (0-10 with 10 being normal): 10
IMPRESSION: 1. Mildly degraded by motion with no acute cortically based infarct
or acute intracranial hemorrhage identified. ASPECTS 10.
2. These results were communicated to Dr. Kikawi at [DATE] on
08/20/2020 by text page via the [employer] messaging system.

## 2022-04-02 DIAGNOSIS — I351 Nonrheumatic aortic (valve) insufficiency: Secondary | ICD-10-CM | POA: Diagnosis not present

## 2022-04-09 ENCOUNTER — Ambulatory Visit: Payer: No Typology Code available for payment source | Attending: Cardiology | Admitting: Cardiology

## 2022-04-12 ENCOUNTER — Encounter: Payer: Self-pay | Admitting: Cardiology

## 2023-12-26 DEATH — deceased
